# Patient Record
Sex: Male | Born: 1940 | Marital: Married | Smoking: Never smoker
Health system: Southern US, Community
[De-identification: ages and names within clinical notes are randomized; demographics above are authoritative.]

## PROBLEM LIST (undated history)

## (undated) DIAGNOSIS — I10 Essential (primary) hypertension: Secondary | ICD-10-CM

## (undated) DIAGNOSIS — I4891 Unspecified atrial fibrillation: Secondary | ICD-10-CM

## (undated) HISTORY — DX: Unspecified atrial fibrillation: I48.91

## (undated) HISTORY — PX: DENTAL SURGERY: SHX609

## (undated) HISTORY — DX: Essential (primary) hypertension: I10

---

## 2016-02-27 DIAGNOSIS — I340 Nonrheumatic mitral (valve) insufficiency: Secondary | ICD-10-CM | POA: Diagnosis not present

## 2016-03-03 DIAGNOSIS — I340 Nonrheumatic mitral (valve) insufficiency: Secondary | ICD-10-CM | POA: Diagnosis not present

## 2016-03-03 DIAGNOSIS — Z720 Tobacco use: Secondary | ICD-10-CM | POA: Diagnosis not present

## 2016-03-03 DIAGNOSIS — I482 Chronic atrial fibrillation: Secondary | ICD-10-CM | POA: Diagnosis not present

## 2016-03-26 DIAGNOSIS — Z23 Encounter for immunization: Secondary | ICD-10-CM | POA: Diagnosis not present

## 2016-08-08 DIAGNOSIS — K1329 Other disturbances of oral epithelium, including tongue: Secondary | ICD-10-CM | POA: Diagnosis not present

## 2016-08-15 DIAGNOSIS — K1379 Other lesions of oral mucosa: Secondary | ICD-10-CM | POA: Diagnosis not present

## 2016-08-15 DIAGNOSIS — K1329 Other disturbances of oral epithelium, including tongue: Secondary | ICD-10-CM | POA: Diagnosis not present

## 2016-09-02 DIAGNOSIS — D1801 Hemangioma of skin and subcutaneous tissue: Secondary | ICD-10-CM | POA: Diagnosis not present

## 2016-09-02 DIAGNOSIS — L814 Other melanin hyperpigmentation: Secondary | ICD-10-CM | POA: Diagnosis not present

## 2016-09-02 DIAGNOSIS — L739 Follicular disorder, unspecified: Secondary | ICD-10-CM | POA: Diagnosis not present

## 2016-09-02 DIAGNOSIS — L821 Other seborrheic keratosis: Secondary | ICD-10-CM | POA: Diagnosis not present

## 2016-09-29 DIAGNOSIS — E669 Obesity, unspecified: Secondary | ICD-10-CM | POA: Diagnosis not present

## 2016-09-29 DIAGNOSIS — I340 Nonrheumatic mitral (valve) insufficiency: Secondary | ICD-10-CM | POA: Diagnosis not present

## 2016-09-29 DIAGNOSIS — I482 Chronic atrial fibrillation: Secondary | ICD-10-CM | POA: Diagnosis not present

## 2016-10-27 DIAGNOSIS — I482 Chronic atrial fibrillation: Secondary | ICD-10-CM | POA: Diagnosis not present

## 2016-11-20 DIAGNOSIS — Z13220 Encounter for screening for lipoid disorders: Secondary | ICD-10-CM | POA: Diagnosis not present

## 2016-11-20 DIAGNOSIS — I482 Chronic atrial fibrillation: Secondary | ICD-10-CM | POA: Diagnosis not present

## 2016-11-20 DIAGNOSIS — Z125 Encounter for screening for malignant neoplasm of prostate: Secondary | ICD-10-CM | POA: Diagnosis not present

## 2016-11-20 DIAGNOSIS — Z0000 Encounter for general adult medical examination without abnormal findings: Secondary | ICD-10-CM | POA: Diagnosis not present

## 2016-12-04 DIAGNOSIS — Z23 Encounter for immunization: Secondary | ICD-10-CM | POA: Diagnosis not present

## 2017-02-10 DIAGNOSIS — Z23 Encounter for immunization: Secondary | ICD-10-CM | POA: Diagnosis not present

## 2017-04-20 DIAGNOSIS — E669 Obesity, unspecified: Secondary | ICD-10-CM | POA: Diagnosis not present

## 2017-04-20 DIAGNOSIS — I482 Chronic atrial fibrillation: Secondary | ICD-10-CM | POA: Diagnosis not present

## 2017-04-20 DIAGNOSIS — I340 Nonrheumatic mitral (valve) insufficiency: Secondary | ICD-10-CM | POA: Diagnosis not present

## 2017-04-27 DIAGNOSIS — I4891 Unspecified atrial fibrillation: Secondary | ICD-10-CM | POA: Diagnosis not present

## 2017-05-14 DIAGNOSIS — D3709 Neoplasm of uncertain behavior of other specified sites of the oral cavity: Secondary | ICD-10-CM | POA: Diagnosis not present

## 2017-05-20 DIAGNOSIS — K1329 Other disturbances of oral epithelium, including tongue: Secondary | ICD-10-CM | POA: Diagnosis not present

## 2017-10-14 DIAGNOSIS — I482 Chronic atrial fibrillation: Secondary | ICD-10-CM | POA: Diagnosis not present

## 2017-10-19 DIAGNOSIS — I340 Nonrheumatic mitral (valve) insufficiency: Secondary | ICD-10-CM | POA: Diagnosis not present

## 2017-10-19 DIAGNOSIS — E669 Obesity, unspecified: Secondary | ICD-10-CM | POA: Diagnosis not present

## 2017-10-19 DIAGNOSIS — I482 Chronic atrial fibrillation: Secondary | ICD-10-CM | POA: Diagnosis not present

## 2017-11-30 DIAGNOSIS — Z136 Encounter for screening for cardiovascular disorders: Secondary | ICD-10-CM | POA: Diagnosis not present

## 2017-11-30 DIAGNOSIS — Z0000 Encounter for general adult medical examination without abnormal findings: Secondary | ICD-10-CM | POA: Diagnosis not present

## 2017-11-30 DIAGNOSIS — I482 Chronic atrial fibrillation: Secondary | ICD-10-CM | POA: Diagnosis not present

## 2017-11-30 DIAGNOSIS — Z125 Encounter for screening for malignant neoplasm of prostate: Secondary | ICD-10-CM | POA: Diagnosis not present

## 2017-12-02 DIAGNOSIS — K044 Acute apical periodontitis of pulpal origin: Secondary | ICD-10-CM | POA: Diagnosis not present

## 2017-12-02 DIAGNOSIS — D3709 Neoplasm of uncertain behavior of other specified sites of the oral cavity: Secondary | ICD-10-CM | POA: Diagnosis not present

## 2017-12-09 DIAGNOSIS — F17290 Nicotine dependence, other tobacco product, uncomplicated: Secondary | ICD-10-CM | POA: Diagnosis not present

## 2017-12-09 DIAGNOSIS — K1379 Other lesions of oral mucosa: Secondary | ICD-10-CM | POA: Diagnosis not present

## 2017-12-09 DIAGNOSIS — M278 Other specified diseases of jaws: Secondary | ICD-10-CM | POA: Diagnosis not present

## 2017-12-09 DIAGNOSIS — Z85830 Personal history of malignant neoplasm of bone: Secondary | ICD-10-CM | POA: Diagnosis not present

## 2018-01-21 DIAGNOSIS — I340 Nonrheumatic mitral (valve) insufficiency: Secondary | ICD-10-CM | POA: Diagnosis not present

## 2018-01-21 DIAGNOSIS — F17220 Nicotine dependence, chewing tobacco, uncomplicated: Secondary | ICD-10-CM | POA: Diagnosis not present

## 2018-01-21 DIAGNOSIS — M279 Disease of jaws, unspecified: Secondary | ICD-10-CM | POA: Diagnosis not present

## 2018-01-21 DIAGNOSIS — I10 Essential (primary) hypertension: Secondary | ICD-10-CM | POA: Diagnosis not present

## 2018-01-21 DIAGNOSIS — C061 Malignant neoplasm of vestibule of mouth: Secondary | ICD-10-CM | POA: Diagnosis not present

## 2018-01-21 DIAGNOSIS — Z79899 Other long term (current) drug therapy: Secondary | ICD-10-CM | POA: Diagnosis not present

## 2018-01-21 DIAGNOSIS — C060 Malignant neoplasm of cheek mucosa: Secondary | ICD-10-CM | POA: Diagnosis not present

## 2018-01-21 DIAGNOSIS — I482 Chronic atrial fibrillation, unspecified: Secondary | ICD-10-CM | POA: Diagnosis not present

## 2018-01-21 DIAGNOSIS — D1039 Benign neoplasm of other parts of mouth: Secondary | ICD-10-CM | POA: Diagnosis not present

## 2018-01-21 DIAGNOSIS — Z7901 Long term (current) use of anticoagulants: Secondary | ICD-10-CM | POA: Diagnosis not present

## 2018-02-03 DIAGNOSIS — C760 Malignant neoplasm of head, face and neck: Secondary | ICD-10-CM | POA: Diagnosis not present

## 2018-02-03 DIAGNOSIS — Z483 Aftercare following surgery for neoplasm: Secondary | ICD-10-CM | POA: Diagnosis not present

## 2018-02-15 DIAGNOSIS — Z23 Encounter for immunization: Secondary | ICD-10-CM | POA: Diagnosis not present

## 2018-02-22 DIAGNOSIS — Z4889 Encounter for other specified surgical aftercare: Secondary | ICD-10-CM | POA: Diagnosis not present

## 2018-05-17 DIAGNOSIS — K1379 Other lesions of oral mucosa: Secondary | ICD-10-CM | POA: Diagnosis not present

## 2018-05-17 DIAGNOSIS — Z48817 Encounter for surgical aftercare following surgery on the skin and subcutaneous tissue: Secondary | ICD-10-CM | POA: Diagnosis not present

## 2018-05-17 DIAGNOSIS — Z9889 Other specified postprocedural states: Secondary | ICD-10-CM | POA: Diagnosis not present

## 2018-08-24 ENCOUNTER — Other Ambulatory Visit: Payer: Self-pay | Admitting: Cardiology

## 2018-08-24 DIAGNOSIS — I4821 Permanent atrial fibrillation: Secondary | ICD-10-CM

## 2018-08-24 NOTE — Telephone Encounter (Signed)
Please fill

## 2018-10-21 ENCOUNTER — Encounter: Payer: Self-pay | Admitting: Cardiology

## 2018-10-22 DIAGNOSIS — I340 Nonrheumatic mitral (valve) insufficiency: Secondary | ICD-10-CM | POA: Insufficient documentation

## 2018-10-22 DIAGNOSIS — I4821 Permanent atrial fibrillation: Secondary | ICD-10-CM | POA: Insufficient documentation

## 2018-10-22 DIAGNOSIS — E669 Obesity, unspecified: Secondary | ICD-10-CM | POA: Insufficient documentation

## 2018-10-22 NOTE — Progress Notes (Signed)
Subjective:  Primary Physician:  Farris Has, MD  Patient ID: Robert [name], male    DOB: 22-Jul-1940, 78 y.o.   MRN: [id number]  This visit type was conducted due to national recommendations for restrictions regarding the COVID-19 Pandemic (e.g. social distancing).  This format is felt to be most appropriate for this patient at this time.  All issues noted in this document were discussed and addressed.  No physical exam was performed (except for noted visual exam findings with Telehealth visits - very limited).  The patient has consented to conduct a Telehealth visit and understands insurance will be billed.   I connected with patient, on 10/25/18  by a video enabled telemedicine application and verified that I am speaking with the correct person using two identifiers.     I discussed the limitations of evaluation and management by telemedicine and the availability of in person appointments. The patient expressed understanding and agreed to proceed.   I have discussed with patient regarding the safety during COVID Pandemic and steps and precautions including social distancing with the patient.    Chief Complaint  Patient presents with   Atrial Fibrillation    6 months   HPI: Robert [name]  is a 78 y.o. male. Patient is a very pleasant white male. He has permanent atrial fibrillation. He was first diagnosed to have atrial fibrillation in 2009, when he had TIA (patient had sudden left hemianopia for one hour, no other neurological symptoms).Patient is essentially asymptomatic from cardiac point of view. He does not feel any palpitations or any spells of rapid heartbeat.No history of dizziness, near syncope or syncope. No chest pain, tightness or pressure. No history of shortness of breath, orthopnea or PND. No swelling on the legs. No history of leg claudication. No history of bleeding from any site and no history of excessive bruising. Patient is on Xarelto. He has been tolerating it  well.  No history of hypertension, diabetes or hypercholesterolemia. He does not smoke. He used to chew tobacco, quit in May 2018.  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 No history of thyroid problems.   Past Medical History:  Diagnosis Date   A-fib (HCC)    HTN (hypertension)     Past Surgical History:  Procedure Laterality Date   DENTAL SURGERY      Social History   Socioeconomic History   Marital status: Married    Spouse name: Not on file   Number of children: 2   Years of education: Not on file   Highest education level: Not on file  Occupational History   Not on file  Social Needs   Financial resource strain: Not on file   Food insecurity    Worry: Not on file    Inability: Not on file   Transportation needs    Medical: Not on file    Non-medical: Not on file  Tobacco Use   Smoking status: Never Smoker   Smokeless tobacco: Never Used  Substance and Sexual Activity   Alcohol use: Yes    Comment: occ   Drug use: Never   Sexual activity: Not on file  Lifestyle   Physical activity    Days per week: Not on file    Minutes per session: Not on file   Stress: Not on file  Relationships   Social connections    Talks on phone: Not on file    Gets  together: Not on file    Attends religious service: Not on file    Active member of club or organization: Not on file    Attends meetings of clubs or organizations: Not on file    Relationship status: Not on file   Intimate partner violence    Fear of current or ex partner: Not on file    Emotionally abused: Not on file    Physically abused: Not on file    Forced sexual activity: Not on file  Other Topics Concern   Not on file  Social History Narrative   Not on file    Current Outpatient Medications on File Prior to Visit  Medication Sig Dispense Refill   metoprolol tartrate (LOPRESSOR) 100 MG tablet Take 100 mg  by mouth 2 (two) times daily.     No current facility-administered medications on file prior to visit.     Review of Systems  Constitutional: Negative for fever.  HENT: Negative for nosebleeds.   Eyes: Negative for blurred vision.  Respiratory: Negative for cough and shortness of breath.   Cardiovascular: Negative for chest pain, palpitations and leg swelling.  Gastrointestinal: Negative for abdominal pain, nausea and vomiting.  Genitourinary: Negative for dysuria.  Musculoskeletal: Negative for myalgias.  Skin: Negative for itching and rash.  Neurological: Negative for dizziness, seizures and loss of consciousness.  Psychiatric/Behavioral: The patient is not nervous/anxious.       Objective:  Height 6\' 1"  (1.854 m), weight 239 lb (108.4 kg). Body mass index is 31.53 kg/m.  Physical Exam: Patient is alert and oriented. Well-built and nourished. Appeared very comfortable talking to me during the visit. No further detailed physical examination was possible as it was a telemedicine visit.  CARDIAC STUDIES:  Echocardiogram 10/14/2017: Left ventricle cavity is normal in size. Mild concentric hypertrophy of the left ventricle. Normal global wall motion. Unable to evaluate diastolic function due to A. Fibrillation. Calculated EF 56%. Left atrial cavity is moderately dilated. Moderate (Grade II) mitral regurgitation. Mild tricuspid regurgitation. Estimated pulmonary artery systolic pressure 27 mmHg. The aortic root is mildly dilated, measuring 3.9 cm in AP dimension. Compared to previous study on 02/26/2017, there is mild increase in aortic root dimension. Otherwise, no significant change.  Lexiscan myoview stress test 04/27/2017: 1. Pharmacologic stress testing was performed with intravenous administration of .4 mg of Lexiscan over a 10-15 seconds infusion. Stress EKG is non diagnostic for ischemia as it is a pharmacologic stress. 2. The overall quality of the study is good. Left  ventricular cavity is noted to be normal on the rest and stress studies. Gated SPECT images reveal normal myocardial thickening and wall motion. The left ventricular ejection fraction was calculated or visually estimated to be 68%. SPECT images demonstrate small perfusion abnormality of mild intensity in the mid inferior myocardial wall(s) on the stress images.  3. This is a low risk study.  EKG- 04/26/2018-  Atrial fibrillation with moderate ventricular response, low voltage in chest leads.  Assessment & Recommendations:   1. Permanent atrial fibrillation  2. Non-rheumatic mitral regurgitation  3. Obesity (BMI 30-39.9)  Laboratory Exam:  Lipid Panel   Recommendation:  Ventricular response is controlled with atrial fibrillation. I have advised him to continue present medications. Bleeding complications of Xarelto were again explained and patient, he was advised that, if there is any abnormal bleeding, like GI bleed, hematuria etc, he should stop the medicine and call us. He was also advised to avoid aspirin, ibuprofen or other NSAIDs.  Primary  prevention was again discussed with him. He was encouraged to continue calorie restriction to lose more weight. He was also advised to continue low-salt, low-cholesterol diet.  Return for cardiac follow-up after 6 months but call us earlier if there are any problems. Patient said he will have complete  blood tests at his PCP's office next month. He will have echocardiogram before the next office visit for follow-up of MR and aortic root size. Patient requested for the refill order for Xarelto and I have sent it to the pharmacy.  Earl Many, MD, [facility] 10/25/2018, 11:45 AM [facility] Pager: [phone] Office: [phone] If no answer Cell [phone]

## 2018-10-25 ENCOUNTER — Other Ambulatory Visit: Payer: Self-pay

## 2018-10-25 ENCOUNTER — Ambulatory Visit (INDEPENDENT_AMBULATORY_CARE_PROVIDER_SITE_OTHER): Payer: Medicare Other | Admitting: Cardiology

## 2018-10-25 ENCOUNTER — Encounter: Payer: Self-pay | Admitting: Cardiology

## 2018-10-25 VITALS — Ht 73.0 in | Wt 239.0 lb

## 2018-10-25 DIAGNOSIS — I4821 Permanent atrial fibrillation: Secondary | ICD-10-CM | POA: Diagnosis not present

## 2018-10-25 DIAGNOSIS — E669 Obesity, unspecified: Secondary | ICD-10-CM | POA: Diagnosis not present

## 2018-10-25 DIAGNOSIS — I340 Nonrheumatic mitral (valve) insufficiency: Secondary | ICD-10-CM

## 2018-10-25 MED ORDER — RIVAROXABAN 20 MG PO TABS: 20.0000 mg | ORAL_TABLET | Freq: Every day | ORAL | 2 refills | Status: DC

## 2018-12-08 DIAGNOSIS — Z0000 Encounter for general adult medical examination without abnormal findings: Secondary | ICD-10-CM | POA: Diagnosis not present

## 2018-12-08 DIAGNOSIS — Z125 Encounter for screening for malignant neoplasm of prostate: Secondary | ICD-10-CM | POA: Diagnosis not present

## 2018-12-08 DIAGNOSIS — R238 Other skin changes: Secondary | ICD-10-CM | POA: Diagnosis not present

## 2018-12-08 DIAGNOSIS — I482 Chronic atrial fibrillation, unspecified: Secondary | ICD-10-CM | POA: Diagnosis not present

## 2018-12-22 DIAGNOSIS — Z0000 Encounter for general adult medical examination without abnormal findings: Secondary | ICD-10-CM | POA: Diagnosis not present

## 2018-12-22 DIAGNOSIS — I482 Chronic atrial fibrillation, unspecified: Secondary | ICD-10-CM | POA: Diagnosis not present

## 2018-12-22 DIAGNOSIS — Z125 Encounter for screening for malignant neoplasm of prostate: Secondary | ICD-10-CM | POA: Diagnosis not present

## 2018-12-22 DIAGNOSIS — Z136 Encounter for screening for cardiovascular disorders: Secondary | ICD-10-CM | POA: Diagnosis not present

## 2019-01-22 DIAGNOSIS — Z23 Encounter for immunization: Secondary | ICD-10-CM | POA: Diagnosis not present

## 2019-04-26 ENCOUNTER — Other Ambulatory Visit: Payer: Self-pay

## 2019-04-26 ENCOUNTER — Ambulatory Visit (INDEPENDENT_AMBULATORY_CARE_PROVIDER_SITE_OTHER): Payer: Medicare Other

## 2019-04-26 DIAGNOSIS — I340 Nonrheumatic mitral (valve) insufficiency: Secondary | ICD-10-CM

## 2019-05-03 ENCOUNTER — Other Ambulatory Visit: Payer: Self-pay

## 2019-05-03 ENCOUNTER — Ambulatory Visit: Payer: Medicare Other | Admitting: Cardiology

## 2019-05-03 ENCOUNTER — Encounter: Payer: Self-pay | Admitting: Cardiology

## 2019-05-03 ENCOUNTER — Ambulatory Visit (INDEPENDENT_AMBULATORY_CARE_PROVIDER_SITE_OTHER): Payer: Medicare Other | Admitting: Cardiology

## 2019-05-03 VITALS — BP 110/67 | HR 87 | Temp 98.1°F | Resp 14 | Ht 73.0 in | Wt 242.8 lb

## 2019-05-03 DIAGNOSIS — I4821 Permanent atrial fibrillation: Secondary | ICD-10-CM

## 2019-05-03 DIAGNOSIS — I340 Nonrheumatic mitral (valve) insufficiency: Secondary | ICD-10-CM | POA: Diagnosis not present

## 2019-05-03 NOTE — Progress Notes (Signed)
Primary Physician:  Farris Has, MD   Patient ID: Robert [name], male    DOB: [birth year], 79 y.o.   MRN: [id number]  Subjective:    Chief Complaint  Patient presents with  . Follow-up    6 month  . Results    Echocardiogram    HPI: Robert [name]  is a 79 y.o. male  With chronic atrial fibrillation that was originally diagnosed in 2009 when he presented with TIA. He is asymptomatic from A fib. He now presents for 6 month follow up and to discuss recent echocardiogram.  Since last seen 6 months ago, patient is doing well. No complaints. Denies any chest pain, dyspnea, palpitations, leg edema, or symptoms of claudication or TIA.  No history of hypertension, diabetes or hypercholesterolemia. He does not smoke. He used to chew tobacco, quit in May 2018.  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 No history of thyroid problems. He retired a few years ago and reports losing 30 lbs after that, in which he has been able to maintain.  Past Medical History:  Diagnosis Date  . A-fib (HCC)   . HTN (hypertension)     Past Surgical History:  Procedure Laterality Date  . DENTAL SURGERY      Social History   Socioeconomic History  . Marital status: Married    Spouse name: Not on file  . Number of children: 2  . Years of education: Not on file  . Highest education level: Not on file  Occupational History  . Not on file  Tobacco Use  . Smoking status: Never Smoker  . Smokeless tobacco: Never Used  Substance and Sexual Activity  . Alcohol use: Yes    Comment: occ  . Drug use: Never  . Sexual activity: Not on file  Other Topics Concern  . Not on file  Social History Narrative  . Not on file   Social Determinants of Health   Financial Resource Strain:   . Difficulty of Paying Living Expenses: Not on file  Food Insecurity:   . Worried About Programme researcher, broadcasting/film/video in the Last Year: Not on file  . Ran  Out of Food in the Last Year: Not on file  Transportation Needs:   . Lack of Transportation (Medical): Not on file  . Lack of Transportation (Non-Medical): Not on file  Physical Activity:   . Days of Exercise per Week: Not on file  . Minutes of Exercise per Session: Not on file  Stress:   . Feeling of Stress : Not on file  Social Connections:   . Frequency of Communication with Friends and Family: Not on file  . Frequency of Social Gatherings with Friends and Family: Not on file  . Attends Religious Services: Not on file  . Active Member of Clubs or Organizations: Not on file  . Attends Banker Meetings: Not on file  . Marital Status: Not on file  Intimate Partner Violence:   . Fear of Current or Ex-Partner: Not on file  . Emotionally Abused: Not on file  . Physically Abused: Not on file  . Sexually Abused: Not on file    Review of Systems  Constitution: Negative for decreased appetite, malaise/fatigue, weight gain and weight loss.  Eyes: Negative for visual disturbance.  Cardiovascular: Negative for chest pain, claudication, dyspnea on exertion, leg swelling, orthopnea, palpitations and syncope.  Respiratory: Negative for hemoptysis and wheezing.  Endocrine: Negative for cold intolerance and heat intolerance.  Hematologic/Lymphatic: Does not bruise/bleed easily.  Skin: Negative for nail changes.  Musculoskeletal: Negative for muscle weakness and myalgias.  Gastrointestinal: Negative for abdominal pain, change in bowel habit, nausea and vomiting.  Neurological: Negative for difficulty with concentration, dizziness, focal weakness and headaches.  Psychiatric/Behavioral: Negative for altered mental status and suicidal ideas.  All other systems reviewed and are negative.     Objective:  Blood pressure 110/67, pulse 87, temperature 98.1 F (36.7 C), temperature source Temporal, resp. rate 14, height 6\' 1"  (1.854 m), weight 242 lb 12.8 oz (110.1 kg), SpO2 96 %. Body  mass index is 32.03 kg/m.    Physical Exam  Constitutional: He is oriented to person, place, and time. Vital signs are normal. He appears well-developed and well-nourished.  HENT:  Head: Normocephalic and atraumatic.  Cardiovascular: Normal rate, normal heart sounds and intact distal pulses. An irregularly irregular rhythm present.  Pulmonary/Chest: Effort normal and breath sounds normal. No accessory muscle usage. No respiratory distress.  Abdominal: Soft. Bowel sounds are normal.  Musculoskeletal:        General: Normal range of motion.     Cervical back: Normal range of motion.  Neurological: He is alert and oriented to person, place, and time.  Skin: Skin is warm and dry.  Vitals reviewed.  Radiology: No results found.  Laboratory examination:    No flowsheet data found. No flowsheet data found. Lipid Panel  No results found for: CHOL, TRIG, HDL, CHOLHDL, VLDL, LDLCALC, LDLDIRECT HEMOGLOBIN A1C No results found for: HGBA1C, MPG TSH No results for input(s): TSH in the last 8760 hours.  PRN Meds:. There are no discontinued medications. Current Meds  Medication Sig  . metoprolol tartrate (LOPRESSOR) 100 MG tablet Take 100 mg by mouth 2 (two) times daily.  . rivaroxaban (XARELTO) 20 MG TABS tablet Take 1 tablet (20 mg total) by mouth daily.    Cardiac Studies:   Echocardiogram 04/26/2019: Left ventricle cavity is normal in size. Moderate concentric hypertrophy of the left ventricle. Normal global wall motion. Normal LV systolic function with visual EF 50-55%. Unable to evaluate diastolic function due to atrial fibrillation.  Left atrial cavity is severely dilated. Trileaflet aortic valve.  Mild (Grade I) aortic regurgitation. Moderate (Grade III) mitral regurgitation. Mild tricuspid regurgitation.  No evidence of pulmonary hypertension. No significant change compared to previous study on 10/14/2017.   Lexiscan myoview stress test 04/27/2017: 1. Pharmacologic stress  testing was performed with intravenous administration of .4 mg of Lexiscan over a 10-15 seconds infusion. Stress EKG is non diagnostic for ischemia as it is a pharmacologic stress. 2. The overall quality of the study is good. Left ventricular cavity is noted to be normal on the rest and stress studies. Gated SPECT images reveal normal myocardial thickening and wall motion. The left ventricular ejection fraction was calculated or visually estimated to be 68%. SPECT images demonstrate small perfusion abnormality of mild intensity in the mid inferior myocardial wall(s) on the stress images.  3. This is a low risk study.  Assessment:   Permanent atrial fibrillation (HCC) - Plan: EKG 12-Lead  Moderate mitral regurgitation  EKG- 05/03/2019-  Atrial fibrillation with controlled ventricular response at 91 bpm, normal axis, IRBBB. Low voltage complexes.   Recommendations:   Patient is doing well without any complaints today.  States that he is feeling well.  Tolerating medications well.  I discussed recently obtained echocardiogram findings that shows stable moderate MR.  Normal LVEF.  Aortic root size is essentially normal at 3.9 cm, recommend repeat echocardiogram again in 2 years.  His echo has been stable compared to previous echo in 2019.  No bleeding diathesis reported on anticoagulation.  He follows with Dr. Kateri Plummer annually and states labs have been stable.  Congratulated him on maintaining 30 pound weight loss and encouraged him to continue with this.  I will see him back in 1 year or sooner if problems.  Toniann Fail, MSN, APRN, FNP-C [facility] Office: [phone] Fax: [phone]

## 2019-08-18 ENCOUNTER — Other Ambulatory Visit: Payer: Self-pay | Admitting: Cardiology

## 2019-08-18 DIAGNOSIS — I4821 Permanent atrial fibrillation: Secondary | ICD-10-CM

## 2019-12-20 DIAGNOSIS — Z136 Encounter for screening for cardiovascular disorders: Secondary | ICD-10-CM | POA: Diagnosis not present

## 2019-12-20 DIAGNOSIS — Z0000 Encounter for general adult medical examination without abnormal findings: Secondary | ICD-10-CM | POA: Diagnosis not present

## 2019-12-20 DIAGNOSIS — I4891 Unspecified atrial fibrillation: Secondary | ICD-10-CM | POA: Diagnosis not present

## 2019-12-21 DIAGNOSIS — I482 Chronic atrial fibrillation, unspecified: Secondary | ICD-10-CM | POA: Diagnosis not present

## 2019-12-21 DIAGNOSIS — R030 Elevated blood-pressure reading, without diagnosis of hypertension: Secondary | ICD-10-CM | POA: Diagnosis not present

## 2019-12-21 DIAGNOSIS — Z0000 Encounter for general adult medical examination without abnormal findings: Secondary | ICD-10-CM | POA: Diagnosis not present

## 2020-01-17 DIAGNOSIS — Z23 Encounter for immunization: Secondary | ICD-10-CM | POA: Diagnosis not present

## 2020-02-14 DIAGNOSIS — Z23 Encounter for immunization: Secondary | ICD-10-CM | POA: Diagnosis not present

## 2020-05-01 NOTE — Progress Notes (Signed)
Primary Physician:  Farris Has, MD   Patient ID: Robert [name], male    DOB: [birth year], 80 y.o.   MRN: [id number]  Subjective:    Chief Complaint  Patient presents with  . Atrial Fibrillation  . Moderate MR  . Follow-up    1 year    HPI: Robert [name]  is a 80 y.o. male  with permanent atrial fibrillation that was originally diagnosed in 2009 when he presented with TIA.  Past medical history significant for hypertension.  He used to chew tobacco, quit in May 2018.  He is asymptomatic from A fib. He now presents for 6 month follow up.   He has been active to COVID-19, has been using his RV to make small trips and has maintained social distancing and has also been vaccinated.  He also plays golf once a week.  Denies any chest pain, shortness of breath.    Past Medical History:  Diagnosis Date  . A-fib (HCC)   . HTN (hypertension)     Past Surgical History:  Procedure Laterality Date  . DENTAL SURGERY      Social History   Tobacco Use  . Smoking status: Never Smoker  . Smokeless tobacco: Never Used  Substance Use Topics  . Alcohol use: Yes    Comment: occ  Marital Sttus: Married    Review of Systems  Constitutional: Negative for decreased appetite, malaise/fatigue, weight gain and weight loss.  Eyes: Negative for visual disturbance.  Cardiovascular: Negative for chest pain, claudication, dyspnea on exertion, leg swelling, orthopnea, palpitations and syncope.  Respiratory: Negative for hemoptysis and wheezing.   Endocrine: Negative for cold intolerance and heat intolerance.  Hematologic/Lymphatic: Does not bruise/bleed easily.  Skin: Negative for nail changes.  Musculoskeletal: Negative for muscle weakness and myalgias.  Gastrointestinal: Negative for abdominal pain, change in bowel habit, nausea and vomiting.  Neurological: Negative for difficulty with concentration, dizziness, focal weakness and headaches.  Psychiatric/Behavioral: Negative for altered  mental status and suicidal ideas.  All other systems reviewed and are negative.   Objective:  Blood pressure 118/69, pulse 89, resp. rate 16, height 6\' 1"  (1.854 m), weight 240 lb 3.2 oz (109 kg), SpO2 97 %. Body mass index is 31.69 kg/m.  Vitals with BMI 05/02/2020 05/03/2019 10/25/2018  Height 6\' 1"  6\' 1"  6\' 1"   Weight 240 lbs 3 oz 242 lbs 13 oz 239 lbs  BMI 31.7 32.04 31.54  Systolic 118 110 -  Diastolic 69 67 -  Pulse 89 87 -       Physical Exam Vitals reviewed.  Constitutional:      Appearance: He is well-developed. He is obese.  HENT:     Head: Normocephalic and atraumatic.  Cardiovascular:     Rate and Rhythm: Normal rate. Rhythm irregular.     Pulses: Normal pulses and intact distal pulses.     Heart sounds: Normal heart sounds.  Pulmonary:     Effort: Pulmonary effort is normal. No accessory muscle usage or respiratory distress.     Breath sounds: Normal breath sounds.  Abdominal:     General: Bowel sounds are normal.     Palpations: Abdomen is soft.  Musculoskeletal:        General: Normal range of motion.     Cervical back: Normal range of motion.  Skin:    General: Skin is warm and dry.  Neurological:     Mental Status: He is alert and oriented to person, place, and time.    Radiology:  No results found.  Laboratory examination:   External labs:   Labs 12/20/2019:  Hb 13.9/HCT 40.8, platelets 195.  BUN 13, creatinine 0.99, EGFR >65 mL.  CMP otherwise normal.  Total cholesterol 137, triglycerides 79, HDL 45, LDL 77.  Non-HDL cholesterol 93.  No Known Allergies   Current Outpatient Medications on File Prior to Visit  Medication Sig Dispense Refill  . metoprolol tartrate (LOPRESSOR) 100 MG tablet Take 100 mg by mouth 2 (two) times daily.     No current facility-administered medications on file prior to visit.     Cardiac Studies:   Echocardiogram 04/26/2019: Left ventricle cavity is normal in size. Moderate concentric hypertrophy of the left  ventricle. Normal global wall motion. Normal LV systolic function with visual EF 50-55%. Unable to evaluate diastolic function due to atrial fibrillation.  Left atrial cavity is severely dilated. Trileaflet aortic valve.  Mild (Grade I) aortic regurgitation. Moderate (Grade III) mitral regurgitation. Mild tricuspid regurgitation.  No evidence of pulmonary hypertension. No significant change compared to previous study on 10/14/2017.  Lexiscan myoview stress test 04/27/2017: 1. Pharmacologic stress testing was performed with intravenous administration of .4 mg of Lexiscan over a 10-15 seconds infusion. Stress EKG is non diagnostic for ischemia as it is a pharmacologic stress. 2. The overall quality of the study is good. Left ventricular cavity is noted to be normal on the rest and stress studies. Gated SPECT images reveal normal myocardial thickening and wall motion. The left ventricular ejection fraction was calculated or visually estimated to be 68%. SPECT images demonstrate small perfusion abnormality of mild intensity in the mid inferior myocardial wall(s) on the stress images.  3. This is a low risk study.  EKG:   EKG 05/02/2020: Atrial fibrillation with controlled ventricular response at rate of 84 bpm, normal axis.  Incomplete right bundle branch block.  Poor R wave progression, probably normal variant.  Low voltage.  No evidence of ischemia, normal QT interval.  Pulmonary disease pattern. No significant change from  EKG- 05/03/2019.  Assessment:   Permanent atrial fibrillation (HCC) - Plan: EKG 12-Lead, rivaroxaban (XARELTO) 20 MG TABS tablet, PCV ECHOCARDIOGRAM COMPLETE  Moderate mitral regurgitation - Plan: PCV ECHOCARDIOGRAM COMPLETE  Primary hypertension  CHA2DS2-VASc Score is 5.  Yearly risk of stroke: 7.2% (A, HTN, Prior CVA).  Score of 1=0.6; 2=2.2; 3=3.2; 4=4.8; 5=7.2; 6=9.8; 7=>9.8) -(CHF; HTN; vasc disease DM,  Male = 1; Age <65 =0; 65-74 = 1,  >75 =2; stroke/embolism=  2).    Meds ordered this encounter  Medications  . rivaroxaban (XARELTO) 20 MG TABS tablet    Sig: Take 1 tablet (20 mg total) by mouth daily with supper.    Dispense:  90 tablet    Refill:  3   Medications Discontinued During This Encounter  Medication Reason  . XARELTO 20 MG TABS tablet Reorder      Recommendations:   Robert [name] is a 80 y.o. with permanent atrial fibrillation that was originally diagnosed in 2009 when he presented with TIA.  Past medical history significant for hypertension.  He used to chew tobacco, quit in May 2018.  He is asymptomatic from A fib. He now presents for 1 year follow-up.  I reviewed his external labs, renal function is normal, CBC has maintained normalcy as well and he has not had any bleeding diathesis on Xarelto.  His blood pressure is also well controlled and heart rate is well controlled.  No changes in the medications were done by me today, he  does have underlying moderate mitral regurgitation, will repeat echocardiogram in 1 year and see him back at that time.    Yates Decamp, MD, [facility] 05/02/2020, 9:38 AM Office: [phone] Pager: [phone]

## 2020-05-02 ENCOUNTER — Ambulatory Visit: Payer: Medicare Other | Admitting: Cardiology

## 2020-05-02 ENCOUNTER — Encounter: Payer: Self-pay | Admitting: Cardiology

## 2020-05-02 ENCOUNTER — Other Ambulatory Visit: Payer: Self-pay

## 2020-05-02 VITALS — BP 118/69 | HR 89 | Resp 16 | Ht 73.0 in | Wt 240.2 lb

## 2020-05-02 DIAGNOSIS — I10 Essential (primary) hypertension: Secondary | ICD-10-CM

## 2020-05-02 DIAGNOSIS — I340 Nonrheumatic mitral (valve) insufficiency: Secondary | ICD-10-CM

## 2020-05-02 DIAGNOSIS — I4821 Permanent atrial fibrillation: Secondary | ICD-10-CM | POA: Diagnosis not present

## 2020-05-02 MED ORDER — RIVAROXABAN 20 MG PO TABS: 20.0000 mg | ORAL_TABLET | Freq: Every day | ORAL | 3 refills | Status: DC

## 2020-08-08 DIAGNOSIS — F17200 Nicotine dependence, unspecified, uncomplicated: Secondary | ICD-10-CM | POA: Diagnosis not present

## 2020-08-08 DIAGNOSIS — M279 Disease of jaws, unspecified: Secondary | ICD-10-CM | POA: Diagnosis not present

## 2020-08-08 DIAGNOSIS — C048 Malignant neoplasm of overlapping sites of floor of mouth: Secondary | ICD-10-CM | POA: Diagnosis not present

## 2020-08-08 DIAGNOSIS — M278 Other specified diseases of jaws: Secondary | ICD-10-CM | POA: Diagnosis not present

## 2020-08-08 DIAGNOSIS — C148 Malignant neoplasm of overlapping sites of lip, oral cavity and pharynx: Secondary | ICD-10-CM | POA: Diagnosis not present

## 2020-08-15 DIAGNOSIS — C049 Malignant neoplasm of floor of mouth, unspecified: Secondary | ICD-10-CM | POA: Diagnosis not present

## 2020-08-15 DIAGNOSIS — R911 Solitary pulmonary nodule: Secondary | ICD-10-CM | POA: Diagnosis not present

## 2020-08-15 DIAGNOSIS — C048 Malignant neoplasm of overlapping sites of floor of mouth: Secondary | ICD-10-CM | POA: Diagnosis not present

## 2020-08-15 DIAGNOSIS — J64 Unspecified pneumoconiosis: Secondary | ICD-10-CM | POA: Diagnosis not present

## 2020-08-15 DIAGNOSIS — C069 Malignant neoplasm of mouth, unspecified: Secondary | ICD-10-CM | POA: Diagnosis not present

## 2020-08-15 DIAGNOSIS — C969 Malignant neoplasm of lymphoid, hematopoietic and related tissue, unspecified: Secondary | ICD-10-CM | POA: Diagnosis not present

## 2020-08-22 DIAGNOSIS — C770 Secondary and unspecified malignant neoplasm of lymph nodes of head, face and neck: Secondary | ICD-10-CM | POA: Diagnosis not present

## 2020-08-22 DIAGNOSIS — C048 Malignant neoplasm of overlapping sites of floor of mouth: Secondary | ICD-10-CM | POA: Diagnosis not present

## 2020-08-22 DIAGNOSIS — C779 Secondary and unspecified malignant neoplasm of lymph node, unspecified: Secondary | ICD-10-CM | POA: Diagnosis not present

## 2020-08-22 DIAGNOSIS — Z87891 Personal history of nicotine dependence: Secondary | ICD-10-CM | POA: Diagnosis not present

## 2020-08-28 DIAGNOSIS — I10 Essential (primary) hypertension: Secondary | ICD-10-CM | POA: Diagnosis not present

## 2020-08-28 DIAGNOSIS — Z23 Encounter for immunization: Secondary | ICD-10-CM | POA: Diagnosis not present

## 2020-08-28 DIAGNOSIS — Z87891 Personal history of nicotine dependence: Secondary | ICD-10-CM | POA: Diagnosis not present

## 2020-08-28 DIAGNOSIS — D696 Thrombocytopenia, unspecified: Secondary | ICD-10-CM | POA: Diagnosis not present

## 2020-08-28 DIAGNOSIS — I959 Hypotension, unspecified: Secondary | ICD-10-CM | POA: Diagnosis not present

## 2020-08-28 DIAGNOSIS — C001 Malignant neoplasm of external lower lip: Secondary | ICD-10-CM | POA: Diagnosis not present

## 2020-08-28 DIAGNOSIS — Z79899 Other long term (current) drug therapy: Secondary | ICD-10-CM | POA: Diagnosis not present

## 2020-08-28 DIAGNOSIS — Z8673 Personal history of transient ischemic attack (TIA), and cerebral infarction without residual deficits: Secondary | ICD-10-CM | POA: Diagnosis not present

## 2020-08-28 DIAGNOSIS — C770 Secondary and unspecified malignant neoplasm of lymph nodes of head, face and neck: Secondary | ICD-10-CM | POA: Diagnosis not present

## 2020-08-28 DIAGNOSIS — I340 Nonrheumatic mitral (valve) insufficiency: Secondary | ICD-10-CM | POA: Diagnosis not present

## 2020-08-28 DIAGNOSIS — I4821 Permanent atrial fibrillation: Secondary | ICD-10-CM | POA: Diagnosis not present

## 2020-08-28 DIAGNOSIS — D62 Acute posthemorrhagic anemia: Secondary | ICD-10-CM | POA: Diagnosis not present

## 2020-08-28 DIAGNOSIS — C048 Malignant neoplasm of overlapping sites of floor of mouth: Secondary | ICD-10-CM | POA: Diagnosis not present

## 2020-08-30 DIAGNOSIS — C009 Malignant neoplasm of lip, unspecified: Secondary | ICD-10-CM | POA: Diagnosis not present

## 2020-08-30 DIAGNOSIS — I4891 Unspecified atrial fibrillation: Secondary | ICD-10-CM | POA: Diagnosis not present

## 2020-08-30 DIAGNOSIS — Z9189 Other specified personal risk factors, not elsewhere classified: Secondary | ICD-10-CM | POA: Diagnosis not present

## 2020-08-30 DIAGNOSIS — Z01818 Encounter for other preprocedural examination: Secondary | ICD-10-CM | POA: Diagnosis not present

## 2020-08-30 DIAGNOSIS — C069 Malignant neoplasm of mouth, unspecified: Secondary | ICD-10-CM | POA: Diagnosis not present

## 2020-09-03 DIAGNOSIS — Z79899 Other long term (current) drug therapy: Secondary | ICD-10-CM | POA: Diagnosis not present

## 2020-09-03 DIAGNOSIS — D62 Acute posthemorrhagic anemia: Secondary | ICD-10-CM | POA: Diagnosis not present

## 2020-09-03 DIAGNOSIS — I10 Essential (primary) hypertension: Secondary | ICD-10-CM | POA: Diagnosis not present

## 2020-09-03 DIAGNOSIS — I959 Hypotension, unspecified: Secondary | ICD-10-CM | POA: Diagnosis not present

## 2020-09-03 DIAGNOSIS — C001 Malignant neoplasm of external lower lip: Secondary | ICD-10-CM | POA: Diagnosis present

## 2020-09-03 DIAGNOSIS — C770 Secondary and unspecified malignant neoplasm of lymph nodes of head, face and neck: Secondary | ICD-10-CM | POA: Diagnosis not present

## 2020-09-03 DIAGNOSIS — C069 Malignant neoplasm of mouth, unspecified: Secondary | ICD-10-CM | POA: Diagnosis not present

## 2020-09-03 DIAGNOSIS — I4891 Unspecified atrial fibrillation: Secondary | ICD-10-CM | POA: Diagnosis not present

## 2020-09-03 DIAGNOSIS — Z8673 Personal history of transient ischemic attack (TIA), and cerebral infarction without residual deficits: Secondary | ICD-10-CM | POA: Diagnosis not present

## 2020-09-03 DIAGNOSIS — Z87891 Personal history of nicotine dependence: Secondary | ICD-10-CM | POA: Diagnosis not present

## 2020-09-03 DIAGNOSIS — C792 Secondary malignant neoplasm of skin: Secondary | ICD-10-CM | POA: Diagnosis not present

## 2020-09-03 DIAGNOSIS — C080 Malignant neoplasm of submandibular gland: Secondary | ICD-10-CM | POA: Diagnosis not present

## 2020-09-03 DIAGNOSIS — C048 Malignant neoplasm of overlapping sites of floor of mouth: Secondary | ICD-10-CM | POA: Diagnosis present

## 2020-09-03 DIAGNOSIS — I340 Nonrheumatic mitral (valve) insufficiency: Secondary | ICD-10-CM | POA: Diagnosis present

## 2020-09-03 DIAGNOSIS — D696 Thrombocytopenia, unspecified: Secondary | ICD-10-CM | POA: Diagnosis not present

## 2020-09-03 DIAGNOSIS — C004 Malignant neoplasm of lower lip, inner aspect: Secondary | ICD-10-CM | POA: Diagnosis not present

## 2020-09-03 DIAGNOSIS — I4821 Permanent atrial fibrillation: Secondary | ICD-10-CM | POA: Diagnosis not present

## 2020-09-03 DIAGNOSIS — K029 Dental caries, unspecified: Secondary | ICD-10-CM | POA: Diagnosis not present

## 2020-09-03 DIAGNOSIS — C411 Malignant neoplasm of mandible: Secondary | ICD-10-CM | POA: Diagnosis not present

## 2020-09-03 DIAGNOSIS — Z4659 Encounter for fitting and adjustment of other gastrointestinal appliance and device: Secondary | ICD-10-CM | POA: Diagnosis not present

## 2020-09-03 DIAGNOSIS — C039 Malignant neoplasm of gum, unspecified: Secondary | ICD-10-CM | POA: Diagnosis not present

## 2020-09-18 DIAGNOSIS — R1312 Dysphagia, oropharyngeal phase: Secondary | ICD-10-CM | POA: Diagnosis not present

## 2020-09-18 DIAGNOSIS — Z87891 Personal history of nicotine dependence: Secondary | ICD-10-CM | POA: Diagnosis not present

## 2020-09-18 DIAGNOSIS — C048 Malignant neoplasm of overlapping sites of floor of mouth: Secondary | ICD-10-CM | POA: Diagnosis not present

## 2020-09-19 ENCOUNTER — Ambulatory Visit
Admission: RE | Admit: 2020-09-19 | Discharge: 2020-09-19 | Disposition: A | Discharge status: Home / Self Care | Payer: Self-pay | Source: Ambulatory Visit | Attending: Radiation Oncology | Admitting: Radiation Oncology

## 2020-09-19 ENCOUNTER — Other Ambulatory Visit: Payer: Self-pay

## 2020-09-19 ENCOUNTER — Other Ambulatory Visit: Payer: Self-pay | Admitting: Radiation Oncology

## 2020-09-19 ENCOUNTER — Encounter: Payer: Self-pay | Admitting: Cardiology

## 2020-09-19 ENCOUNTER — Ambulatory Visit: Payer: Medicare Other | Admitting: Cardiology

## 2020-09-19 VITALS — BP 101/53 | HR 74 | Temp 97.0°F | Resp 16 | Ht 73.0 in | Wt 232.4 lb

## 2020-09-19 DIAGNOSIS — I4821 Permanent atrial fibrillation: Secondary | ICD-10-CM

## 2020-09-19 DIAGNOSIS — C069 Malignant neoplasm of mouth, unspecified: Secondary | ICD-10-CM

## 2020-09-19 DIAGNOSIS — I340 Nonrheumatic mitral (valve) insufficiency: Secondary | ICD-10-CM | POA: Diagnosis not present

## 2020-09-19 DIAGNOSIS — I10 Essential (primary) hypertension: Secondary | ICD-10-CM

## 2020-09-19 MED ORDER — RIVAROXABAN 20 MG PO TABS: 20.0000 mg | ORAL_TABLET | Freq: Every day | ORAL | 3 refills | Status: DC

## 2020-09-19 NOTE — Progress Notes (Signed)
Primary Physician:  Farris Has, MD   Patient ID: Robert [name], male    DOB: 08-20-1940, 80 y.o.   MRN: [id number]  Subjective:    Chief Complaint  Patient presents with  . Atrial Fibrillation  . Hospitalization Follow-up    HPI: Robert [name]  is a 80 y.o. male  with permanent atrial fibrillation that was originally diagnosed in 2009 when he presented with TIA.  Past medical history significant for hypertension.  He used to chew tobacco, quit in May 2018.  Patient has had malignancy of the head and neck and underwent extensive surgery and resection of part of the mandible and also his left and had bone graft and skin graft performed 09/03/2020 at [facility]  He is recuperating well, presently not taking Xarelto and awaiting my office visit.  He is asymptomatic from A fib.  Metoprolol dose was reduced due to low blood pressure although patient has no dizziness or syncope.  Past Medical History:  Diagnosis Date  . A-fib (HCC)   . HTN (hypertension)     Past Surgical History:  Procedure Laterality Date  . DENTAL SURGERY      Social History   Tobacco Use  . Smoking status: Not on file  . Smokeless tobacco: Former Neurosurgeon    Types: Chew  Substance Use Topics  . Alcohol use: Yes    Comment: occ  Marital Sttus: Married    Review of Systems  HENT:       Extensive skin and bone graft to his chin and jaw and neck.  Cardiovascular: Negative for chest pain, dyspnea on exertion and leg swelling.  Gastrointestinal: Negative for melena.    Objective:  Blood pressure (!) 101/53, pulse 74, temperature (!) 97 F (36.1 C), temperature source Temporal, resp. rate 16, height 6\' 1"  (1.854 m), weight 232 lb 6.4 oz (105.4 kg), SpO2 97 %. Body mass index is 30.66 kg/m.  Vitals with BMI 09/19/2020 09/19/2020 05/02/2020  Height - 6\' 1"  6\' 1"   Weight - 232 lbs 6 oz 240 lbs 3 oz  BMI - 30.67 31.7  Systolic 101 71 118  Diastolic 53 43 69  Pulse 74 73 89       Physical  Exam Vitals reviewed.  Constitutional:      Appearance: He is well-developed. He is obese.  HENT:     Head:     Comments: Surgical reconstruction of his jaw and neck noted. Cardiovascular:     Rate and Rhythm: Tachycardia present. Rhythm irregular.     Pulses: Normal pulses and intact distal pulses.     Heart sounds: Normal heart sounds.  Pulmonary:     Effort: Pulmonary effort is normal. No accessory muscle usage or respiratory distress.     Breath sounds: Normal breath sounds.  Abdominal:     General: Bowel sounds are normal.     Palpations: Abdomen is soft.  Musculoskeletal:        General: Swelling (bilateral 1-2 + pitting edema) present. Normal range of motion.     Cervical back: Normal range of motion.  Skin:    General: Skin is warm and dry.  Neurological:     Mental Status: He is alert and oriented to person, place, and time.    Radiology: No results found.  Laboratory examination:   External labs:  Lab 12/20/2019:  Hb 13.9/HCT 40.8, platelets 195.  Serum glucose 96 mg, BUN 13, creatinine 0.99, EGFR 73/89 mL, sodium 142, potassium 4.2, CMP otherwise normal.  Total cholesterol 137, triglycerides 79, HDL 45, LDL 77.   No Known Allergies   Current Outpatient Medications on File Prior to Visit  Medication Sig Dispense Refill  . acetaminophen (TYLENOL) 500 MG tablet Take 1-2 tablets by mouth as needed for pain.    Marland Kitchen gabapentin (NEURONTIN) 100 MG capsule Take 100 mg by mouth 3 (three) times daily.    . Metoprolol Tartrate 75 MG TABS Take 75 mg by mouth 2 (two) times daily.     No current facility-administered medications on file prior to visit.     Cardiac Studies:   Echocardiogram 04/26/2019: Left ventricle cavity is normal in size. Moderate concentric hypertrophy of the left ventricle. Normal global wall motion. Normal LV systolic function with visual EF 50-55%. Unable to evaluate diastolic function due to atrial fibrillation.  Left atrial cavity is severely  dilated. Trileaflet aortic valve.  Mild (Grade I) aortic regurgitation. Moderate (Grade III) mitral regurgitation. Mild tricuspid regurgitation.  No evidence of pulmonary hypertension. No significant change compared to previous study on 10/14/2017.  Lexiscan myoview stress test 04/27/2017: 1. Pharmacologic stress testing was performed with intravenous administration of .4 mg of Lexiscan over a 10-15 seconds infusion. Stress EKG is non diagnostic for ischemia as it is a pharmacologic stress. 2. The overall quality of the study is good. Left ventricular cavity is noted to be normal on the rest and stress studies. Gated SPECT images reveal normal myocardial thickening and wall motion. The left ventricular ejection fraction was calculated or visually estimated to be 68%. SPECT images demonstrate small perfusion abnormality of mild intensity in the mid inferior myocardial wall(s) on the stress images.  3. This is a low risk study.  EKG:   EKG 09/19/2020: Atrial fibrillation with controlled ventricular response at the rate of 75-101 bpm, normal axis, incomplete right bundle branch block.  Poor R wave progression, probably normal variant.  Low-voltage complexes.  No significant change from 05/02/2020.  Assessment:     ICD-10-CM   1. Permanent atrial fibrillation (HCC)  I48.21 EKG 12-Lead    rivaroxaban (XARELTO) 20 MG TABS tablet  2. Moderate mitral regurgitation  I34.0   3. Primary hypertension  I10     CHA2DS2-VASc Score is 5.  Yearly risk of stroke: 7.2% (A, HTN, Prior CVA).  Score of 1=0.6; 2=2.2; 3=3.2; 4=4.8; 5=7.2; 6=9.8; 7=>9.8) -(CHF; HTN; vasc disease DM,  Male = 1; Age <65 =0; 65-74 = 1,  >75 =2; stroke/embolism= 2).    Meds ordered this encounter  Medications  . rivaroxaban (XARELTO) 20 MG TABS tablet    Sig: Take 1 tablet (20 mg total) by mouth daily with supper.    Dispense:  90 tablet    Refill:  3   Medications Discontinued During This Encounter  Medication Reason   . aspirin 325 MG tablet Discontinued by provider  . rivaroxaban (XARELTO) 20 MG TABS tablet Reorder     Current Outpatient Medications  Medication Instructions  . acetaminophen (TYLENOL) 500 MG tablet 1-2 tablets, Oral, As needed  . gabapentin (NEURONTIN) 100 mg, Oral, 3 times daily  . Metoprolol Tartrate 75 mg, Oral, 2 times daily  . rivaroxaban (XARELTO) 20 mg, Oral, Daily with supper    Recommendations:   Robert [name] is a 80 y.o. with permanent atrial fibrillation that was originally diagnosed in 2009 when he presented with TIA.  Past medical history significant for hypertension.  He used to chew tobacco, quit in May 2018.  Patient has had malignancy of the head and  neck and underwent extensive surgery and resection of part of the mandible and also his left and had bone graft and skin graft performed 09/03/2020 at [facility].  He is recuperating well, presently not taking Xarelto and awaiting my office visit.  With regard to anticoagulation, due to high cardioembolic risk, would recommend that he restart Xarelto.  There is no bleeding issues now and surgery has been stable 6 weeks ago.  With regard to A. fib with RVR, it is related to reducing the dose of metoprolol tartarate from 100 mg to 75 mg BID, as he has borderline low blood pressure, will continue the same for now.  He remains asymptomatic with atrial fibrillation.  I will repeat an echocardiogram to reevaluate his LV systolic function and also follow-up on the mitral regurgitation that was previously ordered.  I would like to see him back in 6 to 8 weeks for follow-up of his heart rate control and blood pressure.    Robert Decamp, MD, [facility] 09/19/2020, 11:07 AM Office: [phone] Pager: [phone]

## 2020-09-22 DIAGNOSIS — C048 Malignant neoplasm of overlapping sites of floor of mouth: Secondary | ICD-10-CM | POA: Diagnosis present

## 2020-09-22 DIAGNOSIS — I482 Chronic atrial fibrillation, unspecified: Secondary | ICD-10-CM | POA: Diagnosis not present

## 2020-09-22 DIAGNOSIS — I340 Nonrheumatic mitral (valve) insufficiency: Secondary | ICD-10-CM | POA: Diagnosis present

## 2020-09-22 DIAGNOSIS — Z79899 Other long term (current) drug therapy: Secondary | ICD-10-CM | POA: Diagnosis not present

## 2020-09-22 DIAGNOSIS — D649 Anemia, unspecified: Secondary | ICD-10-CM | POA: Diagnosis present

## 2020-09-22 DIAGNOSIS — Z87891 Personal history of nicotine dependence: Secondary | ICD-10-CM | POA: Diagnosis not present

## 2020-09-22 DIAGNOSIS — L988 Other specified disorders of the skin and subcutaneous tissue: Secondary | ICD-10-CM | POA: Diagnosis present

## 2020-09-22 DIAGNOSIS — Z7982 Long term (current) use of aspirin: Secondary | ICD-10-CM | POA: Diagnosis not present

## 2020-09-22 DIAGNOSIS — Z8673 Personal history of transient ischemic attack (TIA), and cerebral infarction without residual deficits: Secondary | ICD-10-CM | POA: Diagnosis not present

## 2020-09-22 DIAGNOSIS — Z4659 Encounter for fitting and adjustment of other gastrointestinal appliance and device: Secondary | ICD-10-CM | POA: Diagnosis not present

## 2020-09-22 DIAGNOSIS — I4891 Unspecified atrial fibrillation: Secondary | ICD-10-CM | POA: Diagnosis not present

## 2020-09-22 DIAGNOSIS — T8183XA Persistent postprocedural fistula, initial encounter: Secondary | ICD-10-CM | POA: Diagnosis present

## 2020-09-22 DIAGNOSIS — I10 Essential (primary) hypertension: Secondary | ICD-10-CM | POA: Diagnosis present

## 2020-09-22 DIAGNOSIS — C069 Malignant neoplasm of mouth, unspecified: Secondary | ICD-10-CM | POA: Diagnosis not present

## 2020-09-22 DIAGNOSIS — Z7901 Long term (current) use of anticoagulants: Secondary | ICD-10-CM | POA: Diagnosis not present

## 2020-09-22 DIAGNOSIS — I4819 Other persistent atrial fibrillation: Secondary | ICD-10-CM | POA: Diagnosis present

## 2020-09-22 DIAGNOSIS — D72829 Elevated white blood cell count, unspecified: Secondary | ICD-10-CM | POA: Diagnosis not present

## 2020-09-22 DIAGNOSIS — C760 Malignant neoplasm of head, face and neck: Secondary | ICD-10-CM | POA: Diagnosis not present

## 2020-09-25 ENCOUNTER — Telehealth: Payer: Self-pay | Admitting: Hematology and Oncology

## 2020-09-25 ENCOUNTER — Ambulatory Visit: Payer: Medicare Other | Admitting: Radiation Oncology

## 2020-09-25 ENCOUNTER — Ambulatory Visit: Payer: Medicare Other

## 2020-09-25 ENCOUNTER — Telehealth: Payer: Self-pay

## 2020-09-25 NOTE — Telephone Encounter (Signed)
Spoke with patient directly.  He has follow-up appointment with ENT surgeon on Thursday.  Advised patient to follow surgeon's recommendation regarding restarting Xarelto as it was held during recent admission but ENT due infection of surgical site.

## 2020-09-25 NOTE — Telephone Encounter (Signed)
Patient called to let you know he was in the hospital and was told next appointment is in 2 months. Patient was told in the hospital to Robert [name], but at discharge, he was told to start it again. He wants to know if needs to come to be seen earlier than than 2 months, being that he was told that he had an Afib episode. He is currently asymptomatic. Please advise.

## 2020-09-25 NOTE — Telephone Encounter (Signed)
Scheduled appt per 6/6 referral. Pt's wife is aware.

## 2020-09-27 DIAGNOSIS — M2518 Fistula, other specified site: Secondary | ICD-10-CM | POA: Diagnosis not present

## 2020-09-27 DIAGNOSIS — Z4889 Encounter for other specified surgical aftercare: Secondary | ICD-10-CM | POA: Diagnosis not present

## 2020-10-03 DIAGNOSIS — Z483 Aftercare following surgery for neoplasm: Secondary | ICD-10-CM | POA: Diagnosis not present

## 2020-10-03 DIAGNOSIS — S51801A Unspecified open wound of right forearm, initial encounter: Secondary | ICD-10-CM | POA: Diagnosis not present

## 2020-10-04 ENCOUNTER — Ambulatory Visit (INDEPENDENT_AMBULATORY_CARE_PROVIDER_SITE_OTHER): Payer: Medicare Other | Admitting: Dentistry

## 2020-10-04 ENCOUNTER — Other Ambulatory Visit: Payer: Self-pay

## 2020-10-04 ENCOUNTER — Encounter (HOSPITAL_COMMUNITY): Payer: Self-pay | Admitting: Dentistry

## 2020-10-04 DIAGNOSIS — K036 Deposits [accretions] on teeth: Secondary | ICD-10-CM

## 2020-10-04 DIAGNOSIS — C031 Malignant neoplasm of lower gum: Secondary | ICD-10-CM

## 2020-10-04 DIAGNOSIS — C801 Malignant (primary) neoplasm, unspecified: Secondary | ICD-10-CM

## 2020-10-04 DIAGNOSIS — Z7901 Long term (current) use of anticoagulants: Secondary | ICD-10-CM | POA: Diagnosis not present

## 2020-10-04 DIAGNOSIS — Z01818 Encounter for other preprocedural examination: Secondary | ICD-10-CM | POA: Diagnosis not present

## 2020-10-04 DIAGNOSIS — K06020 Generalized gingival recession, unspecified: Secondary | ICD-10-CM

## 2020-10-04 DIAGNOSIS — K030 Excessive attrition of teeth: Secondary | ICD-10-CM

## 2020-10-04 DIAGNOSIS — K08109 Complete loss of teeth, unspecified cause, unspecified class: Secondary | ICD-10-CM

## 2020-10-04 DIAGNOSIS — K0510 Chronic gingivitis, plaque induced: Secondary | ICD-10-CM

## 2020-10-04 NOTE — Progress Notes (Signed)
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 There is some pain with this esp when eating, some tingling of the lower lip but no frank numbness  Biopsies revealed:  09/03/2020 Final Pathologic Diagnosis   A. GINGIVA, LEFT CANINE, BIOPSY: --Reactive squamous mucosa with parakeratosis,  and hypergranulosis with underlying lichenoid infiltrate.              Negative for dysplasia and malignancy . B. GINGIVA, LEFT PREMOLAR, BIOPSY: --Superficially invasive squamous cell carcinoma.              See comment. C. LYMPH NODES, SUBMENTAL CONTENTS, DISSECTION: --Four  lymph nodes negative metastatic carcinoma (0/4).  D. LYMPH NODES, LEFT SUBMANDIBULAR CONTENTS, DISSECTION: --Four  lymph nodes negative metastatic carcinoma (0/4).   E. LYMPH NODES, LEFT NECK CONTENTS 2A, 3, DISSECTION: --Twelve lymph nodes negative for metastatic carcinoma (0/12). F. SUBMANDIBULAR GLAND, RIGHT, RESECTION: --Invasive,keratinizing, squamous cell carcinoma, well to moderately differentiated. --Carcinoma measures 3.2 cm in maximum gross dimension. --Tumor focally involves the inked resection margin.              See comment. G. LYMPH NODES, RIGHT NECK CONTENTS 2A, 3, DISSECTION: --Ten lymph nodes negative for metastatic carcinoma (0/10). H. LYMPH NODE, RIGHT NECK CONTENTS 2B, DISSECTION: --One lymph node negative for metastatic carcinoma (0/1). I. TEETH, LEFT PREMOLAR, EXTRACTION (GROSS EXAMINATION): --Tooth (gross only examination)  J. TEETH, RIGHT MANDIBULAR MOLAR, EXTRACTION (GROSS EXAMINATION): --Teeth (gross only examination).  K. ANTERIOR MANDIBLE AND LOWER LIP, MANDIBULECTOMY: --Invasive squamous cell carcinoma, well to moderately differentiated. --Carcinoma measures 4.1 cm in greatest gross dimension. --Carcinoma  histologically extends to at least a depth of 19 mm. --Margins uninvolved by dysplasia and invasive carcinoma. --Carcinoma invades into the dermis. --Perineural invasion identified; largest nerve diameter involved 0.1 mm. --Negative for lymphovascular invasion.                          See comment.  08/08/2020 --Final Diagnosis   A. MANDIBLE MASS, RIGHT, FINE NEEDLE ASPIRATION I (SMEARS, THINPREP, AND CELL BLOCK):              Highly atypical squamous cells, suspicious for squamous cell carcinoma. --Diagnosis Comment   Given the patient's history and the cytomorphology of the specimen these findings likely represent a recurrence.  01/21/2018 --FINAL PATHOLOGIC DIAGNOSIS  MICROSCOPIC EXAMINATION AND DIAGNOSIS  A.  LEFT BUCCAL LESION, EXCISION:       Superficially invasive squamous cell carcinoma, well to  moderately differentiated.       Tumor extends to the mucosal resection edge at 3 to 9  o'clock.       See comment.  B.  LEFT LINGUAL ASPECT OF THE LESION, BIOPSY:       Small focus of atypical squamous epithelium.       See comment.   Nutrition Status Yes No Comments  Weight changes? [x]  []  Reports he has lost ~18 lbs since his surgery in May of this year  Swallowing concerns? [x]  []  Unable to fully close his mouth, so unable to eat soft foods currently. Crushing pills and receiving all nutrition via NG tube. States if he has a straw he is able to drink thin liquids  PEG? []  [x]  Does have an NG tube   Referrals Yes No Comments  Social Work? [x]  []    Dentistry? [x]  []  10/04/2020 Dr. Sandi Mariscal  Swallowing therapy? [x]  []    Nutrition? [x]  []    Med/Onc? [x]  []     Safety Issues Yes No Comments  Prior radiation? []  [x]    Pacemaker/ICD? []  [x]    Possible current pregnancy? []  [x]  N/A  Is the patient on methotrexate? []  [x]     Tobacco/Marijuana/Snuff/ETOH use: Denies smoking any tobacco products. Had intermittent smokeless tobacco (chew) use; states he finally quit during  the pandemic. Reports very occasional alcohol consumption (usually just a beer 1-2x month)  Past/Anticipated interventions by otolaryngology, if any:  09/03/2020 Dr. Francina Ames --LIP CANCER RESECTION --[place] (Right Arm Lower) --SPLIT THICKNESS SKIN GRAFT ARM / WRIST --DENTAL EXTRACTIONS  --TRACHEOTOMY  01/21/2018 --Dr. Fenton Malling Wide excision of verrucous lesion with reconstruction using a posteriorly-based buccal mucosal flap  Past/Anticipated interventions by medical oncology, if any:  10/05/2020 Consult with Dr. Arletha Pili Iruku later this morning   Current Complaints / other details:  Patient has received the first [place] vaccines, as well as a Product/process development scientist

## 2020-10-04 NOTE — Progress Notes (Signed)
Department of Dental Medicine      OUTPATIENT CONSULT  Service Date:   10/04/2020  Patient Name:   Robert [name] Date of Birth:   Jul 18, 1940 Medical Record Number: [id number]  Referring Provider:               Lonie Peak, MD        TODAY'S VISIT:   Assessment:   There are no current signs of acute odontogenic infection including abscess, edema or erythema, or suspicious lesion requiring biopsy.    Recommendations:   No dental intervention indicated prior to starting radiation at this time.  Plan:   Discuss case with medical team and coordinate treatment as needed. Follow-up after completion of radiation therapy.  Discussed in detail all treatment options and recommendations with the patient and they are agreeable to the plan.    Thank you for consulting with Hospital Dent[facility] and for the opportunity to participate in this patient's treatment.  Should you have any questions or concerns, please contact the [facility] at [phone].        PROGRESS NOTE:   COVID-19 SCREENING:  The patient denies symptoms concerning for COVID-19 infection including fever, chills, cough, or newly developed shortness of breath.   HISTORY OF PRESENT ILLNESS: Robert [name] is a very pleasant 80 y.o. male with h/o atrial fibrillation, mitral regurgitation, long-term use of anticoagulation (on Xarelto) and HTN who was recently diagnosed with SCC of the oral cavity s/p lip resection with marginal mandibulectomy and bilateral neck dissection and is anticipating head and neck radiation.  The patient presents today for a medically necessary dental consultation as part of their pre- radiation therapy work-up.   DENTAL HISTORY: The patient reports that he does have a dentist he sees regularly every 6 months.  He recently underwent surgery at [facility] where he had several lower teeth extracted.  He is interested in having missing teeth replaced at some point in the  future.  He currently denies any dental/orofacial pain or sensitivity. Patient is able to manage oral secretions.  Patient denies dysphagia, odynophagia, dysphonia, SOB and neck pain.  Patient denies fever, rigors and malaise.   CHIEF COMPLAINT:  Here for a pre-head and neck radiation dental exam.   Patient Active Problem List   Diagnosis Date Noted  . Permanent atrial fibrillation (HCC) 10/22/2018  . Non-rheumatic mitral regurgitation 10/22/2018  . Obesity (BMI 30-39.9) 10/22/2018   Past Medical History:  Diagnosis Date  . A-fib (HCC)   . HTN (hypertension)    Past Surgical History:  Procedure Laterality Date  . DENTAL SURGERY     No Known Allergies Current Outpatient Medications  Medication Sig Dispense Refill  . gabapentin (NEURONTIN) 100 MG capsule Take 100 mg by mouth 3 (three) times daily.    . Metoprolol Tartrate 75 MG TABS Take 75 mg by mouth 2 (two) times daily.    . rivaroxaban (XARELTO) 20 MG TABS tablet Take 1 tablet (20 mg total) by mouth daily with supper. 90 tablet 3   No current facility-administered medications for this visit.    LABS: No results found for: WBC, HGB, HCT, MCV, PLT No results found for: NA, K, CL, CO2, GLUCOSE, BUN, CREATININE, CALCIUM, GFRNONAA, GFRAA No results found for: INR, PROTIME No results found for: PTT  Social History   Socioeconomic History  . Marital status: Married    Spouse name: Not on file  . Number of children: 2  . Years of education: Not on  file  . Highest education level: Not on file  Occupational History  . Not on file  Tobacco Use  . Smoking status: Not on file  . Smokeless tobacco: Former    Types: Engineer, drilling  . Vaping Use: Former  Substance and Sexual Activity  . Alcohol use: Yes    Comment: occ  . Drug use: Never  . Sexual activity: Not on file  Other Topics Concern  . Not on file  Social History Narrative  . Not on file   Social Determinants of Health   Financial Resource Strain: Not on  file  Food Insecurity: Not on file  Transportation Needs: Not on file  Physical Activity: Not on file  Stress: Not on file  Social Connections: Not on file  Intimate Partner Violence: Not on file   Family History  Problem Relation Age of Onset  . COPD Mother   . Hypertension Father     REVIEW OF SYSTEMS:  Reviewed with the patient as per HPI. Psych: Patient denies having dental phobia.   VITAL SIGNS: BP 109/68 (BP Location: Right Arm, Patient Position: Sitting, Cuff Size: Normal)   Pulse 62   Temp 98.3 F (36.8 C) (Oral)    PHYSICAL EXAM: General:  Well-developed, comfortable and in no apparent distress. Neurological:  Alert and oriented to person, place and  time. Extraoral:  Facial symmetry present without any edema or erythema. Recent surgery with healthy appearing skin around surgical site. Has feeding tube through nose. No swelling or lymphadenopathy.  TMJ asymptomatic without clicks or crepitations. (+) Trismus Intraoral:  Soft tissues appear well-perfused and mucous membranes moist.  FOM and vestibules soft and not raised. Oral cavity without mass or lesion. No signs of infection, parulis, sinus tract, edema or erythema evident upon exam.  (+) Recent surgery with intraoral flap; appears to be healing well with healthy tissue surrounding margins underneath the tongue.   DENTAL EXAM: Hard tissue exam completed and charted. Overall impression:  Good remaining dentition.  Missing teeth, caries, existing restorations, crowns. Oral hygiene:  Fair   Periodontal:  Pink, healthy gingival tissue with blunted papilla.  Localized calculus accumulation. #3, #15, #18 and #19 gingival recession. Caries:  No clinical caries evident. Removable/fixed prosthodontics:  #3 and #4 PFM crowns, #8 endosteal implant with definitive crown, #15 and #18 PFM crown, #19 gold onlay Occlusion:  Unable to assess molar occlusion.  Non-functional teeth numbers #3-#12. Other findings:  Attrition/wear:  #7I and #10I   RADIOGRAPHIC EXAM:  PAN and 3 periapical images exposed and interpreted.  Condyles seated bilaterally in fossas.  No evidence of abnormal pathology.  All visualized osseous structures appear WNL.  Radiopacities noted on the right side close to the angle of the mandible consistent with recent surgery.  Localized mild horizontal bone loss.  Missing tooth #8 which has been replaced with endosteal implant and crown.  Existing restorations, #9 full-coverage crown.   ASSESSMENT:  1.  SCC of the oral cavity 2.  Pre-head and neck radiation dental exam 3.  Missing teeth 4.  Accretions on teeth 5.  Gingival recession, generalized 6.  Chronic gingivitis 7.  Attrition/wear 8.  Postoperative bleeding risk 9. Long-term use of anticoagulation (on Xarelto)   PROCEDURES: The common and significant side effects of radiation therapy to the head and neck were explained and discussed with the patient.  The discussion included side effects of trismus (limited opening), dysgeusia (loss of taste), xerostomia (dry mouth), radiation caries and osteoradionecrosis of the jaw.  I also discussed the importance of maintaining optimal oral hygiene and oral health before, during and after radiation to decrease the risk of developing radiation cavities and the need for any surgery such as extractions after therapy.     PLAN AND RECOMMENDATIONS: I discussed the risks, benefits, and complications of various scenarios with the patient in relationship to their medical and dental conditions, which included systemic infection or other serious issues such as osteoradionecrosis that could potentially occur either before, during or after their anticipated radiation therapy if dental/oral concerns are not addressed.  I explained that if any chronic or acute dental/oral infection(s) are addressed and subsequently not maintained following medical optimization and recovery, their risk of the previously mentioned  complications are just as high and could potentially occur postoperatively.  I explained all significant findings of the dental consultation with the patient including calculus or tartar accumulation and some gingival recession/bone loss and the recommended care including the importance of following up with his regular dentist every 4-6 months after radiation in order to optimize them from a dental standpoint.  The patient verbalized understanding of all findings, discussion, and recommendations. We then discussed various treatment options to include no treatment, multiple extractions with alveoloplasty, pre-prosthetic surgery as indicated, periodontal therapy, dental restorations, root canal therapy, crown and bridge therapy, implant therapy, and replacement of missing teeth as indicated.  The patient verbalized understanding of all options, and currently wishes to proceed with continuing to see his regular dentist after radiation therapy. Plan to discuss all findings and recommendations with medical team and coordinate future care as needed.  The patient will need to return to his regular dentist for routine dental care including replacement of missing teeth as needed, cleanings and exams.  All questions and concerns were invited and addressed.  The patient tolerated today's visit well and departed in stable condition.   I spent in excess of 120 minutes during the conduct of this consultation and >50% of this time involved direct face-to-face encounter for counseling and/or coordination of the patient's care.  Nashley Cordoba B. Chales Salmon, D.M.D.

## 2020-10-04 NOTE — Progress Notes (Signed)
Radiation Oncology         [phone] ________________________________  Initial Outpatient Consultation  Name: Robert [name] MRN: [id number]  Date: 10/05/2020  DOB: 1941-01-24  CC:Farris Has, MD  Corey Skains, MD   REFERRING PHYSICIAN: Corey Skains, MD  DIAGNOSIS: C03.1    ICD-10-CM   1. 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 He received left buccal lesion biopsy and reconstruction in October of 2019 under Dr. Erroll Luna which revealed this diagnosis.   On 08/08/20, the patient followed up with Dr. Erroll Luna in which the patient mentioned that he noticed a mass to his right anterior mandible lip area in the beginning of January 2022. He noted that this mass has grown dramatically in recent times prior to this visit. Also noted by the patient was an associated mass to his right jaw line. The patient stated that both masses ache while he is eating, and he feels a tingling in his lower lip from time to time. During this visit, Dr. Erroll Luna obtained a sample via syringe of the right neck mass and submitted the sample for cytology. Cytology from the sample revealed highly atypical squamous cells, suspicious for squamous cell carcinoma.   Neck CT taken on 08/15/20 revealed a large mass arising from the buccal mucosal surface of the right lower lip, compatible with recurrent squamous  cell carcinoma. Chest CT taken on the same day showed no evidence of metastatic diease within the chest.  I have personally reviewed his images  Subsequently, the patient saw Dr. Hezzie Bump who referred the patient for a lip cancer resection with multiple biopsies.  Multiple biopsies of varying areas on 09/03/20 revealed:  -Gingiva, left premolar biopsy: superficially invasive squamous cell carcinoma  Gingiva, left canine biopsy: reactive squamous mucosa with parakeratosis and hypergranulosis with underlying lichenoid infiltrate. -Right submandibular gland resection: invasive and keratinizing squamous cell carcinoma noted to be well to moderately differentiated. Carcinoma measured 3.2 cm in the greatest extent. The tumor is noted to focally involve the resection margin -Mandibulectomy of anterior mandible and lower lip: invasive squamous cell carcinoma measuring 4.1 cm in the greatest dimension, and extending to a depth of at least 19 mm. The carcinoma was noted to invade into the dermis. Perineural invasion was identified, with the largest nerve diameter involved being 0.1 mm.  -All lymph node dissections from all areas were negative for metastatic carcinoma. 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 The patient was admitted with feeding tube placement and received wound treatment/packing. He was discharged home with tube feed and antibiotics.   Swallowing issues,   received recent placement of NG feeding tube on 09/22/20 and has trouble swallowing because  he cannot fully close his mouth  Weight Changes: Lost about 18 pounds since surgery  Pain status: none  Other symptoms: none  Tobacco history, if any: former user of smokeless tobacco; used  snuff and quit on 05/10/20. Also has a history of using chewing tobacco which he quit in May of 2018.  ETOH abuse, if any: Reports very occasional alcohol consumption ( a beer 1-2x month)     PREVIOUS RADIATION THERAPY: No  PAST MEDICAL HISTORY:  has a past medical history of A-fib (HCC) and HTN (hypertension).    PAST SURGICAL HISTORY: Past Surgical History:  Procedure Laterality Date   DENTAL SURGERY      FAMILY HISTORY: family history includes COPD in his mother; Hypertension in his father.  SOCIAL HISTORY:  reports that he does not have a smoking history on file. He has quit using smokeless tobacco.  His smokeless tobacco use included chew. He reports current alcohol use. He reports that he does not use drugs.  ALLERGIES: Patient has no known allergies.  MEDICATIONS:  Current Outpatient Medications  Medication Sig Dispense Refill   Amino Acids-Protein Hydrolys (PRO-STAT) LIQD Give 1 Package by tube in the morning and at bedtime.     amoxicillin-clavulanate (AUGMENTIN) 875-125 MG tablet Take 1 tablet by mouth 2 (two) times daily.     gabapentin (NEURONTIN) 100 MG capsule Take 100 mg by mouth 3 (three) times daily.     metoprolol tartrate (LOPRESSOR) 100 MG tablet Take 100 mg by mouth 2 (two) times daily.     rivaroxaban (XARELTO) 20 MG TABS tablet Take 1 tablet (20 mg total) by mouth daily with supper. 90 tablet 3   No current facility-administered medications for this encounter.    REVIEW OF SYSTEMS:  Notable for that above.   PHYSICAL EXAM:  height is 6' (1.829 m) and weight is 220 lb (99.8 kg). His temperature is 97.7 F (36.5 C). His blood pressure is 105/64 and his pulse is 86. His respiration is 20 and oxygen saturation is 100%.   General: Alert and oriented, in no acute distress HEENT: Oral cavity demonstrates satisfactory healing from recent surgery and reconstruction without any evidence of local recurrence.  Oropharynx is notable for no lesions.  No thrush.  He  drools that time with copious saliva.. Neck: Neck is notable for no palpable adenopathy.  There is a fistula less than 1 cm in dimension in the right lower neck with packing, draining light yellow discharge Heart: Regular in rate and rhythm with no murmurs, rubs, or gallops. Chest: Clear to auscultation bilaterally, with no rhonchi, wheezes, or rales. Abdomen: Soft, nontender, nondistended, with no rigidity or guarding. Extremities: Mild lower extremity edema. Lymphatics: see Neck Exam Neurologic:  No obvious focalities. Speech is fluent.   Psychiatric: Judgment and insight are intact. Affect is appropriate.   ECOG = 1  0 - Asymptomatic (Fully active, able to carry on all predisease activities without restriction)  1 - Symptomatic but completely ambulatory (Restricted in physically strenuous activity but ambulatory and able to carry out work of a light or sedentary nature. For example, light housework, office work)  2 - Symptomatic, <50% in bed during the day (Ambulatory and capable of all self care but unable to carry out any work activities. Up and about more than 50% of waking hours)  3 - Symptomatic, >50% in bed, but not bedbound (Capable of only limited self-care, confined to bed or chair 50% or more of waking hours)  4 - Bedbound (  Completely disabled. Cannot carry on any self-care. Totally confined to bed or chair)  5 - Death   Santiago Glad MM, Creech RH, Tormey DC, et al. [phone]). "Toxicity and response criteria of the [facility] Group". Am. Evlyn Clines. Oncol. 5 (6): 649-55   LABORATORY DATA:   Ref Range & Units 11 d ago Comments  Sodium 135 - 146 MMOL/L 139    Potassium 3.5 - 5.3 MMOL/L 4.0  NO VISIBLE HEMOLYSIS  Chloride 98 - 110 MMOL/L 106    CO2 21 - 31 MMOL/L 27    BUN 8 - 24 MG/DL 15    Glucose 70 - 99 MG/DL 536 High     Creatinine 0.70 - 1.30 MG/DL 6.44    Calcium 8.5 - 03.4 MG/DL 7.8 Low     Anion Gap 4 - 14 MMOL/L 6    Est. GFR >=60 ML/MIN/1.73 M*2   ML/MIN/1.73 M*2 >90       Ref Range & Units 11 d ago  WBC 4.4 - 11.0 x 10*3/uL 6.7   RBC 4.50 - 5.90 x 10*6/uL 3.23 Low    Hemoglobin 14.0 - 17.5 G/DL 9.4 Low    Hematocrit 74.2 - 50.4 % 28.1 Low    MCV 80.0 - 96.0 FL 87.0   MCH 27.5 - 33.2 PG 29.2   MCHC 33.0 - 37.0 G/DL 59.5   RDW % 63.8   Platelets 150 - 450 X 10*3/uL 281   MPV 6.8 - 10.2 FL 7.6   Resulting Agency  [facility]  Specimen Collected: 09/24/20 03:14 Last Resulted: 09/24/20 04:22  Received From: [facility]  Result Received: 09/26/20 07:54      Free T4 was 1.4 on June 4 at [facility].  Third generation TSH on that same date was 0.97  RADIOGRAPHY: As above    IMPRESSION/PLAN:  This is a delightful patient with head and neck cancer. I recommend 33 fractions of adjuvant radiotherapy for this patient.  I recommend treating the tumor bed and bilateral neck.  We will conduct CT simulation on June 20.  Anticipate starting treatment on June 27.   I sent a message to Dr. Hezzie Bump  make sure that he is satisfactorily healed to start treatment by then.  Also sent a message to Dr. Al Pimple who will be seeing him later today to discuss adjuvant chemotherapy. (After consultation she told me that patient will need to heal further at his skin graft harvest site over his forearm to be eligible for chemotherapy.  We will proceed with radiation as soon as reasonably possible, however)  We discussed the potential risks, benefits, and side effects of radiotherapy. We talked in detail about acute and late effects. We discussed that some of the most bothersome acute effects may be mucositis, dysgeusia, salivary changes, skin irritation, hair loss, dehydration, weight loss and fatigue. We talked about late effects which include but are not necessarily limited to dysphagia, hypothyroidism, nerve injury, mandibular injury, dental injury, vascular injury, spinal cord injury, xerostomia, trismus, neck  edema, and potential injury to any of the tissues in the head and neck region. No guarantees of treatment were given. A consent form was signed and placed in the patient's medical record. The patient is enthusiastic about proceeding with treatment. I look forward to participating in the patient's care.      We also discussed that the treatment of head and neck cancer is a multidisciplinary process to maximize treatment outcomes and quality  of life. For this reason the following referrals have been or will be made:   Medical oncology to discuss chemotherapy    Dentistry for dental evaluation, possible extractions in the radiation fields, and /or advice on reducing risk of cavities, osteoradionecrosis, or other oral issues.   Nutritionist for nutrition support during and after treatment.   Speech language pathology for swallowing and/or speech therapy.   Social work for social support.    Physical therapy due to risk of lymphedema in neck and deconditioning.   Baseline labs including TSH.  On date of service, in total, I spent 60 minutes on this encounter. Patient was seen in person.  __________________________________________   Lonie Peak, MD   This document serves as a record of services personally performed by Lonie Peak, MD. It was created on her behalf by Neena Rhymes, a trained medical scribe. The creation of this record is based on the scribe's personal observations and the provider's statements to them. This document has been checked and approved by the attending provider.

## 2020-10-04 NOTE — Patient Instructions (Signed)
[place], D.M.D. Phone: [phone] Fax: [phone]   It was a pleasure seeing you today!  Please refer to the information below regarding your dental visit with [place].  Call if you have any questions or concerns that come up after you leave.   Thank you for letting us provide care for you.  If there is anything we can do for you, please let us know.    RADIATION THERAPY AND INFORMATION REGARDING YOUR TEETH   XEROSTOMIA (DRY MOUTH):  Your salivary glands may be in the field of radiation.  Radiation may include all or only part of your salivary glands.  This will cause your saliva to dry up, and you will have a dry mouth.  The dry mouth will be for the rest of your life unless your radiation oncologist tells you otherwise.  Your saliva has many functions: It wets your tongue for speaking. It coats your teeth and the inside of your mouth for easier movement. It helps with chewing and swallowing food. It helps clean away harmful acid and toxic products made by the germs in your mouth, therefore it helps prevent cavities. It kills some germs in your mouth and helps to prevent gum disease. It helps to carry flavor to your taste buds.  Once you have lost your saliva, you will be at higher risk for tooth decay and gum disease.    What can be done to help improve your mouth when there's not enough saliva? Your dentist may give a recommendation for CLoSYS.  It will not bring back all of your saliva but may bring back some of it.  Also, your saliva may be thick and ropy or white and foamy.  It will not feel like it use to feel. You will need to swish with water every time your mouth feels dry.  YOU CANNOT suck on any cough drops, mints, lemon drops, candy, vitamin C or any other products.  You cannot use anything other than water to make your mouth feel less dry.  If you want to drink anything else, you have to drink it all at once and brush  afterwards.  Be sure to discuss the details of your diet habits with your dentist or hygienist.   RADIATION CARIES:  This is decay (cavities) that happens very quickly once your mouth is very dry due to radiation therapy.  Normally, cavities take six months to two years to become a problem.  When you have dry mouth, cavities may take as little as eight weeks to cause you a problem.    Dental check-ups every two months are necessary as long as you have a dry mouth. Radiation caries typically, but not always, start at your gum line where it is hard to see the cavity.  It is therefore also hard to fill these cavities adequately.  This high rate of cavities happens because your mouth no longer has saliva and therefore the acid made by the germs starts the decay process.  Whenever you eat anything the germs in your mouth change the food into acid.  The acid then burns a small hole in your tooth.  This small hole is the beginning of a cavity.  If this is not treated then it will grow bigger and become a cavity.  The way to avoid this hole getting bigger is to use fluoride every evening as prescribed by your dentist following your radiation. NOTE:  You have to make sure  that your teeth are very clean before you use the fluoride.  This fluoride in turn will strengthen your teeth and prepare them for another day of fighting acid. If you develop radiation caries many times, the damage is so large that you will have to have all your teeth removed.  This could be a big problem if some of these teeth are in the field of radiation.  Further details of why this could be a big problem will follow (see Osteoradionecrosis below).   DYSGEUSIA (LOSS OF TASTE):  This happens to varying degrees once you've had radiation therapy to your jaw region.  Many times taste is not completely lost, but becomes limited.  The loss of taste is mostly due to radiation affecting your taste buds.  However, if you have no saliva in your mouth  to carry the flavor to your taste buds, it would be difficult for your taste buds to taste anything.  That is why using water or a prescription for Salagen prior to meals and during meal times may help with some of the taste.  Keep in mind that taste generally returns very slowly over the course of several months or several years after radiation therapy.  Don't give up hope.   TRISMUS (LIMITED JAW OPENING):  According to your radiation oncologist, your TMJ or jaw joints are going to be partially or fully in the field of radiation.  This means that over time the muscles that help you open and close your mouth may get stiff.  This will potentially result in your not being able to open your mouth wide enough or as wide as you can open it now.    Let me give you an example of how slowly this happens and how unaware people are of it:   A gentlemen that had radiation therapy two years ago came back to me complaining that bananas are just too large for him to be able to fit them in between his teeth.  He was not able to open wide enough to bite into a banana.  This happens slowly and over a period of time.  What we do to try and prevent this:   Your dentist will probably give you a stack of sticks called a trismus exercise device.  This stack will help remind your muscles and your jaw joints to open up to the same distance every day.  Use these sticks every morning when you wake up, or according to the instructions given by your dentist.    You must use these sticks for at least one to two years after radiation therapy.  The reason for that is because it happens so slowly and keeps going on for about two years after radiation therapy.  Your hospital dentist will help you monitor your mouth opening and make sure that it's not getting smaller after radiation.  TRISMUS EXERCISES: Using the stack of sticks given to you by your dentist, place the stack in your mouth and hold onto the other end for support. Leave  the sticks in your mouth while holding the other end.  Allow 30 seconds for muscle stretching. Rest for a few seconds. Repeat 3-5 times. This exercise is recommended in the mornings and evenings unless otherwise instructed. The exercise should be done for a period of 2 YEARS after the end of radiation. Your maximum jaw opening should be checked regularly at recall dental visits by your general dentist. You should report any changes, soreness, or difficulties encountered  when doing the exercises to your dentist.   OSTEORADIONECROSIS (ORN):  This is a condition where your jaw bone after radiation therapy becomes very dry.  It has very little blood supply to keep it alive.  If you develop a cavity that turns into an abscess or an infection, then the jaw bone does not have enough blood supply to help fight the infection.  At this point it is very likely that the infection could cause the death of your jaw bone.  When you have dead bone it has to be removed.  Therefore, you might end up having to have surgery to remove part of your jaw bone, the part of the jaw bone that has been affected.     Healing is also a problem if you are to have surgery (like a tooth extraction) in the areas where the bone has had radiation therapy.  If you have surgery, you need more blood supply to heal which is not available.  When blood supply and oxygen are not available, there is a chance for the bone to die. Occasionally, ORN happens on its own with no obvious reason, but this is quite rare.  We believe that patients who continue to smoke and/or drink alcohol have a higher chance of having this problem. Once your jaw bone has had radiation therapy, if there are any remaining teeth in that area, it is not recommended to have them pulled unless your dentist or oral surgeon is aware of your history of radiation and believes it is safe.  The risks for ORN either from infection or spontaneously occurring (with no reason) are life  long.   QUESTIONS? Call our office during office hours at [phone].

## 2020-10-05 ENCOUNTER — Ambulatory Visit
Admission: RE | Admit: 2020-10-05 | Discharge: 2020-10-05 | Disposition: A | Discharge status: Home / Self Care | Payer: Medicare Other | Source: Ambulatory Visit | Attending: Radiation Oncology | Admitting: Radiation Oncology

## 2020-10-05 ENCOUNTER — Other Ambulatory Visit: Payer: Self-pay

## 2020-10-05 ENCOUNTER — Inpatient Hospital Stay (HOSPITAL_BASED_OUTPATIENT_CLINIC_OR_DEPARTMENT_OTHER): Payer: Medicare Other | Admitting: Hematology and Oncology

## 2020-10-05 ENCOUNTER — Inpatient Hospital Stay: Payer: Medicare Other

## 2020-10-05 ENCOUNTER — Encounter: Payer: Self-pay | Admitting: Radiation Oncology

## 2020-10-05 ENCOUNTER — Encounter: Payer: Self-pay | Admitting: Hematology and Oncology

## 2020-10-05 VITALS — BP 105/64 | HR 86 | Temp 97.7°F | Resp 20 | Ht 72.0 in | Wt 220.0 lb

## 2020-10-05 DIAGNOSIS — S51801A Unspecified open wound of right forearm, initial encounter: Secondary | ICD-10-CM

## 2020-10-05 DIAGNOSIS — I4891 Unspecified atrial fibrillation: Secondary | ICD-10-CM | POA: Insufficient documentation

## 2020-10-05 DIAGNOSIS — Z7901 Long term (current) use of anticoagulants: Secondary | ICD-10-CM | POA: Insufficient documentation

## 2020-10-05 DIAGNOSIS — I10 Essential (primary) hypertension: Secondary | ICD-10-CM | POA: Insufficient documentation

## 2020-10-05 DIAGNOSIS — C031 Malignant neoplasm of lower gum: Secondary | ICD-10-CM | POA: Insufficient documentation

## 2020-10-05 DIAGNOSIS — C411 Malignant neoplasm of mandible: Secondary | ICD-10-CM

## 2020-10-05 DIAGNOSIS — Z87891 Personal history of nicotine dependence: Secondary | ICD-10-CM | POA: Diagnosis not present

## 2020-10-05 DIAGNOSIS — Z510 Encounter for antineoplastic radiation therapy: Secondary | ICD-10-CM | POA: Diagnosis not present

## 2020-10-05 DIAGNOSIS — Z85818 Personal history of malignant neoplasm of other sites of lip, oral cavity, and pharynx: Secondary | ICD-10-CM | POA: Insufficient documentation

## 2020-10-05 DIAGNOSIS — Z79899 Other long term (current) drug therapy: Secondary | ICD-10-CM | POA: Diagnosis not present

## 2020-10-05 NOTE — Progress Notes (Signed)
[place] NOTE  Patient Care Team: London Pepper, MD as PCP - General (Family Medicine)  CHIEF COMPLAINTS/PURPOSE OF CONSULTATION:  SCC gingivobuccal sulcus.  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  He is status post surgery by Dr.Walton on 09/03/2020 and there is concern that there is a submandibular mass which may represent tumor replacing entire lymph node with extranodal extension or tumor deposit. Given this information, we discussed about considering adjuvant chemoradiation. He appears healthy for his age and would be a good candidate for additional chemotherapy along with radiation however there is a large open wound on his right forearm with exposed tendons and healthy pink granulation tissue (please refer to the picture in the media).  The neck wound appears to be healing well, this is not much of a concern.  Given the lack of skin barrier and very large open surface, this will be an increased risk for infection while he is on chemotherapy which in this 80 yr old can lead to sepsis and fatal infections.  If he does get a skin graft and appears to be healing well, we can certainly consider chemotherapy.  I also discussed about cisplatin weekly concurrent with radiation, adverse effects of cisplatin including but not limited to fatigue, nausea, vomiting, diarrhea, increased risk of infections, ototoxicity and nephrotoxicity.  Peripheral neuropathy is generally rare when cisplatin is given at doses less than 200 mg per metered square.  He expressed understanding of all recommendations, will return to clinic in about 2 weeks for follow-up.  In the meantime if radiation is to be started, I would recommend going ahead with the  plan for radiation and will add chemotherapy if feasible.  Discussed this with Dr. Isidore Moos and Dr. Nicolette Bang. Thank you for consulting [place] the care of this patient.  Please not hesitate to contact us with any additional questions or concerns.  HISTORY OF PRESENTING ILLNESS:   Robert [name] 80 y.o. male is here because of recurrent SCC of the gingivobuccal sulcus.  Chronology  He had surgery by Dr Vicie Mutters in Oct 2019 for a left buccal SCCa, reconstructed with local mucosal flaps. He was lost to f/u.   In early 2022 he developed a new squamous cell carcinoma in the right gingivobuccal sulcus and a submandibular neck mass.  He presented to Dr. Silvio Clayman and underwent composite resection with bilateral neck dissection on 09/03/2020.  Pathology from this mass showed superficially invasive squamous cell carcinoma at the left premolar gingiva.  There is an invasive keratinizing squamous cell carcinoma well to moderately differentiated measuring 3.2 cm in maximum gross dimension, tumor focally involves the inked resection margin.  There is a comment that the tumor may actually represent an entirely replaced lymph node with extranodal extension or tumor deposit.  No lymphoid parenchyma was identified in the section.  The tumor extends to focally involve the inked resection margin, does not invade the edges and submandibular gland.  Final pathological classification was considered SBP T4a, PN 0 versus PN2a if submandibular gland is considered a lymph node.  There is also perineural invasion in the primary gingivobuccal sulcus area.  All specimen margins are negative for invasive tumor 31 lymph nodes negative for tumor.  He was discussed in our ENT tumor board for consideration of concurrent chemotherapy  radiation after surgery given the        concern of the submandibular area was indeed a lymph node with extracapsular extension. He is here for an initial visit by himself.  He denies any complains except for the  NG tube which has been annoying, the fact that he cant close his mouth and drools saliva all the time. No other concerns today, he says neck fistula is better.  Graft site on forearm according to him has an open wound and he is dressing every day.  Rest of the pertinent 10 point ROS reviewed and neg.  MEDICAL HISTORY:  Past Medical History:  Diagnosis Date   A-fib ([place])    HTN (hypertension)     SURGICAL HISTORY: Past Surgical History:  Procedure Laterality Date   DENTAL SURGERY      SOCIAL HISTORY: Social History   Socioeconomic History   Marital status: Married    Spouse name: Not on file   Number of children: 2   Years of education: Not on file   Highest education level: Not on file  Occupational History   Not on file  Tobacco Use   Smoking status: Not on file   Smokeless tobacco: Former    Types: Nurse, children's Use: Former  Substance and Sexual Activity   Alcohol use: Yes    Comment: occ   Drug use: Never   Sexual activity: Not on file  Other Topics Concern   Not on file  Social History Narrative   Not on file   Social Determinants of Health   Financial Resource Strain: Not on file  Food Insecurity: Not on file  Transportation Needs: Not on file  Physical Activity: Not on file  Stress: Not on file  Social Connections: Not on file  Intimate Partner Violence: Not on file    FAMILY HISTORY: Family History  Problem Relation Age of Onset   COPD Mother    Hypertension Father     ALLERGIES:  has No Known Allergies.  MEDICATIONS:  Current Outpatient Medications  Medication Sig Dispense Refill   Amino Acids-Protein Hydrolys (PRO-STAT) LIQD Give 1 Package by tube in the morning and at bedtime.     amoxicillin-clavulanate (AUGMENTIN) 875-125 MG tablet Take 1 tablet by mouth 2 (two) times daily.     gabapentin (NEURONTIN) 100 MG capsule Take 100 mg by mouth 3 (three) times daily.     metoprolol tartrate (LOPRESSOR) 100 MG tablet Take 100 mg  by mouth 2 (two) times daily.     rivaroxaban (XARELTO) 20 MG TABS tablet Take 1 tablet (20 mg total) by mouth daily with supper. 90 tablet 3   No current facility-administered medications for this visit.     PHYSICAL EXAMINATION: ECOG PERFORMANCE STATUS: 1 - Symptomatic but completely ambulatory  Vitals:   10/05/20 1004  BP: 105/64  Pulse: 86  Resp: 20  Temp: 97.7 F (36.5 C)  SpO2: 100%   Filed Weights   10/05/20 1004  Weight: 220 lb (99.8 kg)    GENERAL:alert, no distress and comfortable SKIN: Large area of open wound with pink granulation tissue and tendon noted on right fore arm. Thigh site looks well. Fistula in neck appears to be healing well. EYES: normal, conjunctiva are pink and non-injected, sclera clear Graft site on the face and oral cavity looks well. No concerns. Neck: Skin tightening from surgery. No concerns. LUNGS: clear to auscultation and percussion with normal breathing effort Heart: irregularly irregular HR. ABDOMEN:  abdomen soft, non-tender and normal bowel sounds Musculoskeletal:no cyanosis of digits and no clubbing  PSYCH: alert & oriented x 3 with fluent speech NEURO: no focal motor/sensory deficits  LABORATORY DATA:  I have reviewed the data as listed No results found for: WBC, HGB, HCT, MCV, PLT   Chemistry   No results found for: NA, K, CL, CO2, BUN, CREATININE, GLU No results found for: CALCIUM, ALKPHOS, AST, ALT, BILITOT    RADIOGRAPHIC STUDIES: I have personally reviewed the radiological images as listed and agreed with the findings in the report. No results found.  All questions were answered. The patient knows to call the clinic with any problems, questions or concerns. I spent over 60 minutes in the care of this patient including H and P, review of records, counseling and coordination of care. outside records, pathology were reviewed, discussed indications for chemo after surgery, discussed adverse effects of chemo and  recommendations.    Benay Pike, MD 10/05/2020 10:17 AM

## 2020-10-05 NOTE — Progress Notes (Signed)
Oncology Nurse Navigator Documentation   Met with patient during initial consult with Dr. Isidore Moos and Dr. Chryl Heck.   Further introduced myself as his Navigator, explained my role as a member of the Care Team. Provided New Patient Information packet: Contact information for physician, this navigator, other members of the Care Team Advance Directive information ([place] blue pamphlet with LCSW insert); provided [facility] AD booklet at his request,  Fall Prevention Patient [place] Information sheet Symptom Management Clinic information [facility] campus map with highlight of [place] SLP Information sheet Provided and discussed educational handouts for PEG and PAC. Assisted with post-consult appt scheduling. He verbalized understanding of information provided. I encouraged them to call with questions/concerns moving forward. He is scheduled for CT simulation on 10/08/20.  Dr. Chryl Heck will see Mr. Frieden again on 7/5 for follow up to discuss possible chemotherapy.   Harlow Asa, RN, BSN, OCN Head & Neck Oncology Nurse [place] at [place] [phone]

## 2020-10-08 ENCOUNTER — Telehealth: Payer: Self-pay | Admitting: Nutrition

## 2020-10-08 ENCOUNTER — Encounter: Payer: Self-pay | Admitting: *Deleted

## 2020-10-08 ENCOUNTER — Other Ambulatory Visit: Payer: Self-pay

## 2020-10-08 ENCOUNTER — Ambulatory Visit: Payer: Medicare Other | Admitting: Radiation Oncology

## 2020-10-08 DIAGNOSIS — Z510 Encounter for antineoplastic radiation therapy: Secondary | ICD-10-CM | POA: Diagnosis not present

## 2020-10-08 DIAGNOSIS — C031 Malignant neoplasm of lower gum: Secondary | ICD-10-CM | POA: Diagnosis not present

## 2020-10-08 NOTE — Telephone Encounter (Signed)
Scheduled appt per 6/17 sch msg. Pt aware.

## 2020-10-08 NOTE — Progress Notes (Signed)
[place] Work  Initial Assessment   Robert [name] is a 80 y.o. year old male contacted by phone. Clinical Social Work was referred by radiation oncology for assessment of psychosocial needs.   SDOH (Social Determinants of Health) assessments performed: Yes   Distress Screen completed: Yes ONCBCN DISTRESS SCREENING 10/05/2020  Screening Type Initial Screening  Distress experienced in past week (1-10) 0  Emotional problem type Adjusting to illness  Information Concerns Type Lack of info about treatment  Physician notified of physical symptoms -  Referral to clinical psychology -  Referral to clinical social work -  Referral to dietition -  Referral to financial advocate -  Referral to support programs -  Referral to palliative care -      Family/Social Information:  Housing Arrangement: patient lives with wife Patient stated he moved from [place] about 10 years ago to be closer tohis adult children.  His daughter and granddaughter live in the [place] area and his son lives [place].    Family members/support persons in your life? Family Transportation concerns: no  Employment: Retired. Income source: Paediatric nurse concerns: No Type of concern: None Food access concerns: no Religious or spiritual practice: no Medication Concerns: no  Coping/ Adjustment to diagnosis: Patient understands treatment plan and what happens next? yes Concerns about diagnosis and/or treatment: I'm not especially worried about anything Patient reported stressors: Adjusting to my illness Hopes and priorities: start and complete treatment  Patient enjoys time with family/ friends Current coping skills/ strengths: Communication skills and Supportive family/friends    SUMMARY: Current SDOH Barriers:  None identified at this time.  Clinical Social Work Clinical Goal(s):  Patient will contact CSW with questions or concerns.  Interventions: Discussed common  feeling and emotions when being diagnosed with cancer, and the importance of support during treatment Informed patient of the support team roles and support services at [facility] Provided CSW contact information and encouraged patient to call with any questions or concerns     Follow Up Plan: Patient will contact CSW with any support or resource needs Patient verbalizes understanding of plan: Yes    [place] , LCSW   Johnnye Lana, MSW, LCSW, OSW-C Clinical Social Worker [place] [phone]

## 2020-10-09 DIAGNOSIS — R1312 Dysphagia, oropharyngeal phase: Secondary | ICD-10-CM | POA: Diagnosis not present

## 2020-10-09 DIAGNOSIS — C969 Malignant neoplasm of lymphoid, hematopoietic and related tissue, unspecified: Secondary | ICD-10-CM | POA: Diagnosis not present

## 2020-10-09 DIAGNOSIS — Z483 Aftercare following surgery for neoplasm: Secondary | ICD-10-CM | POA: Diagnosis not present

## 2020-10-09 DIAGNOSIS — Z9889 Other specified postprocedural states: Secondary | ICD-10-CM | POA: Diagnosis not present

## 2020-10-12 DIAGNOSIS — C031 Malignant neoplasm of lower gum: Secondary | ICD-10-CM | POA: Diagnosis not present

## 2020-10-12 DIAGNOSIS — Z510 Encounter for antineoplastic radiation therapy: Secondary | ICD-10-CM | POA: Diagnosis not present

## 2020-10-15 ENCOUNTER — Inpatient Hospital Stay: Payer: Medicare Other | Admitting: Nutrition

## 2020-10-15 ENCOUNTER — Ambulatory Visit
Admission: RE | Admit: 2020-10-15 | Discharge: 2020-10-15 | Disposition: A | Discharge status: Home / Self Care | Payer: Medicare Other | Source: Ambulatory Visit | Attending: Radiation Oncology | Admitting: Radiation Oncology

## 2020-10-15 ENCOUNTER — Other Ambulatory Visit: Payer: Self-pay

## 2020-10-15 DIAGNOSIS — C031 Malignant neoplasm of lower gum: Secondary | ICD-10-CM | POA: Diagnosis not present

## 2020-10-15 DIAGNOSIS — Z510 Encounter for antineoplastic radiation therapy: Secondary | ICD-10-CM | POA: Diagnosis not present

## 2020-10-15 NOTE — Progress Notes (Signed)
80 year old male diagnosed with cancer of the lower gum.  He is receiving 33 fractions of radiation therapy and followed by Dr. Isidore Moos.  Chemotherapy currently on hold at this time secondary to an open wound on his forearm.  Past medical history includes A. fib and hypertension.  Medications were reviewed.  Labs were reviewed.  Height: 6 feet 0 inches. Weight: 223 pounds on June 27. Usual body weight: 232 pounds September 19, 2020. BMI: 30.24.  Patient had an NG tube for nutrition support secondary to neck fistula.  He has an open wound on his forearm.  His MD recently removed his feeding tube and reports good healing on his forearm.  He admits to weight loss since surgery. Currently his appetite is good and he is able to tolerate most soft foods including salmon, sloppy Joe's, lo mein, eggs, cottage cheese, applesauce, macaroni and cheese and mashed potatoes among other things.  He has been drinking Ensure and has muscle milk that he would like to try.  He denies other nutrition impact symptoms.  Nutrition diagnosis: Unintended weight loss related to cancer of the lower gum and associated treatments as evidenced by 4% weight loss from usual body weight.  Intervention: Educated on strategies for consuming high-calorie, high-protein meals plus snacks.  Reviewed high-protein foods. Encouraged weight maintenance. Provided nutrition facts sheets. Recommended patient choose Ensure complete twice daily to provide additional nutrients for healing.  Provided samples and coupons. Contact information provided.  Monitoring, evaluation, goals: Patient will tolerate adequate calories and protein to minimize weight loss.  Next visit: To be scheduled weekly with treatment.  **Disclaimer: This note was dictated with voice recognition software. Similar sounding words can inadvertently be transcribed and this note may contain transcription errors which may not have been corrected upon publication of  note.**

## 2020-10-15 NOTE — Progress Notes (Signed)
Oncology Nurse Navigator Documentation   To provide support, encouragement and care continuity, met with Mr. Si for his initial RT.   I reviewed the 2-step treatment process, answered questions.  Mr. Yawn completed treatment without difficulty, denied questions/concerns. I reviewed the registration/arrival procedure for subsequent treatments. I encouraged them to call me with questions/concerns as tmts proceed.   Harlow Asa RN, BSN, OCN Head & Neck Oncology Nurse [place] at [facility] Phone # [phone]  Fax # [phone]

## 2020-10-16 ENCOUNTER — Other Ambulatory Visit: Payer: Self-pay

## 2020-10-16 ENCOUNTER — Ambulatory Visit
Admission: RE | Admit: 2020-10-16 | Discharge: 2020-10-16 | Disposition: A | Discharge status: Home / Self Care | Payer: Medicare Other | Source: Ambulatory Visit | Attending: Radiation Oncology | Admitting: Radiation Oncology

## 2020-10-16 DIAGNOSIS — C031 Malignant neoplasm of lower gum: Secondary | ICD-10-CM | POA: Diagnosis not present

## 2020-10-16 DIAGNOSIS — Z510 Encounter for antineoplastic radiation therapy: Secondary | ICD-10-CM | POA: Diagnosis not present

## 2020-10-16 MED ORDER — SONAFINE EX EMUL
1.0000 | Freq: Two times a day (BID) | CUTANEOUS | Status: DC
Start: 2020-10-16 — End: 2020-10-17
  Administered 2020-10-16: 1 via TOPICAL

## 2020-10-16 NOTE — Progress Notes (Signed)
Pt here for patient teaching.    Pt given Radiation and You booklet, Managing Acute Radiation Side Effects for Head and Neck Cancer handout, skin care instructions, and Sonafine.    Reviewed areas of pertinence such as fatigue, hair loss, mouth changes, skin changes, throat changes, earaches, and taste changes .   Pt able to give teach back of to pat skin, use unscented/gentle soap, and drink plenty of water,apply Sonafine bid, avoid applying anything to skin within 4 hours of treatment, and to use an electric razor if they must shave.   Pt demonstrated understanding and verbalizes understanding of information given and will contact nursing with any questions or concerns.    Http://rtanswers.org/treatmentinformation/whattoexpect/index         

## 2020-10-17 ENCOUNTER — Ambulatory Visit
Admission: RE | Admit: 2020-10-17 | Discharge: 2020-10-17 | Disposition: A | Discharge status: Home / Self Care | Payer: Medicare Other | Source: Ambulatory Visit | Attending: Radiation Oncology | Admitting: Radiation Oncology

## 2020-10-17 DIAGNOSIS — C031 Malignant neoplasm of lower gum: Secondary | ICD-10-CM | POA: Diagnosis not present

## 2020-10-17 DIAGNOSIS — Z510 Encounter for antineoplastic radiation therapy: Secondary | ICD-10-CM | POA: Diagnosis not present

## 2020-10-18 ENCOUNTER — Other Ambulatory Visit: Payer: Self-pay

## 2020-10-18 ENCOUNTER — Ambulatory Visit
Admission: RE | Admit: 2020-10-18 | Discharge: 2020-10-18 | Disposition: A | Discharge status: Home / Self Care | Payer: Medicare Other | Source: Ambulatory Visit | Attending: Radiation Oncology | Admitting: Radiation Oncology

## 2020-10-18 DIAGNOSIS — Z510 Encounter for antineoplastic radiation therapy: Secondary | ICD-10-CM | POA: Diagnosis not present

## 2020-10-18 DIAGNOSIS — C031 Malignant neoplasm of lower gum: Secondary | ICD-10-CM | POA: Diagnosis not present

## 2020-10-19 ENCOUNTER — Ambulatory Visit
Admission: RE | Admit: 2020-10-19 | Discharge: 2020-10-19 | Disposition: A | Discharge status: Home / Self Care | Payer: Medicare Other | Source: Ambulatory Visit | Attending: Radiation Oncology | Admitting: Radiation Oncology

## 2020-10-19 DIAGNOSIS — C031 Malignant neoplasm of lower gum: Secondary | ICD-10-CM | POA: Insufficient documentation

## 2020-10-19 DIAGNOSIS — Z483 Aftercare following surgery for neoplasm: Secondary | ICD-10-CM | POA: Insufficient documentation

## 2020-10-19 DIAGNOSIS — Z510 Encounter for antineoplastic radiation therapy: Secondary | ICD-10-CM | POA: Diagnosis not present

## 2020-10-19 DIAGNOSIS — R293 Abnormal posture: Secondary | ICD-10-CM | POA: Insufficient documentation

## 2020-10-19 DIAGNOSIS — I890 Lymphedema, not elsewhere classified: Secondary | ICD-10-CM | POA: Insufficient documentation

## 2020-10-23 ENCOUNTER — Inpatient Hospital Stay (HOSPITAL_BASED_OUTPATIENT_CLINIC_OR_DEPARTMENT_OTHER): Payer: Medicare Other | Admitting: Hematology and Oncology

## 2020-10-23 ENCOUNTER — Encounter: Payer: Self-pay | Admitting: Hematology and Oncology

## 2020-10-23 ENCOUNTER — Other Ambulatory Visit: Payer: Self-pay

## 2020-10-23 ENCOUNTER — Other Ambulatory Visit: Payer: Self-pay | Admitting: Radiation Oncology

## 2020-10-23 ENCOUNTER — Ambulatory Visit
Admission: RE | Admit: 2020-10-23 | Discharge: 2020-10-23 | Disposition: A | Discharge status: Home / Self Care | Payer: Medicare Other | Source: Ambulatory Visit | Attending: Radiation Oncology | Admitting: Radiation Oncology

## 2020-10-23 VITALS — BP 114/66 | HR 82 | Temp 97.9°F | Resp 18 | Ht 72.0 in | Wt 225.2 lb

## 2020-10-23 DIAGNOSIS — Z483 Aftercare following surgery for neoplasm: Secondary | ICD-10-CM | POA: Diagnosis not present

## 2020-10-23 DIAGNOSIS — C031 Malignant neoplasm of lower gum: Secondary | ICD-10-CM | POA: Insufficient documentation

## 2020-10-23 DIAGNOSIS — Z510 Encounter for antineoplastic radiation therapy: Secondary | ICD-10-CM | POA: Diagnosis not present

## 2020-10-23 DIAGNOSIS — R293 Abnormal posture: Secondary | ICD-10-CM | POA: Diagnosis not present

## 2020-10-23 DIAGNOSIS — I890 Lymphedema, not elsewhere classified: Secondary | ICD-10-CM | POA: Diagnosis not present

## 2020-10-23 MED ORDER — LIDOCAINE VISCOUS HCL 2 % MT SOLN: OROMUCOSAL | 3 refills | Status: DC

## 2020-10-23 NOTE — Progress Notes (Signed)
[name]  Patient Care Team: Robert Pepper, MD as PCP - General (Family Medicine) [place], Robert Police, RN as Oncology Nurse Navigator Robert Gibson, MD as Consulting Physician (Radiation Oncology) Robert Ames, MD as Referring Physician (Otolaryngology)  CHIEF COMPLAINTS/PURPOSE OF CONSULTATION:  SCC gingivobuccal sulcus.  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  He is status post surgery by Robert [name] on 09/03/2020 and there is concern that there is a submandibular mass which may represent tumor replacing entire lymph node with extranodal extension or tumor deposit. Given this information, we discussed about considering adjuvant chemoradiation. However he has large open wound on his right forearm which was not infected however may pose a hindrance to chemotherapy. He is here for FU, right forearm site healing well, plenty of red granulation tissue, tendon partly covered. No evidence of infection. I have explained to the patient, we may be safely able to proceed with weekly cisplatin, but he says he doesn't want to take any chance that the skin wont heal and he has to start over again. Also he was told that radiation is the most important modality of treatment anyway in the adjuvant setting, He would like to wait another week or 10 days and see how the healing is going on before he can make a decision. He understands if chemo is done, its best to do along with radiation I will see him back in a week.  HISTORY OF PRESENTING ILLNESS:   Robert [name] 80 y.o. male is here because of recurrent SCC of the gingivobuccal sulcus.  Chronology  He had surgery by Robert [name] in Oct 2019 for a left buccal SCCa,  reconstructed with local mucosal flaps. He was lost to f/u.   In early 2022 he developed a new squamous cell carcinoma in the right gingivobuccal sulcus and a submandibular neck mass.  He presented to Robert [name] and underwent composite resection with bilateral neck dissection on 09/03/2020.  Pathology from this mass showed superficially invasive squamous cell carcinoma at the left premolar gingiva.  There is an invasive keratinizing squamous cell carcinoma well to moderately differentiated measuring 3.2 cm in maximum gross dimension, tumor focally involves the inked resection margin.  There is a comment that the tumor may actually represent an entirely replaced lymph node with extranodal extension or tumor deposit.  No lymphoid parenchyma was identified in the section.  The tumor extends to focally involve the inked resection margin, does not invade the edges and submandibular gland.  Final pathological classification was considered SBP T4a, PN 0 versus PN2a if submandibular gland is considered a lymph node.  There is also perineural invasion in the primary gingivobuccal sulcus area.  All specimen margins are negative for invasive tumor 31 lymph nodes negative for tumor.  He was discussed in our ENT tumor board for consideration of concurrent chemotherapy radiation after surgery given the        concern of the submandibular area was indeed a lymph node with extracapsular extension.  Interval History  Patient is here for FU to discuss role of chemo. Wound over the right forearm healing well. More red granulation tissue, no pus.  He is using saline for dressing. Neck fistula healed well. Right thigh graft site healing well.  Rest of the pertinent 10  point ROS reviewed and neg.  MEDICAL HISTORY:  Past Medical History:  Diagnosis Date   A-fib ([place])    HTN (hypertension)     SURGICAL HISTORY: Past Surgical History:  Procedure Laterality Date   DENTAL SURGERY      SOCIAL HISTORY: Social  History   Socioeconomic History   Marital status: Married    Spouse name: Not on file   Number of children: 2   Years of education: Not on file   Highest education level: Not on file  Occupational History   Not on file  Tobacco Use   Smoking status: Not on file   Smokeless tobacco: Former    Types: Nurse, children's Use: Former  Substance and Sexual Activity   Alcohol use: Yes    Comment: occ   Drug use: Never   Sexual activity: Not on file  Other Topics Concern   Not on file  Social History Narrative   Not on file   Social Determinants of Health   Financial Resource Strain: Low Risk    Difficulty of Paying Living Expenses: Not hard at all  Food Insecurity: No Food Insecurity   Worried About Charity fundraiser in the Last Year: Never true   [place] in the Last Year: Never true  Transportation Needs: No Transportation Needs   Lack of Transportation (Medical): No   Lack of Transportation (Non-Medical): No  Physical Activity: Not on file  Stress: Not on file  Social Connections: Moderately Integrated   Frequency of Communication with Friends and Family: Twice a week   Frequency of Social Gatherings with Friends and Family: Once a week   Attends Religious Services: Never   Marine scientist or Organizations: No   Attends Music therapist: 1 to 4 times per year   Marital Status: Married  Human resources officer Violence: Not At Risk   Fear of Current or Ex-Partner: No   Emotionally Abused: No   Physically Abused: No   Sexually Abused: No    FAMILY HISTORY: Family History  Problem Relation Age of Onset   COPD Mother    Hypertension Father     ALLERGIES:  has No Known Allergies.  MEDICATIONS:  Current Outpatient Medications  Medication Sig Dispense Refill   metoprolol tartrate (LOPRESSOR) 100 MG tablet Take 100 mg by mouth 2 (two) times daily.     rivaroxaban (XARELTO) 20 MG TABS tablet Take 1 tablet (20 mg total) by mouth daily  with supper. 90 tablet 3   Amino Acids-Protein Hydrolys (PRO-STAT) LIQD Give 1 Package by tube in the morning and at bedtime.     amoxicillin-clavulanate (AUGMENTIN) 875-125 MG tablet Take 1 tablet by mouth 2 (two) times daily.     gabapentin (NEURONTIN) 100 MG capsule Take 100 mg by mouth 3 (three) times daily. (Patient not taking: Reported on 10/23/2020)     No current facility-administered medications for this visit.     PHYSICAL EXAMINATION: ECOG PERFORMANCE STATUS: 1 - Symptomatic but completely ambulatory  Vitals:   10/23/20 1030  BP: 114/66  Pulse: 82  Resp: 18  Temp: 97.9 F (36.6 C)  SpO2: 98%    Filed Weights   10/23/20 1030  Weight: 225 lb 3.2 oz (102.2 kg)   GENERAL:alert, no distress and comfortable SKIN: right forearm site looks better with more red granulation tissue, tendon partly covered by granulation tissue. Neck site with no more drainage, right thigh site appears well. PSYCH:  alert & oriented x 3 with fluent speech NEURO: no focal motor/sensory deficits  LABORATORY DATA:  I have reviewed the data as listed No results found for: WBC, HGB, HCT, MCV, PLT   Chemistry   No results found for: NA, K, CL, CO2, BUN, CREATININE, GLU No results found for: CALCIUM, ALKPHOS, AST, ALT, BILITOT    RADIOGRAPHIC STUDIES: I have personally reviewed the radiological images as listed and agreed with the findings in the report. No results found.  All questions were answered. The patient knows to call the clinic with any problems, questions or concerns.     Benay Pike, MD 10/23/2020 11:46 AM

## 2020-10-24 ENCOUNTER — Ambulatory Visit
Admission: RE | Admit: 2020-10-24 | Discharge: 2020-10-24 | Disposition: A | Discharge status: Home / Self Care | Payer: Medicare Other | Source: Ambulatory Visit | Attending: Radiation Oncology | Admitting: Radiation Oncology

## 2020-10-24 DIAGNOSIS — I890 Lymphedema, not elsewhere classified: Secondary | ICD-10-CM | POA: Diagnosis not present

## 2020-10-24 DIAGNOSIS — Z483 Aftercare following surgery for neoplasm: Secondary | ICD-10-CM | POA: Diagnosis not present

## 2020-10-24 DIAGNOSIS — Z510 Encounter for antineoplastic radiation therapy: Secondary | ICD-10-CM | POA: Diagnosis not present

## 2020-10-24 DIAGNOSIS — R293 Abnormal posture: Secondary | ICD-10-CM | POA: Diagnosis not present

## 2020-10-24 DIAGNOSIS — C031 Malignant neoplasm of lower gum: Secondary | ICD-10-CM | POA: Diagnosis not present

## 2020-10-25 ENCOUNTER — Ambulatory Visit: Payer: Medicare Other | Admitting: Physical Therapy

## 2020-10-25 ENCOUNTER — Other Ambulatory Visit: Payer: Self-pay

## 2020-10-25 ENCOUNTER — Ambulatory Visit: Payer: Medicare Other

## 2020-10-25 ENCOUNTER — Ambulatory Visit
Admission: RE | Admit: 2020-10-25 | Discharge: 2020-10-25 | Disposition: A | Discharge status: Home / Self Care | Payer: Medicare Other | Source: Ambulatory Visit | Attending: Radiation Oncology | Admitting: Radiation Oncology

## 2020-10-25 ENCOUNTER — Encounter: Payer: Self-pay | Admitting: Physical Therapy

## 2020-10-25 DIAGNOSIS — I890 Lymphedema, not elsewhere classified: Secondary | ICD-10-CM | POA: Insufficient documentation

## 2020-10-25 DIAGNOSIS — C031 Malignant neoplasm of lower gum: Secondary | ICD-10-CM

## 2020-10-25 DIAGNOSIS — R1312 Dysphagia, oropharyngeal phase: Secondary | ICD-10-CM | POA: Insufficient documentation

## 2020-10-25 DIAGNOSIS — R471 Dysarthria and anarthria: Secondary | ICD-10-CM | POA: Insufficient documentation

## 2020-10-25 DIAGNOSIS — R293 Abnormal posture: Secondary | ICD-10-CM

## 2020-10-25 DIAGNOSIS — L599 Disorder of the skin and subcutaneous tissue related to radiation, unspecified: Secondary | ICD-10-CM

## 2020-10-25 DIAGNOSIS — Z483 Aftercare following surgery for neoplasm: Secondary | ICD-10-CM

## 2020-10-25 DIAGNOSIS — Z510 Encounter for antineoplastic radiation therapy: Secondary | ICD-10-CM | POA: Diagnosis not present

## 2020-10-25 NOTE — Therapy (Signed)
[facility] [address] [place], [place], [address] Phone: [phone]   Fax:  [phone]  Physical Therapy Evaluation  Patient Details  Name: Robert [name] MRN: [id number] Date of Birth: [birth year] Referring Provider (PT): Vertis Kelch Date: 10/25/2020   PT End of Session - 10/25/20 1050     Visit Number 1    Number of Visits 7    Date for PT Re-Evaluation 12/06/20    PT Start Time 1017    PT Stop Time 1051    PT Time Calculation (min) 34 min    Activity Tolerance Patient tolerated treatment well    Behavior During Therapy [facility] for tasks assessed/performed             Past Medical History:  Diagnosis Date   A-fib (HCC)    HTN (hypertension)     Past Surgical History:  Procedure Laterality Date   DENTAL SURGERY      There were no vitals filed for this visit.    Subjective Assessment - 10/25/20 1018     Subjective I think I am doing ok. I am still very swollen. I can not even close my mouth.    Pertinent History Cancer of lower gum, stage IVA (T4a, N2a, M0), presented with left buccal squamous cell carcinoma in 2019. A biopsy and reconstruction were completed in October 2019, 08/08/20 [name] followed up with Dr. Erroll Luna and mentioned a mass that he had noticed to his right anterior mandible in Jan 2022. Dr. Erroll Luna obtained a sample for cytology which revealed highly atypical squamous cells suspicious for [facility], 08/15/20 CT neck revealed revealed a large mass arising from the buccal mucosal surface of the right lower lip, compatible with recurrent squamous cell carcinoma. Chest CT taken on the same day showed no evidence of metastatic disease within the chest, 09/03/20 Dr. Hezzie Bump completed a through and through lip resection with marginal mandibulectomy, bilateral neck dissection and flap reconstruction, will receive 33 fractions of radiation to his right lower gum area and a lower dose to his bilateral neck.  Chemotherapy may be started at a later date. He started on 10/15/20 and will complete 11/29/20.    Patient Stated Goals to gain info from providers    Currently in Pain? No/denies    Pain Score 0-No pain                [facility] PT Assessment - 10/25/20 0001       Assessment   Medical Diagnosis cancer of lower gum    Referring Provider (PT) Squire    Onset Date/Surgical Date 09/03/20    Hand Dominance Left    Prior Therapy none      Precautions   Precautions Other (comment)    Precaution Comments active cancer      Restrictions   Weight Bearing Restrictions No      Balance Screen   Has the patient fallen in the past 6 months No    Has the patient had a decrease in activity level because of a fear of falling?  No    Is the patient reluctant to leave their home because of a fear of falling?  No      Home Nurse, mental health Private residence    Living Arrangements Spouse/significant other    Available Help at Discharge Family    Type of Home House      Prior Function   Level of Independence Independent  Vocation Retired    Leisure plays golf at least once a week, pt does a Statistician   Overall Cognitive Status Within Functional Limits for tasks assessed      Observation/Other Assessments   Observations swelling of chin and bilateral neck swelling with R worse than left      Functional Tests   Functional tests Sit to Stand      Sit to Stand   Comments 30 sec sit to stand: 9 reps which is below average for his age      Posture/Postural Control   Posture/Postural Control Postural limitations    Postural Limitations Rounded Shoulders;Forward head      ROM / Strength   AROM / PROM / Strength AROM      AROM   Overall AROM Comments shoulder ROM WFL    Cervical Flexion WFL    Cervical Extension 50% limited    Cervical - Right Side Bend 50% limited    Cervical - Left Side Bend WFL    Cervical - Right Rotation 25% limited     Cervical - Left Rotation 25% limited      Ambulation/Gait   Ambulation/Gait Yes    Ambulation/Gait Assistance 7: Independent    Ambulation Distance (Feet) 8 Feet    Gait Pattern Within Functional Limits   kyphotic posture              LYMPHEDEMA/ONCOLOGY QUESTIONNAIRE - 10/25/20 0001       Type   Cancer Type lower gum cancer      Lymphedema Assessments   Lymphedema Assessments Head and Neck      Head and Neck   4 cm superior to sternal notch around neck 40.2 cm    6 cm superior to sternal notch around neck 40.1 cm    8 cm superior to sternal notch around neck 43.8 cm                     Objective measurements completed on examination: See above findings.               PT Education - 10/25/20 1051     Education Details Neck ROM, importance of posture when sitting, standing and lying down, deep breathing, walking program and importance of staying active throughout treatment, CURE article on staying active, "Why exercise?" flyer, lymphedema and PT info    Person(s) Educated Patient    Methods Explanation;Handout    Comprehension Verbalized understanding                 PT Long Term Goals - 10/25/20 1138       PT LONG TERM GOAL #1   Title Pt will report a 50% reduction in swelling of chin and anterior neck to decrease risk of infeciton.    Time 6    Period Weeks    Status New    Target Date 12/06/20      PT LONG TERM GOAL #2   Title Pt will be independent in self MLD for long term management of edema.    Time 6    Period Weeks    Status New    Target Date 12/06/20      PT LONG TERM GOAL #3   Title Pt will obtain appropriate compression garments for long term management of edema.    Time 6    Period Weeks    Status New    Target Date  12/06/20      PT LONG TERM GOAL #4   Title Pt will demonstrate improve cervical extension that is only 25% limited to allow improved motion.    Baseline 50% limited    Time 6    Period Weeks     Status New    Target Date 12/06/20                Head and Neck Clinic Goals - 10/25/20 1138       Patient will be able to verbalize understanding of a home exercise program for cervical range of motion, posture, and walking.          Time 1    Period Days    Status Achieved      Patient will be able to verbalize understanding of proper sitting and standing posture.          Time 1    Period Days    Status Achieved      Patient will be able to verbalize understanding of lymphedema risk and availability of treatment for this condition.          Time 1    Period Days    Status Achieved                Plan - 10/25/20 1053     Clinical Impression Statement Pt reports to PT after undergoing a a lip resection with marginal mandibulectomy and bilateral neck dissection with flap reconstruction for treatment of lower gum cancer on 09/03/20. Pt has considerable swelling since his surgery. He reports the internal part is decreaseing but the external part seems to be remaining the same. He has limitations in regards to cervical ROM due to tightness in his neck following surgery. 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 Educated pt about signs and symptoms of lymphedema as well as anatomy and physiology of lymphatic system. Educated pt in importance of staying as active as possible throughout treatment to decrease fatigue as well as head and neck ROM exercises to decrease loss of ROM. Will see pt 1x/wk to address his lymphedema and increase his visits to 2x/wk once he completes radiation. Pt felt twice a week was too much for his schedule at this time since he is receiving radiation daily.    Stability/Clinical Decision Making Stable/Uncomplicated    Clinical Decision Making Low    Rehab Potential Good    PT Frequency 1x / week    PT Duration 6 weeks    PT Treatment/Interventions ADLs/Self Care Home Management;Therapeutic exercise;Patient/family education;Manual  techniques;Manual lymph drainage;Compression bandaging;Scar mobilization;Passive range of motion;Taping;Vasopneumatic Device    PT Next Visit Plan begin MLD to neck and instruct pt, give chip pack, do cervical PROM, scar mobilization pending how skin is doing from radiation    PT Home Exercise Plan head and neck ROM exercises    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 classified  Disorder of the skin and subcutaneous tissue related to radiation, unspecified  Aftercare following surgery for neoplasm  Abnormal posture  Malignant neoplasm of lower gum (HCC)     Problem List Patient Active Problem List   Diagnosis Date Noted   Cancer of  lower gum (HCC) 10/05/2020   Permanent atrial fibrillation (HCC) 10/22/2018   Non-rheumatic mitral regurgitation 10/22/2018   Obesity (BMI 30-39.9) 10/22/2018    Milagros Loll [facility] 10/25/2020, 11:41 AM  [facility] [address] [place], [place], [address] Phone: [phone]   Fax:  [phone]  Name: Bladen Bevilacqua MRN: [id number] Date of Birth: 1940/12/25  Leonette Most, PT 10/25/20 11:41 AM

## 2020-10-26 ENCOUNTER — Ambulatory Visit
Admission: RE | Admit: 2020-10-26 | Discharge: 2020-10-26 | Disposition: A | Discharge status: Home / Self Care | Payer: Medicare Other | Source: Ambulatory Visit | Attending: Radiation Oncology | Admitting: Radiation Oncology

## 2020-10-26 DIAGNOSIS — Z510 Encounter for antineoplastic radiation therapy: Secondary | ICD-10-CM | POA: Diagnosis not present

## 2020-10-26 DIAGNOSIS — R293 Abnormal posture: Secondary | ICD-10-CM | POA: Diagnosis not present

## 2020-10-26 DIAGNOSIS — C031 Malignant neoplasm of lower gum: Secondary | ICD-10-CM | POA: Diagnosis not present

## 2020-10-26 DIAGNOSIS — Z483 Aftercare following surgery for neoplasm: Secondary | ICD-10-CM | POA: Diagnosis not present

## 2020-10-26 DIAGNOSIS — I890 Lymphedema, not elsewhere classified: Secondary | ICD-10-CM | POA: Diagnosis not present

## 2020-10-26 NOTE — Patient Instructions (Signed)
SWALLOWING EXERCISES Do these until 6 months after your last day of radiation, then 2-3 times per week afterwards  Effortful Swallows - Press your tongue against the roof of your mouth for 3 seconds, then squeeze the muscles in your neck while you swallow your saliva or a sip of water - Repeat 10-15 times, 2-3 times a day, and use whenever you eat or drink  Pitch Raise - Repeat "he", once per second in as high of a pitch as you can - Repeat 20 times, 2-3 times a day  Tongue Stretch/Teeth Clean   (to add possibly first therapy session depending on rt lip swelling) - Move your tongue around the pocket between your gums and teeth, clockwise and then counter-clockwise - Repeat on the back side, clockwise and then counter-clockwise - Repeat 15-20 times, 2-3 times a day  "Super Swallow"  - Take a breath and hold it  - Bear down (like pushing your bowels)  - Swallow then IMMEDIATELY cough  - Repeat 10 times, 2-3 times a day

## 2020-10-26 NOTE — Therapy (Signed)
independence over 3 visits    Time 4 visits    Status New           SLP LONG TERM GOAL #2    Title Pt will tell SLP 3 overt s/s aspiration PNA with modified independence    Time 3 visits    Status New           SLP LONG TERM GOAL #3    Title Pt will tell SLP when to decr frequency of HEP    Time 6 visits    Status New               SLP Education - 10/25/20 2315       Education Details HEP procedure, late effects head/neck radiation on swallowing,     Person(s) Educated Patient;Caregiver(s)    Methods Explanation;Demonstration;Verbal cues;Handout    Comprehension Verbalized understanding;Returned demonstration;Verbal cues required;Need further instruction                             Plan - 10/25/20 2317       Clinical Impression Statement At this time pt swallowing is deemed WNL/WFL with soft solids as described in his FEES report from June 2022. See that report for details. SLP designed an individualized HEP for dysphagia and pt completed each exercise on their own with initial consistent min-mod cues faded to modifed independence. There are no overt s/s aspiration reported by pt at this time. Data indicate that pt's swallow ability will likely decrease over the course of radiation therapy and could very well decline over time following conclusion of their radiation therapy due to muscle disuse atrophy and/or muscle fibrosis. Pt will cont to need to be seen by SLP in order to assess safety of PO intake, assess the need for recommending any objective swallow assessment, and ensuring pt correctly  completes the individualized HEP.    Speech Therapy Frequency --   once approx every 4 weeks    Duration 12 weeks    Treatment/Interventions Aspiration precaution training;Pharyngeal strengthening exercises;Diet toleration management by SLP;Trials of upgraded texture/liquids;Patient/family education;Compensatory strategies;SLP instruction and feedback;Cueing hierarchy    Potential to Achieve Goals Good    SLP Home Exercise Plan provided today    Consulted and Agree with Plan of Care Patient                            Patient will benefit from skilled therapeutic intervention in order to improve the following deficits and impairments:   Dysphagia, oropharyngeal phase  Dysarthria and anarthria    Problem List Patient Active Problem List   Diagnosis Date Noted   Cancer of lower gum ([place]) 10/05/2020   Permanent atrial fibrillation ([place]) 10/22/2018   Non-rheumatic mitral regurgitation 10/22/2018   Obesity (BMI 30-39.9) 10/22/2018    [place] ,[place], [place]  10/26/2020, 12:06 AM  [facility] [address] [place], [place], [address] Phone: [phone]   Fax:  [phone]  Name: Robert [name] MRN: [id number] Date of Birth: 1940/05/28  [facility] [address] [place], [place], [address] Phone: [phone]   Fax:  [phone]  Speech Language Pathology Evaluation  Patient Details  Name: Robert [name] MRN: [id number] Date of Birth: Mar 29, 1941 Referring Provider (SLP): Eppie Gibson   Encounter Date: 10/25/2020   End of Session - 10/26/20 0005     Visit Number 1    Number of Visits 4    Date for SLP Re-Evaluation 01/24/21    SLP Start Time 1107    SLP Stop Time  1540    SLP Time Calculation (min) 35 min    Activity Tolerance Patient tolerated treatment well             Past Medical History:  Diagnosis Date   A-fib ([place])    HTN (hypertension)     Past Surgical History:  Procedure Laterality Date   DENTAL SURGERY      There were no vitals filed for this visit.       SLP Evaluation [facility] - 10/25/20 2356       SLP Visit Information   SLP Received On 10/25/20    Referring Provider (SLP) Eppie Gibson    Medical Diagnosis rt lower gum cancer      Subjective   Patient/Family Stated Goal pt would like to have as normal swallow as possible after tx ends      Pain Assessment   Pain Score 0-No pain      General Information   HPI Cancer of lower gum, stage IVA (T4a, N2a, M0), presented with left buccal squamous cell carcinoma in 2019. A biopsy and reconstruction were completed in October 2019, 08/08/20 Mr. [name] followed up with Dr. Vicie Mutters and mentioned a mass that he had noticed to his right anterior mandible in Jan 2022. Dr. Vicie Mutters obtained a sample for cytology which revealed highly atypical squamous cells suspicious for [facility], 08/15/20 CT neck revealed revealed a large mass arising from the buccal mucosal surface of the right lower lip, compatible with recurrent squamous cell carcinoma. Chest CT taken on the same [name] showed no evidence of metastatic disease within the chest, 09/03/20 Dr. Nicolette Bang completed a through and through lip  resection with marginal mandibulectomy, bilateral neck dissection and flap reconstruction, will receive 33 fractions of radiation to his right lower gum area and a lower dose to his bilateral neck. Chemotherapy may be started at a later date. He started on 10/15/20 and will complete 11/29/20.      Prior Functional Status   Cognitive/Linguistic Baseline Within functional limits      Cognition   Overall Cognitive Status Within Functional Limits for tasks assessed      Auditory Comprehension   Overall Auditory Comprehension Appears within functional limits for tasks assessed      Verbal Expression   Overall Verbal Expression Appears within functional limits for tasks assessed      Oral Motor/Sensory Function   Overall Oral Motor/Sensory Function Impaired at baseline    Labial ROM Within Functional Limits    Labial Symmetry Abnormal symmetry right;Abnormal symmetry left    Labial Strength --   DNT due to swelling   Labial Coordination Reduced    Lingual Symmetry Within Functional Limits    Lingual Strength Within Functional Limits    Lingual Coordination WFL    Velum Within Functional Limits    Overall Oral Motor/Sensory Function decr'd labial seal due to labial flap reconstruction      Motor Speech   Overall Motor Speech  [facility] [address] [place], [place], [address] Phone: [phone]   Fax:  [phone]  Speech Language Pathology Evaluation  Patient Details  Name: Robert [name] MRN: [id number] Date of Birth: Mar 29, 1941 Referring Provider (SLP): Eppie Gibson   Encounter Date: 10/25/2020   End of Session - 10/26/20 0005     Visit Number 1    Number of Visits 4    Date for SLP Re-Evaluation 01/24/21    SLP Start Time 1107    SLP Stop Time  1540    SLP Time Calculation (min) 35 min    Activity Tolerance Patient tolerated treatment well             Past Medical History:  Diagnosis Date   A-fib ([place])    HTN (hypertension)     Past Surgical History:  Procedure Laterality Date   DENTAL SURGERY      There were no vitals filed for this visit.       SLP Evaluation [facility] - 10/25/20 2356       SLP Visit Information   SLP Received On 10/25/20    Referring Provider (SLP) Eppie Gibson    Medical Diagnosis rt lower gum cancer      Subjective   Patient/Family Stated Goal pt would like to have as normal swallow as possible after tx ends      Pain Assessment   Pain Score 0-No pain      General Information   HPI Cancer of lower gum, stage IVA (T4a, N2a, M0), presented with left buccal squamous cell carcinoma in 2019. A biopsy and reconstruction were completed in October 2019, 08/08/20 Mr. [name] followed up with Dr. Vicie Mutters and mentioned a mass that he had noticed to his right anterior mandible in Jan 2022. Dr. Vicie Mutters obtained a sample for cytology which revealed highly atypical squamous cells suspicious for [facility], 08/15/20 CT neck revealed revealed a large mass arising from the buccal mucosal surface of the right lower lip, compatible with recurrent squamous cell carcinoma. Chest CT taken on the same [name] showed no evidence of metastatic disease within the chest, 09/03/20 Dr. Nicolette Bang completed a through and through lip  resection with marginal mandibulectomy, bilateral neck dissection and flap reconstruction, will receive 33 fractions of radiation to his right lower gum area and a lower dose to his bilateral neck. Chemotherapy may be started at a later date. He started on 10/15/20 and will complete 11/29/20.      Prior Functional Status   Cognitive/Linguistic Baseline Within functional limits      Cognition   Overall Cognitive Status Within Functional Limits for tasks assessed      Auditory Comprehension   Overall Auditory Comprehension Appears within functional limits for tasks assessed      Verbal Expression   Overall Verbal Expression Appears within functional limits for tasks assessed      Oral Motor/Sensory Function   Overall Oral Motor/Sensory Function Impaired at baseline    Labial ROM Within Functional Limits    Labial Symmetry Abnormal symmetry right;Abnormal symmetry left    Labial Strength --   DNT due to swelling   Labial Coordination Reduced    Lingual Symmetry Within Functional Limits    Lingual Strength Within Functional Limits    Lingual Coordination WFL    Velum Within Functional Limits    Overall Oral Motor/Sensory Function decr'd labial seal due to labial flap reconstruction      Motor Speech   Overall Motor Speech

## 2020-10-29 ENCOUNTER — Ambulatory Visit
Admission: RE | Admit: 2020-10-29 | Discharge: 2020-10-29 | Disposition: A | Discharge status: Home / Self Care | Payer: Medicare Other | Source: Ambulatory Visit | Attending: Radiation Oncology | Admitting: Radiation Oncology

## 2020-10-29 ENCOUNTER — Ambulatory Visit: Payer: Medicare Other

## 2020-10-29 ENCOUNTER — Other Ambulatory Visit: Payer: Self-pay

## 2020-10-29 DIAGNOSIS — R293 Abnormal posture: Secondary | ICD-10-CM

## 2020-10-29 DIAGNOSIS — C031 Malignant neoplasm of lower gum: Secondary | ICD-10-CM | POA: Diagnosis not present

## 2020-10-29 DIAGNOSIS — Z483 Aftercare following surgery for neoplasm: Secondary | ICD-10-CM | POA: Diagnosis not present

## 2020-10-29 DIAGNOSIS — L599 Disorder of the skin and subcutaneous tissue related to radiation, unspecified: Secondary | ICD-10-CM

## 2020-10-29 DIAGNOSIS — Z510 Encounter for antineoplastic radiation therapy: Secondary | ICD-10-CM | POA: Diagnosis not present

## 2020-10-29 DIAGNOSIS — I890 Lymphedema, not elsewhere classified: Secondary | ICD-10-CM | POA: Diagnosis not present

## 2020-10-29 NOTE — Therapy (Signed)
[facility] [facility] [address] [place], [place], [address] Phone: [phone]   Fax:  [phone]  Physical Therapy Treatment  Patient Details  Name: Robert [name] MRN: [id number] Date of Birth: 1940-08-15 Referring Provider (PT): Vertis Kelch Date: 10/29/2020   PT End of Session - 10/29/20 1407     Visit Number 2    Number of Visits 7    Date for PT Re-Evaluation 12/06/20    PT Start Time 1302    PT Stop Time 1406    PT Time Calculation (min) 64 min    Activity Tolerance Patient tolerated treatment well    Behavior During Therapy [facility] for tasks assessed/performed             Past Medical History:  Diagnosis Date   A-fib (HCC)    HTN (hypertension)     Past Surgical History:  Procedure Laterality Date   DENTAL SURGERY      There were no vitals filed for this visit.   Subjective Assessment - 10/29/20 1307     Subjective The most frustrating thing is not being able to close my mouth.    Pertinent History Cancer of lower gum, stage IVA (T4a, N2a, M0), presented with left buccal squamous cell carcinoma in 2019. A biopsy and reconstruction were completed in October 2019, 08/08/20 [name] followed up with Dr. Erroll Luna and mentioned a mass that he had noticed to his right anterior mandible in Jan 2022. Dr. Erroll Luna obtained a sample for cytology which revealed highly atypical squamous cells suspicious for [facility], 08/15/20 CT neck revealed revealed a large mass arising from the buccal mucosal surface of the right lower lip, compatible with recurrent squamous cell carcinoma. Chest CT taken on the same day showed no evidence of metastatic disease within the chest, 09/03/20 Dr. Hezzie Bump completed a through and through lip resection with marginal mandibulectomy, bilateral neck dissection and flap reconstruction, will receive 33 fractions of radiation to his right lower gum area and a lower dose to his bilateral neck. Chemotherapy may  be started at a later date. He started on 10/15/20 and will complete 11/29/20.    Patient Stated Goals to gain info from providers    Currently in Pain? No/denies                               [facility] Adult PT Treatment/Exercise - 10/29/20 0001       Manual Therapy   Manual Therapy Manual Lymphatic Drainage (MLD);Passive ROM    Manual Lymphatic Drainage (MLD) Short and long neck, SCF, bil shouder collectors, superficial and deep abdominals, bil axillary nodes, anterior throat , submandibular area, chin especially over area of most prominent Rt sided swelling, bil masseters, pre-and retroauricular nodes, suboccitpital nodes and then retraced all steps beginning to instruct pt while performing    Passive ROM In Supine: Into bil cervical side bending and rotation                         PT Long Term Goals - 10/25/20 1138       PT LONG TERM GOAL #1   Title Pt will report a 50% reduction in swelling of chin and anterior neck to decrease risk of infeciton.    Time 6    Period Weeks    Status New    Target Date 12/06/20      PT LONG TERM  GOAL #2   Title Pt will be independent in self MLD for long term management of edema.    Time 6    Period Weeks    Status New    Target Date 12/06/20      PT LONG TERM GOAL #3   Title Pt will obtain appropriate compression garments for long term management of edema.    Time 6    Period Weeks    Status New    Target Date 12/06/20      PT LONG TERM GOAL #4   Title Pt will demonstrate improve cervical extension that is only 25% limited to allow improved motion.    Baseline 50% limited    Time 6    Period Weeks    Status New    Target Date 12/06/20                   Plan - 10/29/20 1407     Clinical Impression Statement Today was pts first treatment since evaluation. Began manual lymph drainage of anterior throat and submandibular area along with bil cervical P/ROM with brief STM to posterior neck  towards end of session. Educated pt on basics of anatomy of lymphatic system and principles of MLD while performing this today. Pt reports neck feeling looser by end of session today.    Stability/Clinical Decision Making Stable/Uncomplicated    Rehab Potential Good    PT Frequency 1x / week    PT Duration 6 weeks    PT Treatment/Interventions ADLs/Self Care Home Management;Therapeutic exercise;Patient/family education;Manual techniques;Manual lymph drainage;Compression bandaging;Scar mobilization;Passive range of motion;Taping;Vasopneumatic Device    PT Next Visit Plan Cont and instruct pt in MLD to neck, give chip pack, do cervical PROM, scar mobilization pending how skin is doing from radiation    PT Home Exercise Plan head and neck ROM exercises    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 classified  Disorder of the skin and subcutaneous tissue related to radiation, unspecified  Aftercare following surgery for neoplasm  Abnormal posture  Malignant neoplasm of lower gum (HCC)     Problem List Patient Active Problem List   Diagnosis Date Noted   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/2020   Permanent atrial fibrillation (HCC) 10/22/2018   Non-rheumatic mitral regurgitation 10/22/2018   Obesity (BMI 30-39.9) 10/22/2018    Hermenia Bers, PTA 10/29/2020, 3:46 PM  [facility] [facility] [address] [place], [place], [address] Phone: [phone]   Fax:  [phone]  Name: Robert [name] MRN: [id number] Date of Birth: [birth year]

## 2020-10-30 ENCOUNTER — Ambulatory Visit: Payer: Medicare Other

## 2020-10-30 ENCOUNTER — Ambulatory Visit
Admission: RE | Admit: 2020-10-30 | Discharge: 2020-10-30 | Disposition: A | Discharge status: Home / Self Care | Payer: Medicare Other | Source: Ambulatory Visit | Attending: Radiation Oncology | Admitting: Radiation Oncology

## 2020-10-30 ENCOUNTER — Other Ambulatory Visit: Payer: Self-pay

## 2020-10-30 ENCOUNTER — Inpatient Hospital Stay: Payer: Medicare Other | Admitting: Hematology and Oncology

## 2020-10-30 DIAGNOSIS — Z483 Aftercare following surgery for neoplasm: Secondary | ICD-10-CM

## 2020-10-30 DIAGNOSIS — C031 Malignant neoplasm of lower gum: Secondary | ICD-10-CM

## 2020-10-30 DIAGNOSIS — Z510 Encounter for antineoplastic radiation therapy: Secondary | ICD-10-CM | POA: Diagnosis not present

## 2020-10-30 DIAGNOSIS — L599 Disorder of the skin and subcutaneous tissue related to radiation, unspecified: Secondary | ICD-10-CM

## 2020-10-30 DIAGNOSIS — I890 Lymphedema, not elsewhere classified: Secondary | ICD-10-CM

## 2020-10-30 DIAGNOSIS — R293 Abnormal posture: Secondary | ICD-10-CM

## 2020-10-30 NOTE — Patient Instructions (Addendum)
Manual Lymph Drainage for face/neck  Place hands on either side of neck and do 15-20 stationary circles  Place hands on areas just above collar bones and do 15-20 stationary circles Do stationary circles on both shoulders (about 10 times) Do stationary circles at both armpit areas (about 10 times) Take 5 deep breaths Standing at the head of the bed, do stationary circles on each side of the neck just below the jaw line (15-20) Standing at the head of the bed, do downward circles just underneath the chin (15-20) Standing at the head of the bed, do stationary circles on each side of the face on the cheeks just above the jaw line (15-20) Standing at the head of the bed, do stationary downward circles on each side of the face between the eyes and ears (15-20) Repeat step 1, 2, 3, and 4  Cancer Rehab [phone]    Do not slide on the skin Only give enough pressure to stretch the skin Make sure to always wash your hands prior to massage

## 2020-10-30 NOTE — Addendum Note (Signed)
Addended by: Wynelle Beckmann, Hilaria Ota L on: 10/30/2020 01:02 PM   Modules accepted: Orders

## 2020-10-30 NOTE — Therapy (Signed)
[place], [place], [address] Phone: [phone]   Fax:  [phone]  Physical Therapy Treatment  Patient Details  Name: Robert [name] MRN: [id number] Date of Birth: [birth year] Referring Provider (PT): Reita May Date: 10/30/2020   PT End of Session - 10/30/20 1108     Visit Number 3    Number of Visits 14    Date for PT Re-Evaluation 12/06/20    PT Start Time 1008    PT Stop Time 1107    PT Time Calculation (min) 59 min    Activity Tolerance Patient tolerated treatment well    Behavior During Therapy [facility] for tasks assessed/performed             Past Medical History:  Diagnosis Date   A-fib ([place])    HTN (hypertension)     Past Surgical History:  Procedure Laterality Date   DENTAL SURGERY      There were no vitals filed for this visit.   Subjective Assessment - 10/30/20 1010     Subjective I think the radiation is starting to catch up to me. I was shaving my cheeks this morning and it was burning. But my cheeks drive me crazy when the hair starts growing back in. My neck felt looser after yesterday.    Pertinent History Cancer of lower gum, stage IVA (T4a, N2a, M0), presented with left buccal squamous cell carcinoma in 2019. A biopsy and reconstruction were completed in October 2019, 08/08/20 Mr. [name] followed up with Dr. Vicie Mutters and mentioned a mass that he had noticed to his right anterior mandible in Jan 2022. Dr. Vicie Mutters obtained a sample for cytology which revealed highly atypical squamous cells suspicious for [facility], 08/15/20 CT neck revealed revealed a large mass arising from the buccal mucosal surface of the right lower lip, compatible with recurrent squamous cell carcinoma. Chest CT taken on the same day showed no evidence of metastatic disease within the chest, 09/03/20 Dr. Nicolette Bang completed a through and through lip resection with marginal mandibulectomy, bilateral neck  dissection and flap reconstruction, will receive 33 fractions of radiation to his right lower gum area and a lower dose to his bilateral neck. Chemotherapy may be started at a later date. He started on 10/15/20 and will complete 11/29/20.    Patient Stated Goals to gain info from providers    Currently in Pain? No/denies   just always feels tight at anterior throat                              [facility] Adult PT Treatment/Exercise - 10/30/20 0001       Manual Therapy   Manual Therapy Manual Lymphatic Drainage (MLD);Passive ROM;Edema management    Edema Management Made pt a chip pack and instructed him to try wearing this a few hours a day as able.    Manual Lymphatic Drainage (MLD) Short and long neck, SCF, bil shouder collectors, superficial and deep abdominals, bil axillary nodes, anterior throat , submandibular area, chin especially over area of most prominent Rt sided swelling (included scar massage here as well during), bil masseters, pre-and retroauricular nodes, suboccitpital nodes and then retraced all steps. Had pt return demonstration of each step in sequence. He required hand over hand technique to instruct in correct direction of stretch and pressure. He did well with not sliding on skin.    Passive ROM In Supine: Into bil cervical side  [place], [place], [address] Phone: [phone]   Fax:  [phone]  Physical Therapy Treatment  Patient Details  Name: Robert [name] MRN: [id number] Date of Birth: [birth year] Referring Provider (PT): Reita May Date: 10/30/2020   PT End of Session - 10/30/20 1108     Visit Number 3    Number of Visits 14    Date for PT Re-Evaluation 12/06/20    PT Start Time 1008    PT Stop Time 1107    PT Time Calculation (min) 59 min    Activity Tolerance Patient tolerated treatment well    Behavior During Therapy [facility] for tasks assessed/performed             Past Medical History:  Diagnosis Date   A-fib ([place])    HTN (hypertension)     Past Surgical History:  Procedure Laterality Date   DENTAL SURGERY      There were no vitals filed for this visit.   Subjective Assessment - 10/30/20 1010     Subjective I think the radiation is starting to catch up to me. I was shaving my cheeks this morning and it was burning. But my cheeks drive me crazy when the hair starts growing back in. My neck felt looser after yesterday.    Pertinent History Cancer of lower gum, stage IVA (T4a, N2a, M0), presented with left buccal squamous cell carcinoma in 2019. A biopsy and reconstruction were completed in October 2019, 08/08/20 Mr. [name] followed up with Dr. Vicie Mutters and mentioned a mass that he had noticed to his right anterior mandible in Jan 2022. Dr. Vicie Mutters obtained a sample for cytology which revealed highly atypical squamous cells suspicious for [facility], 08/15/20 CT neck revealed revealed a large mass arising from the buccal mucosal surface of the right lower lip, compatible with recurrent squamous cell carcinoma. Chest CT taken on the same day showed no evidence of metastatic disease within the chest, 09/03/20 Dr. Nicolette Bang completed a through and through lip resection with marginal mandibulectomy, bilateral neck  dissection and flap reconstruction, will receive 33 fractions of radiation to his right lower gum area and a lower dose to his bilateral neck. Chemotherapy may be started at a later date. He started on 10/15/20 and will complete 11/29/20.    Patient Stated Goals to gain info from providers    Currently in Pain? No/denies   just always feels tight at anterior throat                              [facility] Adult PT Treatment/Exercise - 10/30/20 0001       Manual Therapy   Manual Therapy Manual Lymphatic Drainage (MLD);Passive ROM;Edema management    Edema Management Made pt a chip pack and instructed him to try wearing this a few hours a day as able.    Manual Lymphatic Drainage (MLD) Short and long neck, SCF, bil shouder collectors, superficial and deep abdominals, bil axillary nodes, anterior throat , submandibular area, chin especially over area of most prominent Rt sided swelling (included scar massage here as well during), bil masseters, pre-and retroauricular nodes, suboccitpital nodes and then retraced all steps. Had pt return demonstration of each step in sequence. He required hand over hand technique to instruct in correct direction of stretch and pressure. He did well with not sliding on skin.    Passive ROM In Supine: Into bil cervical side  Aftercare following surgery for neoplasm  Abnormal posture  Malignant neoplasm of lower gum [facility])     Problem List Patient Active Problem List   Diagnosis Date Noted   Cancer of lower gum ([place]) 10/05/2020   Permanent atrial fibrillation ([place]) 10/22/2018   Non-rheumatic mitral regurgitation 10/22/2018   Obesity (BMI 30-39.9) 10/22/2018    Otelia Limes, PTA 10/30/2020, 12:39 PM  [place], [place], [address] Phone: [phone]   Fax:  [phone]  Name: Robert [name] MRN: [id number] Date of Birth: [birth year]

## 2020-10-31 ENCOUNTER — Ambulatory Visit
Admission: RE | Admit: 2020-10-31 | Discharge: 2020-10-31 | Disposition: A | Discharge status: Home / Self Care | Payer: Medicare Other | Source: Ambulatory Visit | Attending: Radiation Oncology | Admitting: Radiation Oncology

## 2020-10-31 DIAGNOSIS — Z483 Aftercare following surgery for neoplasm: Secondary | ICD-10-CM | POA: Diagnosis not present

## 2020-10-31 DIAGNOSIS — I890 Lymphedema, not elsewhere classified: Secondary | ICD-10-CM | POA: Diagnosis not present

## 2020-10-31 DIAGNOSIS — C031 Malignant neoplasm of lower gum: Secondary | ICD-10-CM | POA: Diagnosis not present

## 2020-10-31 DIAGNOSIS — Z510 Encounter for antineoplastic radiation therapy: Secondary | ICD-10-CM | POA: Diagnosis not present

## 2020-10-31 DIAGNOSIS — R293 Abnormal posture: Secondary | ICD-10-CM | POA: Diagnosis not present

## 2020-11-01 ENCOUNTER — Ambulatory Visit
Admission: RE | Admit: 2020-11-01 | Discharge: 2020-11-01 | Disposition: A | Discharge status: Home / Self Care | Payer: Medicare Other | Source: Ambulatory Visit | Attending: Radiation Oncology | Admitting: Radiation Oncology

## 2020-11-01 ENCOUNTER — Inpatient Hospital Stay: Payer: Medicare Other | Admitting: Nutrition

## 2020-11-01 ENCOUNTER — Other Ambulatory Visit: Payer: Self-pay

## 2020-11-01 DIAGNOSIS — R293 Abnormal posture: Secondary | ICD-10-CM | POA: Diagnosis not present

## 2020-11-01 DIAGNOSIS — C031 Malignant neoplasm of lower gum: Secondary | ICD-10-CM | POA: Diagnosis not present

## 2020-11-01 DIAGNOSIS — Z510 Encounter for antineoplastic radiation therapy: Secondary | ICD-10-CM | POA: Diagnosis not present

## 2020-11-01 DIAGNOSIS — Z483 Aftercare following surgery for neoplasm: Secondary | ICD-10-CM | POA: Diagnosis not present

## 2020-11-01 DIAGNOSIS — I890 Lymphedema, not elsewhere classified: Secondary | ICD-10-CM | POA: Diagnosis not present

## 2020-11-01 NOTE — Progress Notes (Signed)
Nutrition follow-up completed with patient after radiation therapy for cancer of the lower gum.   Today he completes 75 out of 33 radiation treatments.    Weight was documented as 224.2 pounds on July 12. Weight is stable from 223 pounds June 27.    Patient reports he tried Ensure complete and it is too thick but he is able to tolerate and drink original Ensure.  States he drinks 2-3 cartons of Ensure daily. He is beginning to experience taste alterations. Denies swallowing his difficult but he cannot eat or drink large amounts at a time.  Liquids will run out of his mouth.  Nutrition diagnosis: Unintended weight loss stabilized.  Intervention: Continue strategies for small, frequent meals and snacks with high-calorie, high-protein foods for weight maintenance. Educated on strategies to improve taste alterations. Continue original Ensure or thin Ensure complete to consistency patient tolerates.  Monitoring, evaluation, goals: Patient will tolerate adequate calories and protein to promote weight maintenance and healing.  Next visit: Thursday, July 21 after radiation treatment.  **Disclaimer: This note was dictated with voice recognition software. Similar sounding words can inadvertently be transcribed and this note may contain transcription errors which may not have been corrected upon publication of note.**

## 2020-11-02 ENCOUNTER — Ambulatory Visit
Admission: RE | Admit: 2020-11-02 | Discharge: 2020-11-02 | Disposition: A | Discharge status: Home / Self Care | Payer: Medicare Other | Source: Ambulatory Visit | Attending: Radiation Oncology | Admitting: Radiation Oncology

## 2020-11-02 DIAGNOSIS — Z510 Encounter for antineoplastic radiation therapy: Secondary | ICD-10-CM | POA: Diagnosis not present

## 2020-11-02 DIAGNOSIS — R293 Abnormal posture: Secondary | ICD-10-CM | POA: Diagnosis not present

## 2020-11-02 DIAGNOSIS — I890 Lymphedema, not elsewhere classified: Secondary | ICD-10-CM | POA: Diagnosis not present

## 2020-11-02 DIAGNOSIS — C031 Malignant neoplasm of lower gum: Secondary | ICD-10-CM | POA: Diagnosis not present

## 2020-11-02 DIAGNOSIS — Z483 Aftercare following surgery for neoplasm: Secondary | ICD-10-CM | POA: Diagnosis not present

## 2020-11-05 ENCOUNTER — Encounter: Payer: Self-pay | Admitting: Hematology and Oncology

## 2020-11-05 ENCOUNTER — Other Ambulatory Visit: Payer: Self-pay

## 2020-11-05 ENCOUNTER — Ambulatory Visit
Admission: RE | Admit: 2020-11-05 | Discharge: 2020-11-05 | Disposition: A | Discharge status: Home / Self Care | Payer: Medicare Other | Source: Ambulatory Visit | Attending: Radiation Oncology | Admitting: Radiation Oncology

## 2020-11-05 ENCOUNTER — Inpatient Hospital Stay (HOSPITAL_BASED_OUTPATIENT_CLINIC_OR_DEPARTMENT_OTHER): Payer: Medicare Other | Admitting: Hematology and Oncology

## 2020-11-05 VITALS — BP 107/59 | HR 73 | Temp 98.6°F | Resp 18 | Ht 72.0 in | Wt 223.6 lb

## 2020-11-05 DIAGNOSIS — C031 Malignant neoplasm of lower gum: Secondary | ICD-10-CM | POA: Diagnosis not present

## 2020-11-05 DIAGNOSIS — Z510 Encounter for antineoplastic radiation therapy: Secondary | ICD-10-CM | POA: Diagnosis not present

## 2020-11-05 DIAGNOSIS — R293 Abnormal posture: Secondary | ICD-10-CM | POA: Diagnosis not present

## 2020-11-05 DIAGNOSIS — Z483 Aftercare following surgery for neoplasm: Secondary | ICD-10-CM | POA: Diagnosis not present

## 2020-11-05 DIAGNOSIS — I890 Lymphedema, not elsewhere classified: Secondary | ICD-10-CM | POA: Diagnosis not present

## 2020-11-05 NOTE — Progress Notes (Signed)
[place] NOTE  Patient Care Team: London Pepper, MD as PCP - General (Family Medicine) Malmfelt, Stephani Police, RN as Oncology Nurse Navigator Eppie Gibson, MD as Consulting Physician (Radiation Oncology) Francina Ames, MD as Referring Physician (Otolaryngology)  CHIEF COMPLAINTS/PURPOSE OF CONSULTATION:  SCC gingivobuccal sulcus.  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  He is status post surgery by Dr.Waltonen on 09/03/2020 and there is concern that there is a submandibular mass which may represent tumor replacing entire lymph node with extranodal extension or tumor deposit. Given this information, we discussed about considering adjuvant chemoradiation. Initially we were worried about right fore arm skin graft site not healing well, so we started with radiation first with plans to add chemo later, but patient is also apprehensive about the skin site not healing well and wanted to wait on further chemo. Also he mentioned that since adjuvant radiation is the most important part of adjuvant treatment, he would want to definitely proceed with radiation alone. He has almost finished 3 weeks of radiation, so even if he were to agree to chemo now, he may get 2/3 weekly treatments. I discussed with him that we dont know how much additional benefit he might get with addition of chemo at this time,  During our last visit, I encouraged him to consider chemo but he wanted to wait Since there is questionable benefit, he would like to proceed with rad alone, and follow up with [place] as needed.  HISTORY OF PRESENTING ILLNESS:   Robert [name] 80 y.o. male is here because of recurrent SCC of the gingivobuccal  sulcus.  Chronology  He had surgery by Dr Vicie Mutters in Oct 2019 for a left buccal SCCa, reconstructed with local mucosal flaps. He was lost to f/u.   In early 2022 he developed a new squamous cell carcinoma in the right gingivobuccal sulcus and a submandibular neck mass.  He presented to Dr. Silvio Clayman and underwent composite resection with bilateral neck dissection on 09/03/2020.  Pathology from this mass showed superficially invasive squamous cell carcinoma at the left premolar gingiva.  There is an invasive keratinizing squamous cell carcinoma well to moderately differentiated measuring 3.2 cm in maximum gross dimension, tumor focally involves the inked resection margin.  There is a comment that the tumor may actually represent an entirely replaced lymph node with extranodal extension or tumor deposit.  No lymphoid parenchyma was identified in the section.  The tumor extends to focally involve the inked resection margin, does not invade the edges and submandibular gland.  Final pathological classification was considered SBP T4a, PN 0 versus PN2a if submandibular gland is considered a lymph node.  There is also perineural invasion in the primary gingivobuccal sulcus area.  All specimen margins are negative for invasive tumor 31 lymph nodes negative for tumor.  He was discussed in our ENT tumor board for consideration of concurrent chemotherapy radiation after surgery given the        concern of the submandibular area was indeed a lymph node with extracapsular extension.  Interval History He is here for FU He is done with 3 weeks of radiation, tolerating it well except for some skin changes He is able to eat Right forearm site with nice and pink granulation tissue, slowly healing. No other concerns  for infection.  Rest of the pertinent 10 point ROS reviewed and neg.  MEDICAL HISTORY:  Past Medical History:  Diagnosis Date   A-fib ([place])    HTN (hypertension)     SURGICAL HISTORY: Past Surgical  History:  Procedure Laterality Date   DENTAL SURGERY      SOCIAL HISTORY: Social History   Socioeconomic History   Marital status: Married    Spouse name: Not on file   Number of children: 2   Years of education: Not on file   Highest education level: Not on file  Occupational History   Not on file  Tobacco Use   Smoking status: Never   Smokeless tobacco: Former    Types: Nurse, children's Use: Former  Substance and Sexual Activity   Alcohol use: Yes    Comment: occ   Drug use: Never   Sexual activity: Not on file  Other Topics Concern   Not on file  Social History Narrative   Not on file   Social Determinants of Health   Financial Resource Strain: Low Risk    Difficulty of Paying Living Expenses: Not hard at all  Food Insecurity: No Food Insecurity   Worried About Charity fundraiser in the Last Year: Never true   Fairplay in the Last Year: Never true  Transportation Needs: No Transportation Needs   Lack of Transportation (Medical): No   Lack of Transportation (Non-Medical): No  Physical Activity: Not on file  Stress: Not on file  Social Connections: Moderately Integrated   Frequency of Communication with Friends and Family: Twice a week   Frequency of Social Gatherings with Friends and Family: Once a week   Attends Religious Services: Never   Marine scientist or Organizations: No   Attends Music therapist: 1 to 4 times per year   Marital Status: Married  Human resources officer Violence: Not At Risk   Fear of Current or Ex-Partner: No   Emotionally Abused: No   Physically Abused: No   Sexually Abused: No    FAMILY HISTORY: Family History  Problem Relation Age of Onset   COPD Mother    Hypertension Father     ALLERGIES:  has No Known Allergies.  MEDICATIONS:  Current Outpatient Medications  Medication Sig Dispense Refill   amoxicillin-clavulanate (AUGMENTIN) 875-125 MG tablet Take 1 tablet by mouth 2 (two) times daily.      gabapentin (NEURONTIN) 100 MG capsule Take 100 mg by mouth 3 (three) times daily. (Patient not taking: Reported on 10/23/2020)     lidocaine (XYLOCAINE) 2 % solution Patient: Mix 1part 2% viscous lidocaine, 1part H20. Swish & swallow 79mL of diluted mixture, 59min before meals and at bedtime, up to QID 200 mL 3   metoprolol tartrate (LOPRESSOR) 100 MG tablet Take 100 mg by mouth 2 (two) times daily.     rivaroxaban (XARELTO) 20 MG TABS tablet Take 1 tablet (20 mg total) by mouth daily with supper. 90 tablet 3   No current facility-administered medications for this visit.     PHYSICAL EXAMINATION: ECOG PERFORMANCE STATUS: 1 - Symptomatic but completely ambulatory  Vitals:   11/05/20 0832  BP: (!) 107/59  Pulse: 73  Resp: 18  Temp: 98.6 F (37 C)  SpO2: 99%    Filed Weights   11/05/20 0832  Weight: 223 lb 9.6 oz (101.4 kg)   GENERAL:alert, no distress and comfortable SKIN: right forearm site looks smaller but with  still significant gaping, nice healthy pink granulation tissue, tendon appears to be mostly covered Neck site and right thigh site are healed well.  LABORATORY DATA:  I have reviewed the data as listed No results found for: WBC, HGB, HCT, MCV, PLT   Chemistry   No results found for: NA, K, CL, CO2, BUN, CREATININE, GLU No results found for: CALCIUM, ALKPHOS, AST, ALT, BILITOT    RADIOGRAPHIC STUDIES: I have personally reviewed the radiological images as listed and agreed with the findings in the report. No results found.  All questions were answered. The patient knows to call the clinic with any problems, questions or concerns. I spent 20 minutes in the care of the patient including H and P, review of records, discussion about role of chemo at this point and surveillance recommendations.    Benay Pike, MD 11/05/2020 8:35 AM

## 2020-11-06 ENCOUNTER — Ambulatory Visit: Payer: Medicare Other | Admitting: Physical Therapy

## 2020-11-06 ENCOUNTER — Ambulatory Visit
Admission: RE | Admit: 2020-11-06 | Discharge: 2020-11-06 | Disposition: A | Discharge status: Home / Self Care | Payer: Medicare Other | Source: Ambulatory Visit | Attending: Radiation Oncology | Admitting: Radiation Oncology

## 2020-11-06 DIAGNOSIS — Z483 Aftercare following surgery for neoplasm: Secondary | ICD-10-CM | POA: Diagnosis not present

## 2020-11-06 DIAGNOSIS — I890 Lymphedema, not elsewhere classified: Secondary | ICD-10-CM | POA: Diagnosis not present

## 2020-11-06 DIAGNOSIS — C031 Malignant neoplasm of lower gum: Secondary | ICD-10-CM | POA: Diagnosis not present

## 2020-11-06 DIAGNOSIS — R293 Abnormal posture: Secondary | ICD-10-CM

## 2020-11-06 DIAGNOSIS — Z510 Encounter for antineoplastic radiation therapy: Secondary | ICD-10-CM | POA: Diagnosis not present

## 2020-11-06 DIAGNOSIS — L599 Disorder of the skin and subcutaneous tissue related to radiation, unspecified: Secondary | ICD-10-CM

## 2020-11-06 NOTE — Therapy (Signed)
[facility] [address] [place], [place], [address] Phone: [phone]   Fax:  [phone]  Physical Therapy Treatment  Patient Details  Name: Robert [name] MRN: [id number] Date of Birth: [birth year] Referring Provider (PT): Vertis Kelch Date: 11/06/2020   PT End of Session - 11/06/20 1646     Visit Number 4    Number of Visits 14    Date for PT Re-Evaluation 12/06/20    PT Start Time 1510    PT Stop Time 1605    PT Time Calculation (min) 55 min    Activity Tolerance Patient tolerated treatment well    Behavior During Therapy [facility] for tasks assessed/performed             Past Medical History:  Diagnosis Date   A-fib (HCC)    HTN (hypertension)     Past Surgical History:  Procedure Laterality Date   DENTAL SURGERY      There were no vitals filed for this visit.   Subjective Assessment - 11/06/20 1513     Subjective Pt will complete radiation on 11/29/2020.  He says that he feels looser after receiving the MLD treatment    Pertinent History Cancer of lower gum, stage IVA (T4a, N2a, M0), presented with left buccal squamous cell carcinoma in 2019. A biopsy and reconstruction were completed in October 2019, 08/08/20 Mr. Robert [name] followed up with Dr. Erroll Luna and mentioned a mass that he had noticed to his right anterior mandible in Jan 2022. Dr. Erroll Luna obtained a sample for cytology which revealed highly atypical squamous cells suspicious for [facility], 08/15/20 CT neck revealed revealed a large mass arising from the buccal mucosal surface of the right lower lip, compatible with recurrent squamous cell carcinoma. Chest CT taken on the same day showed no evidence of metastatic disease within the chest, 09/03/20 Dr. Hezzie Bump completed a through and through lip resection with marginal mandibulectomy, bilateral neck dissection and flap reconstruction, will receive 33 fractions of radiation to his right lower gum area and a lower  dose to his bilateral neck. Chemotherapy may be started at a later date. He started on 10/15/20 and will complete 11/29/20.    Currently in Pain? No/denies                               [facility] Adult PT Treatment/Exercise - 11/06/20 0001       Manual Therapy   Manual Therapy Manual Lymphatic Drainage (MLD);Edema management    Manual therapy comments encouraged neck ROM and mouth opening/closing/ chin retraction    Edema Management made a piece of small dotted foam on built up white foam for pt to wear at tip of chin reinforced contstant skin checks on radiated skin    Manual Lymphatic Drainage (MLD) Short and long neck, SCF, bil shouder collectors, superficial and deep abdominals, bil axillary nodes, anterior throat , submandibular area, chin especially over area of most prominent Rt sided swelling (included scar massage here as well during), bil masseters, pre-and retroauricular nodes, suboccitpital nodes and then retraced all steps                         PT Long Term Goals - 10/25/20 1138       PT LONG TERM GOAL #1   Title Pt will report a 50% reduction in swelling of chin and anterior neck to decrease risk of infeciton.  Time 6    Period Weeks    Status New    Target Date 12/06/20      PT LONG TERM GOAL #2   Title Pt will be independent in self MLD for long term management of edema.    Time 6    Period Weeks    Status New    Target Date 12/06/20      PT LONG TERM GOAL #3   Title Pt will obtain appropriate compression garments for long term management of edema.    Time 6    Period Weeks    Status New    Target Date 12/06/20      PT LONG TERM GOAL #4   Title Pt will demonstrate improve cervical extension that is only 25% limited to allow improved motion.    Baseline 50% limited    Time 6    Period Weeks    Status New    Target Date 12/06/20                   Plan - 11/06/20 1646     Clinical Impression Statement Pt is  devloping some redness of skin from radiation. Reinforced that he should not do any skin stretch on radiated skin as he is at risk for skin damage to this area. He says he has the strap from the radiation mask over the tip of his chin so focues on this area for MLD and use of compression chip pack.    Stability/Clinical Decision Making Stable/Uncomplicated    Rehab Potential Good    PT Frequency 2x / week    PT Duration 6 weeks    PT Treatment/Interventions ADLs/Self Care Home Management;Therapeutic exercise;Patient/family education;Manual techniques;Manual lymph drainage;Compression bandaging;Scar mobilization;Passive range of motion;Taping;Vasopneumatic Device    PT Next Visit Plan Cont and review MLD to neck, assess chip pack, cont cervical PROM, scar mobilization pending how skin is doing from radiation; add cervical stabs to HEP?             Patient will benefit from skilled therapeutic intervention in order to improve the following deficits and impairments:  Postural dysfunction, Increased fascial restricitons, Decreased range of motion, Decreased scar mobility, Increased edema  Visit Diagnosis: Lymphedema, not elsewhere classified  Disorder of the skin and subcutaneous tissue related to radiation, unspecified  Aftercare following surgery for neoplasm  Abnormal posture     Problem List Patient Active Problem List   Diagnosis Date Noted   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/2020   Permanent atrial fibrillation (HCC) 10/22/2018   Non-rheumatic mitral regurgitation 10/22/2018   Obesity (BMI 30-39.9) 10/22/2018   Bayard Hugger. Manson Passey PT  Donnetta Hail 11/06/2020, 4:50 PM  [facility] [address] [place], [place], [address] Phone: [phone]   Fax:  [phone]  Name: Robert [name] MRN: [id number] Date of Birth: 07-23-1940

## 2020-11-07 ENCOUNTER — Other Ambulatory Visit: Payer: Self-pay

## 2020-11-07 ENCOUNTER — Ambulatory Visit
Admission: RE | Admit: 2020-11-07 | Discharge: 2020-11-07 | Disposition: A | Discharge status: Home / Self Care | Payer: Medicare Other | Source: Ambulatory Visit | Attending: Radiation Oncology | Admitting: Radiation Oncology

## 2020-11-07 DIAGNOSIS — C031 Malignant neoplasm of lower gum: Secondary | ICD-10-CM | POA: Diagnosis not present

## 2020-11-07 DIAGNOSIS — I890 Lymphedema, not elsewhere classified: Secondary | ICD-10-CM | POA: Diagnosis not present

## 2020-11-07 DIAGNOSIS — Z510 Encounter for antineoplastic radiation therapy: Secondary | ICD-10-CM | POA: Diagnosis not present

## 2020-11-07 DIAGNOSIS — R293 Abnormal posture: Secondary | ICD-10-CM | POA: Diagnosis not present

## 2020-11-07 DIAGNOSIS — Z483 Aftercare following surgery for neoplasm: Secondary | ICD-10-CM | POA: Diagnosis not present

## 2020-11-08 ENCOUNTER — Ambulatory Visit
Admission: RE | Admit: 2020-11-08 | Discharge: 2020-11-08 | Disposition: A | Discharge status: Home / Self Care | Payer: Medicare Other | Source: Ambulatory Visit | Attending: Radiation Oncology | Admitting: Radiation Oncology

## 2020-11-08 ENCOUNTER — Inpatient Hospital Stay: Payer: Medicare Other | Admitting: Nutrition

## 2020-11-08 DIAGNOSIS — Z510 Encounter for antineoplastic radiation therapy: Secondary | ICD-10-CM | POA: Diagnosis not present

## 2020-11-08 DIAGNOSIS — R293 Abnormal posture: Secondary | ICD-10-CM | POA: Diagnosis not present

## 2020-11-08 DIAGNOSIS — I890 Lymphedema, not elsewhere classified: Secondary | ICD-10-CM | POA: Diagnosis not present

## 2020-11-08 DIAGNOSIS — Z483 Aftercare following surgery for neoplasm: Secondary | ICD-10-CM | POA: Diagnosis not present

## 2020-11-08 DIAGNOSIS — C031 Malignant neoplasm of lower gum: Secondary | ICD-10-CM | POA: Diagnosis not present

## 2020-11-08 NOTE — Progress Notes (Signed)
Nutrition follow-up completed with patient after radiation therapy for cancer of the lower gum.  His last radiation therapy appointment is scheduled for August 11.  Weight stable at 223.6 pounds July 18. Patient denies nausea, vomiting, constipation, and diarrhea. Reports taste alterations continue to worsen.  Describes orange juice and cherry pie with ice cream tasting different than before. Patient denies mouth sores. He is drinking 2 Ensure daily and reports they are too thick but he has not tried to thin them down with milk yet. Noted patient has decided not to have chemotherapy.  His forearm is healing and his tendon is mostly covered at this time.  Nutrition diagnosis: Unintended weight loss remains stable.  Intervention: Continue strategies for improving taste alterations.  Recommended continuing baking soda and salt water rinses. Recommended pineapple juice/papaya nectar for thickened saliva. Encouraged increased water intake. Continue Ensure Plus at least twice daily and increase to 3 times daily if oral intake decreases this week.  Try thinning Ensure Plus with milk.  Monitoring, evaluation, goals: Patient will tolerate adequate calories and protein to minimize weight loss.  Next visit: Thursday, July 28 after radiation therapy.  **Disclaimer: This note was dictated with voice recognition software. Similar sounding words can inadvertently be transcribed and this note may contain transcription errors which may not have been corrected upon publication of note.**

## 2020-11-09 ENCOUNTER — Ambulatory Visit: Payer: Medicare Other | Admitting: Physical Therapy

## 2020-11-09 ENCOUNTER — Ambulatory Visit
Admission: RE | Admit: 2020-11-09 | Discharge: 2020-11-09 | Disposition: A | Discharge status: Home / Self Care | Payer: Medicare Other | Source: Ambulatory Visit | Attending: Radiation Oncology | Admitting: Radiation Oncology

## 2020-11-09 ENCOUNTER — Other Ambulatory Visit: Payer: Self-pay

## 2020-11-09 DIAGNOSIS — R293 Abnormal posture: Secondary | ICD-10-CM

## 2020-11-09 DIAGNOSIS — C031 Malignant neoplasm of lower gum: Secondary | ICD-10-CM | POA: Diagnosis not present

## 2020-11-09 DIAGNOSIS — I890 Lymphedema, not elsewhere classified: Secondary | ICD-10-CM | POA: Diagnosis not present

## 2020-11-09 DIAGNOSIS — Z483 Aftercare following surgery for neoplasm: Secondary | ICD-10-CM | POA: Diagnosis not present

## 2020-11-09 DIAGNOSIS — L599 Disorder of the skin and subcutaneous tissue related to radiation, unspecified: Secondary | ICD-10-CM

## 2020-11-09 DIAGNOSIS — Z510 Encounter for antineoplastic radiation therapy: Secondary | ICD-10-CM | POA: Diagnosis not present

## 2020-11-09 NOTE — Therapy (Signed)
[place], [place], [address] Phone: [phone]   Fax:  [phone]  Physical Therapy Treatment  Patient Details  Name: Robert [name] MRN: [id number] Date of Birth: 01-09-1941 Referring Provider (PT): Reita May Date: 11/09/2020   PT End of Session - 11/09/20 1148     Visit Number 5    Number of Visits 14    Date for PT Re-Evaluation 12/06/20    PT Start Time 1100    PT Stop Time 1143    PT Time Calculation (min) 43 min    Activity Tolerance Patient tolerated treatment well    Behavior During Therapy [facility] for tasks assessed/performed             Past Medical History:  Diagnosis Date   A-fib ([place])    HTN (hypertension)     Past Surgical History:  Procedure Laterality Date   DENTAL SURGERY      There were no vitals filed for this visit.   Subjective Assessment - 11/09/20 1146     Subjective Pt has been trying to use the chip pack on his chin, he is not doing so well with the other chip pack    Pertinent History Cancer of lower gum, stage IVA (T4a, N2a, M0), presented with left buccal squamous cell carcinoma in 2019. A biopsy and reconstruction were completed in October 2019, 08/08/20 Mr. [name] followed up with Dr. Vicie Mutters and mentioned a mass that he had noticed to his right anterior mandible in Jan 2022. Dr. Vicie Mutters obtained a sample for cytology which revealed highly atypical squamous cells suspicious for [facility], 08/15/20 CT neck revealed revealed a large mass arising from the buccal mucosal surface of the right lower lip, compatible with recurrent squamous cell carcinoma. Chest CT taken on the same day showed no evidence of metastatic disease within the chest, 09/03/20 Dr. Nicolette Bang completed a through and through lip resection with marginal mandibulectomy, bilateral neck dissection and flap reconstruction, will receive 33 fractions of radiation to his right lower gum area and a lower dose  to his bilateral neck. Chemotherapy may be started at a later date. He started on 10/15/20 and will complete 11/29/20.    Currently in Pain? No/denies                               [facility] Adult PT Treatment/Exercise - 11/09/20 0001       Exercises   Exercises Other Exercises    Other Exercises  deep breathing , neck, upper thoracic and shoulder ROM for warm up      Manual Therapy   Manual Therapy Manual Lymphatic Drainage (MLD);Edema management    Edema Management gave pt piece of stockinette to put chip pack in to tie around head for compression at chin and cheek    Manual Lymphatic Drainage (MLD) Short and long neck, SCF, bil shouder collectors, superficial and deep abdominals, bil axillary nodes, anterior throat , submandibular area, chin especially over area of most prominent Rt sided swelling (included scar massage here as well during), bil masseters, pre-and retroauricular nodes, suboccitpital nodes and then retraced all steps avoided radiated skin. Extra time spent on right cheek and chin                         PT Long Term Goals - 10/25/20 1138       PT LONG TERM  GOAL #1   Title Pt will report a 50% reduction in swelling of chin and anterior neck to decrease risk of infeciton.    Time 6    Period Weeks    Status New    Target Date 12/06/20      PT LONG TERM GOAL #2   Title Pt will be independent in self MLD for long term management of edema.    Time 6    Period Weeks    Status New    Target Date 12/06/20      PT LONG TERM GOAL #3   Title Pt will obtain appropriate compression garments for long term management of edema.    Time 6    Period Weeks    Status New    Target Date 12/06/20      PT LONG TERM GOAL #4   Title Pt will demonstrate improve cervical extension that is only 25% limited to allow improved motion.    Baseline 50% limited    Time 6    Period Weeks    Status New    Target Date 12/06/20                    Plan - 11/09/20 1148     Clinical Impression Statement Palpable decongestion perceived at right cheek after MLD today.  Also encouraged pt to continue his neck stretching at home    Stability/Clinical Decision Making Stable/Uncomplicated    PT Treatment/Interventions ADLs/Self Care Home Management;Therapeutic exercise;Patient/family education;Manual techniques;Manual lymph drainage;Compression bandaging;Scar mobilization;Passive range of motion;Taping;Vasopneumatic Device    PT Next Visit Plan Cont and review MLD to neck, assess chip pack, cont cervical PROM, scar mobilization pending how skin is doing from radiation; add cervical stabs to HEP?    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 classified  Disorder of the skin and subcutaneous tissue related to radiation, unspecified  Aftercare following surgery for neoplasm  Abnormal posture     Problem List Patient Active Problem List   Diagnosis Date Noted   Cancer of lower gum ([place]) 10/05/2020   Permanent atrial fibrillation ([place]) 10/22/2018   Non-rheumatic mitral regurgitation 10/22/2018   Obesity (BMI 30-39.9) 10/22/2018   Donato Heinz. Owens Shark PT  Norwood Levo 11/09/2020, 11:50 AM  [place], [place], [address] Phone: [phone]   Fax:  [phone]  Name: Trice Aspinall MRN: [id number] Date of Birth: 1940/08/20

## 2020-11-12 ENCOUNTER — Ambulatory Visit
Admission: RE | Admit: 2020-11-12 | Discharge: 2020-11-12 | Disposition: A | Discharge status: Home / Self Care | Payer: Medicare Other | Source: Ambulatory Visit | Attending: Radiation Oncology | Admitting: Radiation Oncology

## 2020-11-12 ENCOUNTER — Other Ambulatory Visit: Payer: Self-pay

## 2020-11-12 DIAGNOSIS — R293 Abnormal posture: Secondary | ICD-10-CM | POA: Diagnosis not present

## 2020-11-12 DIAGNOSIS — Z510 Encounter for antineoplastic radiation therapy: Secondary | ICD-10-CM | POA: Diagnosis not present

## 2020-11-12 DIAGNOSIS — Z483 Aftercare following surgery for neoplasm: Secondary | ICD-10-CM | POA: Diagnosis not present

## 2020-11-12 DIAGNOSIS — C031 Malignant neoplasm of lower gum: Secondary | ICD-10-CM | POA: Diagnosis not present

## 2020-11-12 DIAGNOSIS — I890 Lymphedema, not elsewhere classified: Secondary | ICD-10-CM | POA: Diagnosis not present

## 2020-11-13 ENCOUNTER — Ambulatory Visit
Admission: RE | Admit: 2020-11-13 | Discharge: 2020-11-13 | Disposition: A | Discharge status: Home / Self Care | Payer: Medicare Other | Source: Ambulatory Visit | Attending: Radiation Oncology | Admitting: Radiation Oncology

## 2020-11-13 ENCOUNTER — Ambulatory Visit: Payer: Medicare Other

## 2020-11-13 DIAGNOSIS — Z483 Aftercare following surgery for neoplasm: Secondary | ICD-10-CM

## 2020-11-13 DIAGNOSIS — I890 Lymphedema, not elsewhere classified: Secondary | ICD-10-CM

## 2020-11-13 DIAGNOSIS — L599 Disorder of the skin and subcutaneous tissue related to radiation, unspecified: Secondary | ICD-10-CM

## 2020-11-13 DIAGNOSIS — C031 Malignant neoplasm of lower gum: Secondary | ICD-10-CM

## 2020-11-13 DIAGNOSIS — Z510 Encounter for antineoplastic radiation therapy: Secondary | ICD-10-CM | POA: Diagnosis not present

## 2020-11-13 DIAGNOSIS — R293 Abnormal posture: Secondary | ICD-10-CM

## 2020-11-13 NOTE — Therapy (Signed)
reduction in swelling of chin and anterior neck to decrease risk of infeciton.    Time 6    Period Weeks    Status New    Target Date 12/06/20      PT LONG TERM GOAL #2   Title Pt will be independent in self MLD for long term management of edema.    Time 6    Period Weeks    Status New    Target Date 12/06/20      PT LONG TERM GOAL #3   Title Pt will obtain appropriate compression garments for long term management of edema.    Time 6    Period Weeks    Status New    Target Date 12/06/20      PT LONG TERM GOAL #4   Title Pt will demonstrate improve cervical extension that is only 25% limited to allow improved motion.    Baseline 50% limited    Time 6    Period Weeks    Status New    Target Date 12/06/20                    Plan - 11/13/20 0955     Clinical Impression Statement Reviewed self MLD to head and neck using pt instructions and having pt perform each.  He required Visual, verbal and occasional tactile cues but improved with practice. Therapist then performed MLD on pt being careful with irradiated skin.  Performed PROM to cervical region after MLD.  Pt continues with significant swelling greatest at right chin and neck.    Stability/Clinical Decision Making Stable/Uncomplicated    Rehab Potential Good    PT Frequency 2x / week    PT Duration 6 weeks    PT Treatment/Interventions ADLs/Self Care Home Management;Therapeutic exercise;Patient/family education;Manual techniques;Manual lymph drainage;Compression bandaging;Scar mobilization;Passive range of motion;Taping;Vasopneumatic Device    PT Next Visit Plan Cont and review MLD to neck, assess chip pack, cont cervical PROM, scar mobilization pending how skin is doing from radiation; add cervical stabs to HEP?    PT Home Exercise Plan head and neck ROM exercises; wear chip pack and perform self MLD both daily    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 classified  Disorder of the skin and subcutaneous tissue related to radiation, unspecified  Aftercare following surgery for neoplasm  Abnormal posture  Malignant neoplasm of lower gum ([place])     Problem List Patient Active Problem List   Diagnosis Date Noted   Cancer of lower gum ([place]) 10/05/2020   Permanent atrial fibrillation ([place]) 10/22/2018   Non-rheumatic mitral regurgitation 10/22/2018   Obesity (BMI 30-39.9) 10/22/2018    Claris Pong 11/13/2020, 9:58 AM  [place] [employer] [place], [place], [address] Phone: [phone]   Fax:  [phone]  Name: Robert [name] MRN: [id number] Date of Birth: [birth year] Cheral Almas, PT 11/13/20 10:00 AM  [place], [place], [address] Phone: [phone]   Fax:  [phone]  Physical Therapy Treatment  Patient Details  Name: Robert [name] MRN: [id number] Date of Birth: [birth year] Referring Provider (PT): Reita May Date: 11/13/2020   PT End of Session - 11/13/20 0954     Visit Number 6    Number of Visits 14    Date for PT Re-Evaluation 12/06/20    PT Start Time 0856    PT Stop Time 0949    PT Time Calculation (min) 53 min    Activity Tolerance Patient tolerated treatment well    Behavior During Therapy [facility] for tasks assessed/performed             Past Medical History:  Diagnosis Date   A-fib ([place])    HTN (hypertension)     Past Surgical History:  Procedure Laterality Date   DENTAL SURGERY      There were no vitals filed for this visit.   Subjective Assessment - 11/13/20 0856     Subjective Still doing radiation treatments.  Skin is alittle sensitive around where the surgery was. Trying to do the MLD but not sure how I am doing. Still doing ROM exercises at home.    Pertinent History Cancer of lower gum, stage IVA (T4a, N2a, M0), presented with left buccal squamous cell carcinoma in 2019. A biopsy and reconstruction were completed in October 2019, 08/08/20 Mr. [name] followed up with Dr. Vicie Mutters and mentioned a mass that he had noticed to his right anterior mandible in Jan 2022. Dr. Vicie Mutters obtained a sample for cytology which revealed highly atypical squamous cells suspicious for [facility], 08/15/20 CT neck revealed revealed a large mass arising from the buccal mucosal surface of the right lower lip, compatible with recurrent squamous cell carcinoma. Chest CT taken on the same day showed no evidence of metastatic disease within the chest, 09/03/20 Dr. Nicolette Bang completed a through and through lip resection with marginal mandibulectomy, bilateral neck dissection and flap reconstruction, will  receive 33 fractions of radiation to his right lower gum area and a lower dose to his bilateral neck. Chemotherapy may be started at a later date. He started on 10/15/20 and will complete 11/29/20.    Currently in Pain? No/denies    Pain Score 0-No pain                               [facility] Adult PT Treatment/Exercise - 11/13/20 0001       Manual Therapy   Manual Therapy Manual Lymphatic Drainage (MLD)    Edema Management Reviewed self MLD with pt using instructions previously given to pt.  required VC's and TCs for proper stretch and technique    Manual Lymphatic Drainage (MLD) Short and long neck, SCF, bil shouder collectors, 5 diaphragmatic breaths, bil axillary nodes, anterior throat , submandibular area, chin especially over area of most prominent Rt sided swelling (included scar massage here as well during), bil masseters, pre-and retroauricular nodes, suboccitpital nodes and then retraced all steps.    Passive ROM manual traaction, subocipital release, PROM bilateral cervical rotation and SB                         PT Long Term Goals - 10/25/20 1138       PT LONG TERM GOAL #1   Title Pt will report a 50%

## 2020-11-14 ENCOUNTER — Other Ambulatory Visit: Payer: Self-pay

## 2020-11-14 ENCOUNTER — Ambulatory Visit
Admission: RE | Admit: 2020-11-14 | Discharge: 2020-11-14 | Disposition: A | Discharge status: Home / Self Care | Payer: Medicare Other | Source: Ambulatory Visit | Attending: Radiation Oncology | Admitting: Radiation Oncology

## 2020-11-14 DIAGNOSIS — C031 Malignant neoplasm of lower gum: Secondary | ICD-10-CM | POA: Diagnosis not present

## 2020-11-14 DIAGNOSIS — Z483 Aftercare following surgery for neoplasm: Secondary | ICD-10-CM | POA: Diagnosis not present

## 2020-11-14 DIAGNOSIS — I890 Lymphedema, not elsewhere classified: Secondary | ICD-10-CM | POA: Diagnosis not present

## 2020-11-14 DIAGNOSIS — Z510 Encounter for antineoplastic radiation therapy: Secondary | ICD-10-CM | POA: Diagnosis not present

## 2020-11-14 DIAGNOSIS — R293 Abnormal posture: Secondary | ICD-10-CM | POA: Diagnosis not present

## 2020-11-15 ENCOUNTER — Inpatient Hospital Stay: Payer: Medicare Other | Admitting: Nutrition

## 2020-11-15 ENCOUNTER — Ambulatory Visit
Admission: RE | Admit: 2020-11-15 | Discharge: 2020-11-15 | Disposition: A | Discharge status: Home / Self Care | Payer: Medicare Other | Source: Ambulatory Visit | Attending: Radiation Oncology | Admitting: Radiation Oncology

## 2020-11-15 DIAGNOSIS — C031 Malignant neoplasm of lower gum: Secondary | ICD-10-CM | POA: Diagnosis not present

## 2020-11-15 DIAGNOSIS — I890 Lymphedema, not elsewhere classified: Secondary | ICD-10-CM | POA: Diagnosis not present

## 2020-11-15 DIAGNOSIS — R293 Abnormal posture: Secondary | ICD-10-CM | POA: Diagnosis not present

## 2020-11-15 DIAGNOSIS — Z510 Encounter for antineoplastic radiation therapy: Secondary | ICD-10-CM | POA: Diagnosis not present

## 2020-11-15 DIAGNOSIS — Z483 Aftercare following surgery for neoplasm: Secondary | ICD-10-CM | POA: Diagnosis not present

## 2020-11-15 NOTE — Progress Notes (Signed)
Nutrition follow-up completed with patient after radiation therapy for cancer of the lower gum.  Patient's final radiation therapy appointment is scheduled for Thursday, August 11.  He is anxiously waiting completion.  Weight stable at 223.6 pounds July 25.  No change from last week. Patient continues to work to consume adequate calories and protein.  He is able to tolerate most textures but states his mouth is becoming more sore.  He is drinking Ensure daily, sometimes 2 cartons a day. Reports forearm continues to heal.  Nutrition diagnosis: Unintended weight loss is stable.  Intervention: Educated patient to consume Ensure Plus 2-3 cartons daily.  Continue oral intake as tolerated. Continue baking soda and salt water rinses. Provided 1 complementary case of Ensure Plus.  Monitoring, evaluation, goals: Patient will tolerate adequate calories and protein for continued weight maintenance.  Next visit: Tuesday, August 2 after radiation therapy.  **Disclaimer: This note was dictated with voice recognition software. Similar sounding words can inadvertently be transcribed and this note may contain transcription errors which may not have been corrected upon publication of note.**

## 2020-11-16 ENCOUNTER — Other Ambulatory Visit: Payer: Self-pay

## 2020-11-16 ENCOUNTER — Ambulatory Visit
Admission: RE | Admit: 2020-11-16 | Discharge: 2020-11-16 | Disposition: A | Discharge status: Home / Self Care | Payer: Medicare Other | Source: Ambulatory Visit | Attending: Radiation Oncology | Admitting: Radiation Oncology

## 2020-11-16 DIAGNOSIS — C031 Malignant neoplasm of lower gum: Secondary | ICD-10-CM | POA: Diagnosis not present

## 2020-11-16 DIAGNOSIS — I890 Lymphedema, not elsewhere classified: Secondary | ICD-10-CM | POA: Diagnosis not present

## 2020-11-16 DIAGNOSIS — Z510 Encounter for antineoplastic radiation therapy: Secondary | ICD-10-CM | POA: Diagnosis not present

## 2020-11-16 DIAGNOSIS — R293 Abnormal posture: Secondary | ICD-10-CM | POA: Diagnosis not present

## 2020-11-16 DIAGNOSIS — Z483 Aftercare following surgery for neoplasm: Secondary | ICD-10-CM | POA: Diagnosis not present

## 2020-11-16 MED ORDER — SONAFINE EX EMUL
1.0000 | Freq: Once | CUTANEOUS | Status: AC
Start: 2020-11-16 — End: 2020-11-16
  Administered 2020-11-16: 1 via TOPICAL

## 2020-11-19 ENCOUNTER — Ambulatory Visit
Admission: RE | Admit: 2020-11-19 | Discharge: 2020-11-19 | Disposition: A | Discharge status: Home / Self Care | Payer: Medicare Other | Source: Ambulatory Visit | Attending: Radiation Oncology | Admitting: Radiation Oncology

## 2020-11-19 DIAGNOSIS — Z483 Aftercare following surgery for neoplasm: Secondary | ICD-10-CM | POA: Diagnosis not present

## 2020-11-19 DIAGNOSIS — Z510 Encounter for antineoplastic radiation therapy: Secondary | ICD-10-CM | POA: Insufficient documentation

## 2020-11-19 DIAGNOSIS — I890 Lymphedema, not elsewhere classified: Secondary | ICD-10-CM | POA: Insufficient documentation

## 2020-11-19 DIAGNOSIS — R293 Abnormal posture: Secondary | ICD-10-CM | POA: Diagnosis not present

## 2020-11-19 DIAGNOSIS — C031 Malignant neoplasm of lower gum: Secondary | ICD-10-CM | POA: Insufficient documentation

## 2020-11-20 ENCOUNTER — Ambulatory Visit: Payer: Medicare Other

## 2020-11-20 ENCOUNTER — Inpatient Hospital Stay: Payer: Medicare Other | Attending: Radiation Oncology | Admitting: Nutrition

## 2020-11-20 ENCOUNTER — Ambulatory Visit
Admission: RE | Admit: 2020-11-20 | Discharge: 2020-11-20 | Disposition: A | Discharge status: Home / Self Care | Payer: Medicare Other | Source: Ambulatory Visit | Attending: Radiation Oncology | Admitting: Radiation Oncology

## 2020-11-20 ENCOUNTER — Other Ambulatory Visit: Payer: Self-pay

## 2020-11-20 DIAGNOSIS — R293 Abnormal posture: Secondary | ICD-10-CM

## 2020-11-20 DIAGNOSIS — L599 Disorder of the skin and subcutaneous tissue related to radiation, unspecified: Secondary | ICD-10-CM

## 2020-11-20 DIAGNOSIS — I890 Lymphedema, not elsewhere classified: Secondary | ICD-10-CM

## 2020-11-20 DIAGNOSIS — C031 Malignant neoplasm of lower gum: Secondary | ICD-10-CM | POA: Insufficient documentation

## 2020-11-20 DIAGNOSIS — Z483 Aftercare following surgery for neoplasm: Secondary | ICD-10-CM | POA: Diagnosis not present

## 2020-11-20 DIAGNOSIS — Z510 Encounter for antineoplastic radiation therapy: Secondary | ICD-10-CM | POA: Diagnosis not present

## 2020-11-20 NOTE — Progress Notes (Signed)
Nutrition follow-up completed with patient receiving radiation therapy for cancer of the lower gum. Weight documented as 222.4 pounds.  This is relatively stable. Taste alterations continue and he reports most foods taste either bland or salty. He is able to tolerate colas, ice tea, and root beer floats.  He has been trying to drink more fluids. He has been consuming 2-3 cartons of Ensure Plus a day.  Nutrition diagnosis: Unintended weight loss stable.  Intervention: Patient educated to increase Ensure Plus 3 times daily between meals. Continue baking soda and salt water rinses to help with thickened saliva. Focus on high-calorie, high-protein foods as well as fluids.  Monitoring, evaluation, goals: Patient will tolerate adequate calories and protein to minimize weight loss.  Next visit: Tuesday, August 9 after radiation therapy.  **Disclaimer: This note was dictated with voice recognition software. Similar sounding words can inadvertently be transcribed and this note may contain transcription errors which may not have been corrected upon publication of note.**

## 2020-11-20 NOTE — Therapy (Signed)
[facility] [address] [place], [place], [address] Phone: [phone]   Fax:  [phone]  Physical Therapy Treatment  Patient Details  Name: Robert [name] MRN: [id number] Date of Birth: 1940/12/09 Referring Provider (PT): Vertis Kelch Date: 11/20/2020   PT End of Session - 11/20/20 1007     Visit Number 7    Number of Visits 14    Date for PT Re-Evaluation 12/06/20    PT Start Time 0903    PT Stop Time 1000    PT Time Calculation (min) 57 min    Activity Tolerance Patient tolerated treatment well    Behavior During Therapy [facility] for tasks assessed/performed             Past Medical History:  Diagnosis Date   A-fib (HCC)    HTN (hypertension)     Past Surgical History:  Procedure Laterality Date   DENTAL SURGERY      There were no vitals filed for this visit.   Subjective Assessment - 11/20/20 0903     Subjective I think the radiation is catching up on me.  Everything feels really tight. I can still do things but can't do them as long. Radiation should end next Thursday. Have been wearing the pad on my chin and it seems like it softens up.  Trying the MLD but can't tell it is doing anything.    Pertinent History Cancer of lower gum, stage IVA (T4a, N2a, M0), presented with left buccal squamous cell carcinoma in 2019. A biopsy and reconstruction were completed in October 2019, 08/08/20 [name] followed up with Dr. Erroll Luna and mentioned a mass that he had noticed to his right anterior mandible in Jan 2022. Dr. Erroll Luna obtained a sample for cytology which revealed highly atypical squamous cells suspicious for [facility], 08/15/20 CT neck revealed revealed a large mass arising from the buccal mucosal surface of the right lower lip, compatible with recurrent squamous cell carcinoma. Chest CT taken on the same day showed no evidence of metastatic disease within the chest, 09/03/20 Dr. Hezzie Bump completed a through and  through lip resection with marginal mandibulectomy, bilateral neck dissection and flap reconstruction, will receive 33 fractions of radiation to his right lower gum area and a lower dose to his bilateral neck. Chemotherapy may be started at a later date. He started on 10/15/20 and will complete 11/29/20.                               [facility] Adult PT Treatment/Exercise - 11/20/20 0001       Shoulder Exercises: Supine   Horizontal ABduction Strengthening;Both;10 reps    Theraband Level (Shoulder Horizontal ABduction) Level 2 (Red)      Shoulder Exercises: Standing   External Rotation Strengthening;Both;10 reps    Theraband Level (Shoulder External Rotation) Level 2 (Red)    External Rotation Weight (lbs) limited motion on right    Extension Strengthening;Both;10 reps    Theraband Level (Shoulder Extension) Level 2 (Red)    Retraction Strengthening;Both;10 reps    Theraband Level (Shoulder Retraction) Level 2 (Red)      Manual Therapy   Manual Therapy Manual Lymphatic Drainage (MLD)    Edema Management Reviewed self MLD with pt using instructions previously given to pt.  required VC's and TCs for proper stretch and technique    Manual Lymphatic Drainage (MLD) After pt review of self MLD therapist continued MLD in  supine with head of bed raised focusing on anterior neck and chin and retracing all steps                    PT Education - 11/20/20 1007     Education Details Pt was educated in standing/supine postural exs with red band    Person(s) Educated Patient    Methods Demonstration;Handout    Comprehension Returned demonstration                 PT Long Term Goals - 10/25/20 1138       PT LONG TERM GOAL #1   Title Pt will report a 50% reduction in swelling of chin and anterior neck to decrease risk of infeciton.    Time 6    Period Weeks    Status New    Target Date 12/06/20      PT LONG TERM GOAL #2   Title Pt will be independent in  self MLD for long term management of edema.    Time 6    Period Weeks    Status New    Target Date 12/06/20      PT LONG TERM GOAL #3   Title Pt will obtain appropriate compression garments for long term management of edema.    Time 6    Period Weeks    Status New    Target Date 12/06/20      PT LONG TERM GOAL #4   Title Pt will demonstrate improve cervical extension that is only 25% limited to allow improved motion.    Baseline 50% limited    Time 6    Period Weeks    Status New    Target Date 12/06/20                   Plan - 11/20/20 1010     Clinical Impression Statement Review self MLD with pt having him perform each step.  He did best using 1 hand at a time on the opposite side.  He required occasional visual, VC's and TC's but is using better stretch and lighter technique. Therapist then perfomred MLD and completed it with emphasis on anterior neck/chin.  Pt was instructed in postural exs in standing and supine and was able to return demonstrate with good form.  His HEP was updated.  He is compliant with cervical ROM exs and performing MLD at home    Stability/Clinical Decision Making Stable/Uncomplicated    Rehab Potential Good    PT Frequency 2x / week    PT Duration 6 weeks    PT Treatment/Interventions ADLs/Self Care Home Management;Therapeutic exercise;Patient/family education;Manual techniques;Manual lymph drainage;Compression bandaging;Scar mobilization;Passive range of motion;Taping;Vasopneumatic Device    PT Next Visit Plan Cont and review MLD to neck, assess chip pack, cont cervical PROM, scar mobilization pending how skin is doing from radiation; review postural band exs    PT Home Exercise Plan head and neck ROM exercises; wear chip pack and perform self MLD  daily    Consulted and Agree with Plan of Care Patient             Patient will benefit from skilled therapeutic intervention in order to improve the following deficits and impairments:   Postural dysfunction, Increased fascial restricitons, Decreased range of motion, Decreased scar mobility, Increased edema  Visit Diagnosis: Lymphedema, not elsewhere classified  Disorder of the skin and subcutaneous tissue related to radiation, unspecified  Aftercare following surgery for neoplasm  Abnormal  posture  Malignant neoplasm of lower gum [facility])     Problem List Patient Active Problem List   Diagnosis Date Noted   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/2020   Permanent atrial fibrillation (HCC) 10/22/2018   Non-rheumatic mitral regurgitation 10/22/2018   Obesity (BMI 30-39.9) 10/22/2018    Kathryne Hitch Zakariye Nee 11/20/2020, 10:16 AM  [facility] [address] [place], [place], [address] Phone: [phone]   Fax:  [phone]  Name: Robert [name] MRN: [id number] Date of Birth: 04-18-1941 Alvira Monday, PT 11/20/20 10:18 AM

## 2020-11-20 NOTE — Patient Instructions (Addendum)
Access Code: [email or web address] URL: [email or web address] Date: 11/20/2020 Prepared by: Cheral Almas  Exercises Scapular Retraction with Resistance - 1 x daily - 3 x weekly - 1 sets - 10 reps Scapular Retraction with Resistance Advanced - 1 x daily - 3 x weekly - 1 sets - 10 reps Shoulder External Rotation and Scapular Retraction with Resistance - 1 x daily - 3 x weekly - 1 sets - 10 reps Supine Shoulder Horizontal Abduction with Resistance - 1 x daily - 3 x weekly - 1 sets - 10 reps

## 2020-11-21 ENCOUNTER — Ambulatory Visit: Payer: Medicare Other | Admitting: Cardiology

## 2020-11-21 ENCOUNTER — Ambulatory Visit
Admission: RE | Admit: 2020-11-21 | Discharge: 2020-11-21 | Disposition: A | Discharge status: Home / Self Care | Payer: Medicare Other | Source: Ambulatory Visit | Attending: Radiation Oncology | Admitting: Radiation Oncology

## 2020-11-21 ENCOUNTER — Encounter: Payer: Self-pay | Admitting: Cardiology

## 2020-11-21 VITALS — BP 103/70 | HR 83 | Temp 98.4°F | Resp 16 | Ht 72.0 in | Wt 223.8 lb

## 2020-11-21 DIAGNOSIS — I4821 Permanent atrial fibrillation: Secondary | ICD-10-CM

## 2020-11-21 DIAGNOSIS — I10 Essential (primary) hypertension: Secondary | ICD-10-CM

## 2020-11-21 DIAGNOSIS — Z483 Aftercare following surgery for neoplasm: Secondary | ICD-10-CM | POA: Diagnosis not present

## 2020-11-21 DIAGNOSIS — C031 Malignant neoplasm of lower gum: Secondary | ICD-10-CM | POA: Diagnosis not present

## 2020-11-21 DIAGNOSIS — I340 Nonrheumatic mitral (valve) insufficiency: Secondary | ICD-10-CM | POA: Diagnosis not present

## 2020-11-21 DIAGNOSIS — R293 Abnormal posture: Secondary | ICD-10-CM | POA: Diagnosis not present

## 2020-11-21 DIAGNOSIS — Z510 Encounter for antineoplastic radiation therapy: Secondary | ICD-10-CM | POA: Diagnosis not present

## 2020-11-21 DIAGNOSIS — I890 Lymphedema, not elsewhere classified: Secondary | ICD-10-CM | POA: Diagnosis not present

## 2020-11-21 NOTE — Progress Notes (Signed)
Primary Physician:  Farris Has, MD   Patient ID: Sharren Bridge, male    DOB: 1940-12-31, 80 y.o.   MRN: [id number]  Subjective:    Chief Complaint  Patient presents with  . Atrial Fibrillation  . Follow-up    2 months    HPI: Mallik Rizk  is a 80 y.o. male  with permanent atrial fibrillation that was originally diagnosed in 2009 when he presented with TIA.  Past medical history significant for hypertension.  He used to chew tobacco, quit in May 2018.  Patient has had malignancy of the head and neck and underwent extensive surgery and resection of part of the mandible and also his left and had bone graft and skin graft performed 09/03/2020 at [facility].  He is back on his usual medications, on his last office visit 6 months ago I was concerned about elevated A. fib heart rate.  He is asymptomatic from A fib.  He is back on Xarelto and also metoprolol, doing well, going through radiation therapy, he has 6 more radiation therapy left.  Past Medical History:  Diagnosis Date  . A-fib (HCC)   . HTN (hypertension)     Past Surgical History:  Procedure Laterality Date  . DENTAL SURGERY      Social History   Tobacco Use  . Smoking status: Never  . Smokeless tobacco: Former    Types: Chew  Substance Use Topics  . Alcohol use: Yes    Comment: occ  Marital Sttus: Married    Review of Systems  HENT:         Extensive skin and bone graft to his chin and jaw and neck.  Cardiovascular:  Negative for chest pain, dyspnea on exertion and leg swelling.  Gastrointestinal:  Negative for melena.   Objective:  Blood pressure 103/70, pulse 83, temperature 98.4 F (36.9 C), temperature source Temporal, resp. rate 16, height 6' (1.829 m), weight 223 lb 12.8 oz (101.5 kg), SpO2 97 %. Body mass index is 30.35 kg/m.  Vitals with BMI 11/21/2020 11/21/2020 11/05/2020  Height - 6\' 0"  6\' 0"   Weight - 223 lbs 13 oz 223 lbs 10 oz  BMI - 30.35 30.32  Systolic 103 94 107   Diastolic 70 66 59  Pulse 83 92 73       Physical Exam Vitals reviewed.  Constitutional:      Appearance: He is well-developed. He is obese.  HENT:     Head:     Comments: Surgical reconstruction of his jaw and neck noted. Neck:     Comments: Rigid neck, unable to make a JVD or carotid bruit.  Radiation induced skin changes. Cardiovascular:     Rate and Rhythm: Normal rate. Rhythm irregular.     Pulses: Normal pulses and intact distal pulses.     Heart sounds: Heart sounds are distant.  Pulmonary:     Effort: Pulmonary effort is normal. No accessory muscle usage or respiratory distress.     Breath sounds: Normal breath sounds.  Abdominal:     General: Bowel sounds are normal.     Palpations: Abdomen is soft.  Musculoskeletal:        General: Swelling (bilateral 1+ pitting edema) present. Normal range of motion.     Cervical back: Normal range of motion.  Skin:    General: Skin is warm and dry.  Neurological:     Mental Status: He is alert and oriented to person, place, and time.   Radiology: No  results found.  Laboratory examination:   External labs:  Lab 12/20/2019:  Hb 13.9/HCT 40.8, platelets 195.  Serum glucose 96 mg, BUN 13, creatinine 0.99, EGFR 73/89 mL, sodium 142, potassium 4.2, CMP otherwise normal.  Total cholesterol 137, triglycerides 79, HDL 45, LDL 77.   No Known Allergies   Current Outpatient Medications on File Prior to Visit  Medication Sig Dispense Refill  . metoprolol tartrate (LOPRESSOR) 100 MG tablet Take 100 mg by mouth 2 (two) times daily.    . rivaroxaban (XARELTO) 20 MG TABS tablet Take 1 tablet (20 mg total) by mouth daily with supper. 90 tablet 3   No current facility-administered medications on file prior to visit.     Cardiac Studies:   Echocardiogram 04/26/2019: Left ventricle cavity is normal in size. Moderate concentric hypertrophy of the left ventricle. Normal global wall motion. Normal LV systolic function with visual EF  50-55%. Unable to evaluate diastolic function due to atrial fibrillation.  Left atrial cavity is severely dilated. Trileaflet aortic valve.  Mild (Grade I) aortic regurgitation. Moderate (Grade III) mitral regurgitation. Mild tricuspid regurgitation.  No evidence of pulmonary hypertension. No significant change compared to previous study on 10/14/2017.  Lexiscan myoview stress test 04/27/2017: 1. Pharmacologic stress testing was performed with intravenous administration of .4 mg of Lexiscan over a 10-15 seconds infusion. Stress EKG is non diagnostic for ischemia as it is a pharmacologic stress. 2. The overall quality of the study is good.  Left ventricular cavity is noted to be normal on the rest and stress studies.  Gated SPECT images reveal normal myocardial thickening and wall motion.  The left ventricular ejection fraction was calculated or visually estimated to be 68%.  SPECT images demonstrate small perfusion abnormality of mild intensity in the mid inferior myocardial wall(s) on the stress images.   3. This is a low risk study.     EKG:  EKG 12/18/2020: Atrial fibrillation with controlled ventricular response at rate of 89 bpm, normal axis, incomplete right bundle branch block.  Poor R wave progression, low-voltage complexes.  Pulmonary disease pattern.   No significant change from 09/19/2020, ventricular rate is better controlled.  Assessment:     ICD-10-CM   1. Permanent atrial fibrillation (HCC)  I48.21 EKG 12-Lead    2. Primary hypertension  I10     3. Moderate mitral regurgitation  I34.0       CHA2DS2-VASc Score is 5.  Yearly risk of stroke: 7.2% (A, HTN, Prior CVA).  Score of 1=0.6; 2=2.2; 3=3.2; 4=4.8; 5=7.2; 6=9.8; 7=>9.8) -(CHF; HTN; vasc disease DM,  Male = 1; Age <65 =0; 65-74 = 1,  >75 =2; stroke/embolism= 2).    No orders of the defined types were placed in this encounter.  Medications Discontinued During This Encounter  Medication Reason  . gabapentin  (NEURONTIN) 100 MG capsule Error  . lidocaine (XYLOCAINE) 2 % solution Error  . amoxicillin-clavulanate (AUGMENTIN) 875-125 MG tablet Error     Current Outpatient Medications  Medication Instructions  . metoprolol tartrate (LOPRESSOR) 100 mg, Oral, 2 times daily  . rivaroxaban (XARELTO) 20 mg, Oral, Daily with supper    Recommendations:   Kendell Berding is a 80 y.o. with permanent atrial fibrillation that was originally diagnosed in 2009 when he presented with TIA.  Past medical history significant for hypertension.  He used to chew tobacco, quit in May 2018.  Patient has had malignancy of the head and neck and underwent extensive surgery and resection of part of the mandible and  also his left and had bone graft and skin graft performed 09/03/2020 at [facility].  He is back on his usual medications, on his last office visit 6 months ago I was concerned about elevated A. fib heart rate.  Atrial fibrillation is now rate controlled.  No clinical evidence of heart failure.  He has distant heart sounds.  He does need follow-up of echocardiogram for specifically evaluation of mitral regurgitation and also for LVEF.  Otherwise blood pressure is well controlled and he remains asymptomatic.  I will see him back in 6 months for follow-up.  If he remains stable I will see him on an annual basis.     Yates Decamp, MD, [facility] 11/21/2020, 10:37 AM Office: [phone] Pager: [phone]

## 2020-11-22 ENCOUNTER — Other Ambulatory Visit: Payer: Self-pay

## 2020-11-22 ENCOUNTER — Ambulatory Visit
Admission: RE | Admit: 2020-11-22 | Discharge: 2020-11-22 | Disposition: A | Discharge status: Home / Self Care | Payer: Medicare Other | Source: Ambulatory Visit | Attending: Radiation Oncology | Admitting: Radiation Oncology

## 2020-11-22 ENCOUNTER — Ambulatory Visit: Payer: Medicare Other

## 2020-11-22 DIAGNOSIS — Z510 Encounter for antineoplastic radiation therapy: Secondary | ICD-10-CM | POA: Diagnosis not present

## 2020-11-22 DIAGNOSIS — R1312 Dysphagia, oropharyngeal phase: Secondary | ICD-10-CM

## 2020-11-22 DIAGNOSIS — C031 Malignant neoplasm of lower gum: Secondary | ICD-10-CM | POA: Diagnosis not present

## 2020-11-22 DIAGNOSIS — Z483 Aftercare following surgery for neoplasm: Secondary | ICD-10-CM | POA: Diagnosis not present

## 2020-11-22 DIAGNOSIS — R471 Dysarthria and anarthria: Secondary | ICD-10-CM

## 2020-11-22 DIAGNOSIS — R293 Abnormal posture: Secondary | ICD-10-CM | POA: Diagnosis not present

## 2020-11-22 DIAGNOSIS — I890 Lymphedema, not elsewhere classified: Secondary | ICD-10-CM | POA: Diagnosis not present

## 2020-11-22 NOTE — Therapy (Signed)
deficits and impairments:   Dysphagia, oropharyngeal phase  Dysarthria and anarthria    Problem List Patient Active Problem List   Diagnosis Date Noted   Cancer of lower gum ([place]) 10/05/2020   Permanent atrial fibrillation ([place]) 10/22/2018   Non-rheumatic mitral regurgitation 10/22/2018   Obesity (BMI 30-39.9) 10/22/2018    [place], [place]  11/22/2020, 5:17 PM  [place] [address] [place], [place], [address] Phone: [phone]   Fax:  [phone]   Name: Robert [name] MRN: [id number] Date of Birth: [birth year]  [place] [address] [place], [place], [address] Phone: [phone]   Fax:  [phone]  Speech Language Pathology Treatment  Patient Details  Name: Robert [name] MRN: [id number] Date of Birth: [birth year] Referring Provider (SLP): Eppie Gibson   Encounter Date: 11/22/2020   End of Session - 11/22/20 1716     Visit Number 2    Number of Visits 4    Date for SLP Re-Evaluation 01/24/21    SLP Start Time 0272    SLP Stop Time  5366    SLP Time Calculation (min) 40 min    Activity Tolerance Patient tolerated treatment well             Past Medical History:  Diagnosis Date   A-fib ([place])    HTN (hypertension)     Past Surgical History:  Procedure Laterality Date   DENTAL SURGERY      There were no vitals filed for this visit.   Subjective Assessment - 11/22/20 1328     Subjective Pt reports his speech is less intelligible to his wife in the last 2 weeks.    Currently in Pain? No/denies                   ADULT SLP TREATMENT - 11/22/20 1328       General Information   Behavior/Cognition Alert;Cooperative;Pleasant mood      Treatment Provided   Treatment provided Dysphagia      Dysphagia Treatment   Patient observed directly with PO's Yes    Type of PO's observed Regular;Thin liquids   ham sandwich cut into dice-size pieces -open face   Liquids provided via Cup    Oral Phase Signs & Symptoms Prolonged mastication;Prolonged bolus formation;Anterior loss/spillage   loss of saliva after 1 minute after end of POs; pt reports oral flushing with syringe necessary after each meal; talked to pt to volitionally swallow every 30-60 seconds after POs for 2-3 minutes to reduce this anterior leakage   Pharyngeal Phase Signs & Symptoms Other (comment)   no s/sx noted today   Other treatment/comments Pt ate hamburger (open face) with small bites, and fries yesterday, and eggs and toast today for  breakfast. He reports his labial seal when drinking is still worse with large cup than with a smaller cup so he has maintained liquids with smaller cup sizes. He uses a lingual lift on cup seal to simulate a lower labial seal on cup to mitigate labial leakage. He has completed all exercises mostly at recommeded frequencies. He was unable to produce ocugh with supersupraglottic so SLP modifed to strong throat clear which pt was able ot produce. SLP had to tell pt rationale for HEP. He was concerned aobut the hardened nature of his tissue in his neck and the swelling persistent with tissue around his mandible. He also mentioned disappointmetn at being unable to have labial seal. SLP referred him to his surgeon for questions about these areas of concern. SLP added pucker/retract lips exercise and pt thanked SLP.      Dysphagia Recommendations   Diet recommendations --   as tolerated   Liquids provided via Cup    Medication Administration --   as tolerated   Compensations Small sips/bites;Slow rate;Check for pocketing;Check for anterior loss      Progression Toward Goals   Progression toward goals Progressing toward goals              SLP Education - 11/22/20 1715  deficits and impairments:   Dysphagia, oropharyngeal phase  Dysarthria and anarthria    Problem List Patient Active Problem List   Diagnosis Date Noted   Cancer of lower gum ([place]) 10/05/2020   Permanent atrial fibrillation ([place]) 10/22/2018   Non-rheumatic mitral regurgitation 10/22/2018   Obesity (BMI 30-39.9) 10/22/2018    [place]  ,[place], [place]  11/22/2020, 5:17 PM  [place] [address] [place], [place], [address] Phone: [phone]   Fax:  [phone]   Name: Robert [name] MRN: [id number] Date of Birth: [birth year]

## 2020-11-22 NOTE — Patient Instructions (Signed)
    Add pucker and retract lips x10, x2 sets, twice a day

## 2020-11-23 ENCOUNTER — Ambulatory Visit
Admission: RE | Admit: 2020-11-23 | Discharge: 2020-11-23 | Disposition: A | Discharge status: Home / Self Care | Payer: Medicare Other | Source: Ambulatory Visit | Attending: Radiation Oncology | Admitting: Radiation Oncology

## 2020-11-23 DIAGNOSIS — Z483 Aftercare following surgery for neoplasm: Secondary | ICD-10-CM | POA: Diagnosis not present

## 2020-11-23 DIAGNOSIS — Z510 Encounter for antineoplastic radiation therapy: Secondary | ICD-10-CM | POA: Diagnosis not present

## 2020-11-23 DIAGNOSIS — C031 Malignant neoplasm of lower gum: Secondary | ICD-10-CM | POA: Diagnosis not present

## 2020-11-23 DIAGNOSIS — R293 Abnormal posture: Secondary | ICD-10-CM | POA: Diagnosis not present

## 2020-11-23 DIAGNOSIS — I890 Lymphedema, not elsewhere classified: Secondary | ICD-10-CM | POA: Diagnosis not present

## 2020-11-26 ENCOUNTER — Ambulatory Visit
Admission: RE | Admit: 2020-11-26 | Discharge: 2020-11-26 | Disposition: A | Discharge status: Home / Self Care | Payer: Medicare Other | Source: Ambulatory Visit | Attending: Radiation Oncology | Admitting: Radiation Oncology

## 2020-11-26 ENCOUNTER — Other Ambulatory Visit: Payer: Self-pay

## 2020-11-26 DIAGNOSIS — Z483 Aftercare following surgery for neoplasm: Secondary | ICD-10-CM | POA: Diagnosis not present

## 2020-11-26 DIAGNOSIS — I890 Lymphedema, not elsewhere classified: Secondary | ICD-10-CM | POA: Diagnosis not present

## 2020-11-26 DIAGNOSIS — C031 Malignant neoplasm of lower gum: Secondary | ICD-10-CM | POA: Diagnosis not present

## 2020-11-26 DIAGNOSIS — R293 Abnormal posture: Secondary | ICD-10-CM | POA: Diagnosis not present

## 2020-11-26 DIAGNOSIS — Z510 Encounter for antineoplastic radiation therapy: Secondary | ICD-10-CM | POA: Diagnosis not present

## 2020-11-27 ENCOUNTER — Ambulatory Visit
Admission: RE | Admit: 2020-11-27 | Discharge: 2020-11-27 | Disposition: A | Discharge status: Home / Self Care | Payer: Medicare Other | Source: Ambulatory Visit | Attending: Radiation Oncology | Admitting: Radiation Oncology

## 2020-11-27 ENCOUNTER — Inpatient Hospital Stay: Payer: Medicare Other | Admitting: Nutrition

## 2020-11-27 DIAGNOSIS — I890 Lymphedema, not elsewhere classified: Secondary | ICD-10-CM | POA: Diagnosis not present

## 2020-11-27 DIAGNOSIS — R293 Abnormal posture: Secondary | ICD-10-CM | POA: Diagnosis not present

## 2020-11-27 DIAGNOSIS — Z483 Aftercare following surgery for neoplasm: Secondary | ICD-10-CM | POA: Diagnosis not present

## 2020-11-27 DIAGNOSIS — Z510 Encounter for antineoplastic radiation therapy: Secondary | ICD-10-CM | POA: Diagnosis not present

## 2020-11-27 DIAGNOSIS — C031 Malignant neoplasm of lower gum: Secondary | ICD-10-CM | POA: Diagnosis not present

## 2020-11-27 NOTE — Progress Notes (Signed)
Nutrition follow-up completed with patient receiving radiation therapy for cancer of the lower gum. Weight decreased and documented as 217.6 pounds August 8 down from 222.4 pounds.  Patient reports "the doctor is mad at me. " Patient states he really does not have an appetite.  He denies trouble swallowing but admits to some difficulty chewing secondary to only 2 teeth on 1 side. Reports consuming 2 cartons of Ensure Plus daily.   He has been using baking soda and salt water rinses however he does not know if they are helping. Reports his wife made meatloaf with mashed potatoes and a lot of butter.  Reports he was able to eat this without difficulty.  States he was able to taste green beans which his wife made in a stew of some kind.  Nutrition diagnosis: Unintended weight loss continues.  Intervention: Educated patient on importance of increasing oral nutrition supplements to a minimum of 3 but preferably 4 cartons daily. Provided several other high-calorie nutrition shakes for him to try. Focused on adding calories to foods he can tolerate. Continue baking soda and salt water rinses. Provided support and encouragement.  Monitoring, evaluation, goals: Patient will tolerate increased calories and protein to minimize further weight loss.  Next visit: Telephone follow-up in approximately 1 month.  **Disclaimer: This note was dictated with voice recognition software. Similar sounding words can inadvertently be transcribed and this note may contain transcription errors which may not have been corrected upon publication of note.**

## 2020-11-28 ENCOUNTER — Other Ambulatory Visit: Payer: Self-pay

## 2020-11-28 ENCOUNTER — Ambulatory Visit
Admission: RE | Admit: 2020-11-28 | Discharge: 2020-11-28 | Disposition: A | Discharge status: Home / Self Care | Payer: Medicare Other | Source: Ambulatory Visit | Attending: Radiation Oncology | Admitting: Radiation Oncology

## 2020-11-28 DIAGNOSIS — I890 Lymphedema, not elsewhere classified: Secondary | ICD-10-CM | POA: Diagnosis not present

## 2020-11-28 DIAGNOSIS — R293 Abnormal posture: Secondary | ICD-10-CM | POA: Diagnosis not present

## 2020-11-28 DIAGNOSIS — Z483 Aftercare following surgery for neoplasm: Secondary | ICD-10-CM | POA: Diagnosis not present

## 2020-11-28 DIAGNOSIS — Z510 Encounter for antineoplastic radiation therapy: Secondary | ICD-10-CM | POA: Diagnosis not present

## 2020-11-28 DIAGNOSIS — C031 Malignant neoplasm of lower gum: Secondary | ICD-10-CM | POA: Diagnosis not present

## 2020-11-29 ENCOUNTER — Ambulatory Visit: Payer: Medicare Other | Admitting: Physical Therapy

## 2020-11-29 ENCOUNTER — Encounter: Payer: Self-pay | Admitting: Radiation Oncology

## 2020-11-29 ENCOUNTER — Other Ambulatory Visit: Payer: Self-pay

## 2020-11-29 ENCOUNTER — Encounter: Payer: Self-pay | Admitting: Physical Therapy

## 2020-11-29 ENCOUNTER — Ambulatory Visit
Admission: RE | Admit: 2020-11-29 | Discharge: 2020-11-29 | Disposition: A | Discharge status: Home / Self Care | Payer: Medicare Other | Source: Ambulatory Visit | Attending: Radiation Oncology | Admitting: Radiation Oncology

## 2020-11-29 DIAGNOSIS — L599 Disorder of the skin and subcutaneous tissue related to radiation, unspecified: Secondary | ICD-10-CM

## 2020-11-29 DIAGNOSIS — I890 Lymphedema, not elsewhere classified: Secondary | ICD-10-CM

## 2020-11-29 DIAGNOSIS — Z483 Aftercare following surgery for neoplasm: Secondary | ICD-10-CM

## 2020-11-29 DIAGNOSIS — R293 Abnormal posture: Secondary | ICD-10-CM

## 2020-11-29 DIAGNOSIS — C031 Malignant neoplasm of lower gum: Secondary | ICD-10-CM | POA: Diagnosis not present

## 2020-11-29 DIAGNOSIS — Z510 Encounter for antineoplastic radiation therapy: Secondary | ICD-10-CM | POA: Diagnosis not present

## 2020-11-29 NOTE — Therapy (Signed)
[place], [place], [address] Phone: [phone]   Fax:  [phone]  Physical Therapy Treatment  Patient Details  Name: Jousha Schwandt MRN: [id number] Date of Birth: December 23, 1940 Referring Provider (PT): Reita May Date: 11/29/2020   PT End of Session - 11/29/20 1134     Visit Number 8    Number of Visits 14    Date for PT Re-Evaluation 12/06/20    PT Start Time 1100    PT Stop Time 1125    PT Time Calculation (min) 25 min    Activity Tolerance Patient tolerated treatment well    Behavior During Therapy [facility] for tasks assessed/performed             Past Medical History:  Diagnosis Date   A-fib ([place])    HTN (hypertension)     Past Surgical History:  Procedure Laterality Date   DENTAL SURGERY      There were no vitals filed for this visit.   Subjective Assessment - 11/29/20 1103     Subjective I just finished radiation. I am red and I have a small area that is open. I have been trying the band exercises.    Pertinent History Cancer of lower gum, stage IVA (T4a, N2a, M0), presented with left buccal squamous cell carcinoma in 2019. A biopsy and reconstruction were completed in October 2019, 08/08/20 Mr. [name] followed up with Dr. Vicie Mutters and mentioned a mass that he had noticed to his right anterior mandible in Jan 2022. Dr. Vicie Mutters obtained a sample for cytology which revealed highly atypical squamous cells suspicious for [facility], 08/15/20 CT neck revealed revealed a large mass arising from the buccal mucosal surface of the right lower lip, compatible with recurrent squamous cell carcinoma. Chest CT taken on the same day showed no evidence of metastatic disease within the chest, 09/03/20 Dr. Nicolette Bang completed a through and through lip resection with marginal mandibulectomy, bilateral neck dissection and flap reconstruction, will receive 33 fractions of radiation to his right lower gum area and a  lower dose to his bilateral neck. Chemotherapy may be started at a later date. He started on 10/15/20 and will complete 11/29/20.    Patient Stated Goals to gain info from providers    Currently in Pain? No/denies    Pain Score 0-No pain                               [facility] Adult PT Treatment/Exercise - 11/29/20 0001       Shoulder Exercises: Standing   Horizontal ABduction Strengthening;Both;10 reps;Theraband    Theraband Level (Shoulder Horizontal ABduction) Level 2 (Red)    External Rotation Strengthening;Both;10 reps    Theraband Level (Shoulder External Rotation) Level 2 (Red)    External Rotation Weight (lbs) limited on R    External Rotation Limitations pt had pain in R shoulder and forearm with this exercise so educated pt to hold band with R hand and only pull with left and just do AROM ER on R    Extension Strengthening;Both;10 reps    Theraband Level (Shoulder Extension) Level 2 (Red)    Retraction Strengthening;Both;10 reps    Theraband Level (Shoulder Retraction) Level 2 (Red)                    PT Education - 11/29/20 1150     Education Details stop MLD for now to allow  Obesity (BMI 30-39.9) 10/22/2018    [facility] 11/29/2020, 11:51 AM  [place], [place], [address] Phone: [phone]   Fax:  [phone]  Name: Petar Mucci MRN: [id number] Date of Birth: May 24, 1940  Manus Gunning, PT 11/29/20 11:51 AM  [place], [place], [address] Phone: [phone]   Fax:  [phone]  Physical Therapy Treatment  Patient Details  Name: Jousha Schwandt MRN: [id number] Date of Birth: December 23, 1940 Referring Provider (PT): Reita May Date: 11/29/2020   PT End of Session - 11/29/20 1134     Visit Number 8    Number of Visits 14    Date for PT Re-Evaluation 12/06/20    PT Start Time 1100    PT Stop Time 1125    PT Time Calculation (min) 25 min    Activity Tolerance Patient tolerated treatment well    Behavior During Therapy [facility] for tasks assessed/performed             Past Medical History:  Diagnosis Date   A-fib ([place])    HTN (hypertension)     Past Surgical History:  Procedure Laterality Date   DENTAL SURGERY      There were no vitals filed for this visit.   Subjective Assessment - 11/29/20 1103     Subjective I just finished radiation. I am red and I have a small area that is open. I have been trying the band exercises.    Pertinent History Cancer of lower gum, stage IVA (T4a, N2a, M0), presented with left buccal squamous cell carcinoma in 2019. A biopsy and reconstruction were completed in October 2019, 08/08/20 Mr. [name] followed up with Dr. Vicie Mutters and mentioned a mass that he had noticed to his right anterior mandible in Jan 2022. Dr. Vicie Mutters obtained a sample for cytology which revealed highly atypical squamous cells suspicious for [facility], 08/15/20 CT neck revealed revealed a large mass arising from the buccal mucosal surface of the right lower lip, compatible with recurrent squamous cell carcinoma. Chest CT taken on the same day showed no evidence of metastatic disease within the chest, 09/03/20 Dr. Nicolette Bang completed a through and through lip resection with marginal mandibulectomy, bilateral neck dissection and flap reconstruction, will receive 33 fractions of radiation to his right lower gum area and a  lower dose to his bilateral neck. Chemotherapy may be started at a later date. He started on 10/15/20 and will complete 11/29/20.    Patient Stated Goals to gain info from providers    Currently in Pain? No/denies    Pain Score 0-No pain                               [facility] Adult PT Treatment/Exercise - 11/29/20 0001       Shoulder Exercises: Standing   Horizontal ABduction Strengthening;Both;10 reps;Theraband    Theraband Level (Shoulder Horizontal ABduction) Level 2 (Red)    External Rotation Strengthening;Both;10 reps    Theraband Level (Shoulder External Rotation) Level 2 (Red)    External Rotation Weight (lbs) limited on R    External Rotation Limitations pt had pain in R shoulder and forearm with this exercise so educated pt to hold band with R hand and only pull with left and just do AROM ER on R    Extension Strengthening;Both;10 reps    Theraband Level (Shoulder Extension) Level 2 (Red)    Retraction Strengthening;Both;10 reps    Theraband Level (Shoulder Retraction) Level 2 (Red)                    PT Education - 11/29/20 1150     Education Details stop MLD for now to allow

## 2020-12-11 ENCOUNTER — Other Ambulatory Visit: Payer: Self-pay

## 2020-12-11 ENCOUNTER — Ambulatory Visit: Payer: Medicare Other

## 2020-12-11 DIAGNOSIS — I890 Lymphedema, not elsewhere classified: Secondary | ICD-10-CM | POA: Diagnosis not present

## 2020-12-11 DIAGNOSIS — Z510 Encounter for antineoplastic radiation therapy: Secondary | ICD-10-CM | POA: Diagnosis not present

## 2020-12-11 DIAGNOSIS — R293 Abnormal posture: Secondary | ICD-10-CM | POA: Diagnosis not present

## 2020-12-11 DIAGNOSIS — Z483 Aftercare following surgery for neoplasm: Secondary | ICD-10-CM | POA: Diagnosis not present

## 2020-12-11 DIAGNOSIS — C031 Malignant neoplasm of lower gum: Secondary | ICD-10-CM

## 2020-12-11 DIAGNOSIS — L599 Disorder of the skin and subcutaneous tissue related to radiation, unspecified: Secondary | ICD-10-CM

## 2020-12-11 NOTE — Therapy (Signed)
[facility] [address] [place], [place], [address] Phone: [phone]   Fax:  [phone]  Physical Therapy Treatment  Patient Details  Name: Robert [name] MRN: [id number] Date of Birth: 06-18-1940 Referring Provider (PT): Vertis Kelch Date: 12/11/2020   PT End of Session - 12/11/20 1225     Visit Number 9    Number of Visits 14    Date for PT Re-Evaluation 01/08/21    PT Start Time 0905    PT Stop Time 0955    PT Time Calculation (min) 50 min    Activity Tolerance Patient tolerated treatment well    Behavior During Therapy [facility] for tasks assessed/performed             Past Medical History:  Diagnosis Date   A-fib (HCC)    HTN (hypertension)     Past Surgical History:  Procedure Laterality Date   DENTAL SURGERY      There were no vitals filed for this visit.   Subjective Assessment - 12/11/20 0905     Subjective Skin is doing a little better overall alot better than it was 2 weeks. I have been wearing the chip pack and it seems to help but it gets uncomfortable after an hour or 2.    Pertinent History Cancer of lower gum, stage IVA (T4a, N2a, M0), presented with left buccal squamous cell carcinoma in 2019. A biopsy and reconstruction were completed in October 2019, 08/08/20 [name] followed up with Dr. Erroll Luna and mentioned a mass that he had noticed to his right anterior mandible in Jan 2022. Dr. Erroll Luna obtained a sample for cytology which revealed highly atypical squamous cells suspicious for [facility], 08/15/20 CT neck revealed revealed a large mass arising from the buccal mucosal surface of the right lower lip, compatible with recurrent squamous cell carcinoma. Chest CT taken on the same day showed no evidence of metastatic disease within the chest, 09/03/20 Dr. Hezzie Bump completed a through and through lip resection with marginal mandibulectomy, bilateral neck dissection and flap reconstruction, will receive  33 fractions of radiation to his right lower gum area and a lower dose to his bilateral neck. Chemotherapy may be started at a later date. He started on 10/15/20 and will complete 11/29/20.    Patient Stated Goals to gain info from providers    Currently in Pain? No/denies    Pain Score 0-No pain                [facility] PT Assessment - 12/11/20 0001       AROM   Cervical Flexion WFL    Cervical Extension 60% limmited    Cervical - Right Side Bend 50% limited    Cervical - Left Side Bend 30 % limited    Cervical - Right Rotation 25% limited    Cervical - Left Rotation 30% better               LYMPHEDEMA/ONCOLOGY QUESTIONNAIRE - 12/11/20 0001       Type   Cancer Type lower gum cancer      Head and Neck   4 cm superior to sternal notch around neck 39.8 cm    6 cm superior to sternal notch around neck 39.8 cm    8 cm superior to sternal notch around neck 40.9 cm                        [facility] Adult PT Treatment/Exercise -  12/11/20 0001       Manual Therapy   Manual Therapy Soft tissue mobilization;Manual Lymphatic Drainage (MLD);Passive ROM    Soft tissue mobilization with coco butter;bilateral UT, Levator, posterior cervicals, scalenes,    Manual Lymphatic Drainage (MLD) short neck, SCF,  bil axillary nodes, bilateral chest, posterior,medial anterior neck into the river,anterior throat , submandibular area, chin especially over area of most prominent Rt sided swelling (included scar massage here as well during), bil masseters, pre-and retroauricular nodes, suboccitpital nodes and then retraced all steps.    Passive ROM manual traction, suboccipital release, PROM bilateral Rot and SB                         PT Long Term Goals - 12/11/20 1222       PT LONG TERM GOAL #1   Title Pt will report a 50% reduction in swelling of chin and anterior neck to decrease risk of infeciton.    Time 6    Period Weeks    Status On-going    Target Date  01/08/21      PT LONG TERM GOAL #2   Title Pt will be independent in self MLD for long term management of edema.    Baseline doing but needs review    Time 4    Period Weeks    Status On-going    Target Date 01/08/21      PT LONG TERM GOAL #3   Title Pt will obtain appropriate compression garments for long term management of edema.    Time 4    Period Weeks    Status On-going    Target Date 01/08/21                   Plan - 12/11/20 1225     Clinical Impression Statement Pts skin is improving and is less tender.  There is some visibly decreased edema however he continues with firm swelling at bilateral neck and jaw.  Cervical ROM is still limited but was improved after manual work today.  he has been compliant with his chip pack 1-2 hours per day and does get benefit from it, but swelling returns.  He has been doing MLD but does need review.  I have requested he be set up with Diannia Ruder at a measuring time to be fit for his garment.             Patient will benefit from skilled therapeutic intervention in order to improve the following deficits and impairments:     Visit Diagnosis: Abnormal posture  Aftercare following surgery for neoplasm  Disorder of the skin and subcutaneous tissue related to radiation, unspecified  Lymphedema, not elsewhere classified  Malignant neoplasm of lower gum [facility])     Problem List Patient Active Problem List   Diagnosis Date Noted   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/2020   Permanent atrial fibrillation (HCC) 10/22/2018   Non-rheumatic mitral regurgitation 10/22/2018   Obesity (BMI 30-39.9) 10/22/2018    Waynette Buttery 12/11/2020, 12:29 PM  [facility] [address] [place], [place], [address] Phone: [phone]   Fax:  [phone]  Name: Robert [name] MRN: [id number] Date of Birth: [birth year] Alvira Monday, PT 12/11/20 12:30 PM

## 2020-12-13 NOTE — Progress Notes (Signed)
Robert [name] presents today for 2 week follow-up after completing radiation to his mandible/lower gum on 11/29/2020  Pain issues, if any: Patient denies Using a feeding tube?: N/A Weight changes, if any:  Wt Readings from Last 3 Encounters:  12/14/20 215 lb 8 oz (97.8 kg)  11/21/20 223 lb 12.8 oz (101.5 kg)  11/05/20 223 lb 9.6 oz (101.4 kg)   Swallowing issues, if any: Very minor--mainly related to difficulty fully closing his mouth. Otherwise he reports he can eat/drink a wide variety. 11/22/2020 Last worked with [name]: "He reports his labial seal when drinking is still worse with large cup than with a smaller cup so he has maintained liquids with smaller cup sizes. He uses a lingual lift on cup seal to simulate a lower labial seal on cup to mitigate labial leakage. He has completed all exercises mostly at recommeded frequencies. He was unable to produce ocugh with supersupraglottic so SLP modifed to strong throat clear which pt was able ot produce. SLP had to tell pt rationale for HEP. He was concerned aobut the hardened nature of his tissue in his neck and the swelling persistent with tissue around his mandible. He also mentioned disappointmetn at being unable to have labial seal. SLP referred him to his surgeon for questions about these areas of concern. SLP added pucker/retract lips exercise and pt thanked SLP." Smoking or chewing tobacco? None Using fluoride trays daily? N/A--does reports occasional ulcers to his gums and inner side of his cheeks (states they tend to occur after he eats something more irritating) Last ENT visit was on: Not since diagnosis  Other notable issues, if any: Working with PT for lymphedema management (last session 12/11/20). Denies any ear or jaw pain. Right side of his cheek still feels tight/numb,  but he is managing. Has become more active (doing yard work and house chores). Reports F/U with surgeon to look at wound on right arm (reports area cotninues to  heal well). Fatigue has improved and overall he reports he feels well

## 2020-12-14 ENCOUNTER — Other Ambulatory Visit: Payer: Self-pay

## 2020-12-14 ENCOUNTER — Encounter: Payer: Self-pay | Admitting: Radiation Oncology

## 2020-12-14 ENCOUNTER — Ambulatory Visit
Admission: RE | Admit: 2020-12-14 | Discharge: 2020-12-14 | Disposition: A | Discharge status: Home / Self Care | Payer: Medicare Other | Source: Ambulatory Visit | Attending: Radiation Oncology | Admitting: Radiation Oncology

## 2020-12-14 VITALS — BP 112/69 | HR 45 | Resp 18 | Ht 72.0 in | Wt 215.5 lb

## 2020-12-14 DIAGNOSIS — Z923 Personal history of irradiation: Secondary | ICD-10-CM | POA: Diagnosis not present

## 2020-12-14 DIAGNOSIS — Z7901 Long term (current) use of anticoagulants: Secondary | ICD-10-CM | POA: Insufficient documentation

## 2020-12-14 DIAGNOSIS — Z79899 Other long term (current) drug therapy: Secondary | ICD-10-CM | POA: Insufficient documentation

## 2020-12-14 DIAGNOSIS — C031 Malignant neoplasm of lower gum: Secondary | ICD-10-CM

## 2020-12-14 DIAGNOSIS — R5383 Other fatigue: Secondary | ICD-10-CM | POA: Diagnosis not present

## 2020-12-14 NOTE — Progress Notes (Signed)
Radiation Oncology         [phone] ________________________________  Name: Robert [name] MRN: [id number]  Date: 12/14/2020  DOB: 1941-02-02  Follow-Up Visit Note  CC: Robert Has, MD  Robert Skains, MD  Diagnosis and Prior Radiotherapy:       ICD-10-CM   1. Cancer of lower gum (HCC)  C03.1     Cancer Staging Cancer of lower gum (HCC) Staging form: Oral Cavity, AJCC 8th Edition - Pathologic stage from 10/05/2020: Stage IVA (pT4a, pN2a, cM0) - Signed by Lonie Peak, MD on 10/05/2020 Stage prefix: Initial diagnosis Histologic grade (G): G2 Histologic grading system: 3 grade system Presence of extranodal extension: Present Perineural invasion (PNI): Present   CHIEF COMPLAINT:  Here for follow-up and surveillance of oral cancer  Narrative:  The patient returns today for routine follow-up.  Robert [name] presents today for 2 week follow-up after completing radiation to his mandible/lower gum on 11/29/2020  Pain issues, if any: Patient denies Using a feeding tube?: N/A Weight changes, if any:  Wt Readings from Last 3 Encounters:  12/14/20 215 lb 8 oz (97.8 kg)  11/21/20 223 lb 12.8 oz (101.5 kg)  11/05/20 223 lb 9.6 oz (101.4 kg)   Swallowing issues, if any: Very minor--mainly related to difficulty fully closing his mouth. Otherwise he reports he can eat/drink a wide variety. 11/22/2020 Last worked with Robert [name]: "He reports his labial seal when drinking is still worse with large cup than with a smaller cup so he [name] maintained liquids with smaller cup sizes. He uses a lingual lift on cup seal to simulate a lower labial seal on cup to mitigate labial leakage. He [name] completed all exercises mostly at recommeded frequencies. He was unable to produce ocugh with supersupraglottic so SLP modifed to strong throat clear which pt was able ot produce. SLP had to tell pt rationale for HEP. He was concerned aobut the hardened nature of his tissue in his neck and the swelling  persistent with tissue around his mandible. He also mentioned disappointmetn at being unable to have labial seal. SLP referred him to his surgeon for questions about these areas of concern. SLP added pucker/retract lips exercise and pt thanked SLP." Smoking or chewing tobacco? None Using fluoride trays daily? N/A--does reports occasional ulcers to his gums and inner side of his cheeks (states they tend to occur after he eats something more irritating) Last ENT visit was on: Not since diagnosis  Other notable issues, if any: Working with PT for lymphedema management (last session 12/11/20). Denies any ear or jaw pain. Right side of his cheek still feels tight/numb,  but he is managing. [name] become more active (doing yard work and house chores). Reports F/U with surgeon to look at wound on right arm (reports area cotninues to heal well). Fatigue [name] improved and overall he reports he feels well                   ALLERGIES:  [name] No Known Allergies.  Meds: Current Outpatient Medications  Medication Sig Dispense Refill   metoprolol tartrate (LOPRESSOR) 100 MG tablet Take 100 mg by mouth 2 (two) times daily.     rivaroxaban (XARELTO) 20 MG TABS tablet Take 1 tablet (20 mg total) by mouth daily with supper. 90 tablet 3   No current facility-administered medications for this encounter.    Physical Findings: The patient is in no acute distress. Patient is alert and oriented. Wt Readings from Last 3 Encounters:  12/14/20 215  lb 8 oz (97.8 kg)  11/21/20 223 lb 12.8 oz (101.5 kg)  11/05/20 223 lb 9.6 oz (101.4 kg)    height is 6' (1.829 m) and weight is 215 lb 8 oz (97.8 kg). His blood pressure is 112/69 and his pulse is 45 (abnormal). His respiration is 18 and oxygen saturation is 98%. .  General: Alert and oriented, in no acute distress HEENT: Head is normocephalic. Extraocular movements are intact. Oropharynx is notable for no lesions in upper throat.  No evidence of tumor in the mouth to palpation  or visual inspection.  Postoperative changes noted.  No thrush or residual mucositis. Neck: Neck is notable for no palpable adenopathy.  Significant lymphedema in the lower face and upper neck present Skin: Skin in treatment fields shows satisfactory healing with some residual dryness and erythema Psychiatric: Judgment and insight are intact. Affect is appropriate.   Lab Findings: No results found for: WBC, HGB, HCT, MCV, PLT  No results found for: TSH  Radiographic Findings: No results found.  Impression/Plan:    1) Head and Neck Cancer Status: Healing well from postoperative radiation  2) Nutritional Status: Relatively stable weight, he is very diligent about oral intake PEG tube: None  3) Risk Factors: The patient [name] been educated about risk factors including alcohol and tobacco abuse; they understand that avoidance of alcohol and tobacco is important to prevent recurrences as well as other cancers  4) Swallowing: Good function, continue speech-language pathology exercises  5) Dental: Encouraged to continue regular followup with dentistry, and dental hygiene including fluoride rinses.  We will refer him back to Dr. Alda Berthold for posttreatment checkup  6) Thyroid function: Check annually  7) Other: He follows up with ENT next week.     8) Follow-up in mid November with restaging CT of neck and chest with contrast.  The patient was encouraged to call with any issues or questions before then.  9) Letter written to excuse him from jury duty as he was recently summoned.  On date of service, in total, I spent  25  minutes on this encounter. Patient was seen in person. _____________________________________   Lonie Peak, MD

## 2020-12-14 NOTE — Progress Notes (Signed)
Oncology Nurse Navigator Documentation   I met with Robert [name] during his post-treatment follow up with Dr. Isidore Moos today. He is very active at home and eating most all foods but at a slower pace than before surgery and radiation. He was provided with a letter excusing him from his upcoming jury summons and a follow up with Dr. Isidore Moos in November with planned CT scans before the follow up. He knows to call me if he has any further needs or questions.   Robert Asa RN, BSN, OCN Head & Neck Oncology Nurse [place] [facility] Phone # [phone]  Fax # [phone]

## 2020-12-18 DIAGNOSIS — C048 Malignant neoplasm of overlapping sites of floor of mouth: Secondary | ICD-10-CM | POA: Diagnosis not present

## 2020-12-18 DIAGNOSIS — R1312 Dysphagia, oropharyngeal phase: Secondary | ICD-10-CM | POA: Diagnosis not present

## 2020-12-19 ENCOUNTER — Encounter: Payer: Self-pay | Admitting: Physical Therapy

## 2020-12-19 ENCOUNTER — Ambulatory Visit: Payer: Medicare Other | Admitting: Physical Therapy

## 2020-12-19 ENCOUNTER — Other Ambulatory Visit: Payer: Self-pay

## 2020-12-19 DIAGNOSIS — R293 Abnormal posture: Secondary | ICD-10-CM | POA: Diagnosis not present

## 2020-12-19 DIAGNOSIS — C031 Malignant neoplasm of lower gum: Secondary | ICD-10-CM

## 2020-12-19 DIAGNOSIS — L599 Disorder of the skin and subcutaneous tissue related to radiation, unspecified: Secondary | ICD-10-CM

## 2020-12-19 DIAGNOSIS — Z483 Aftercare following surgery for neoplasm: Secondary | ICD-10-CM | POA: Diagnosis not present

## 2020-12-19 DIAGNOSIS — Z510 Encounter for antineoplastic radiation therapy: Secondary | ICD-10-CM | POA: Diagnosis not present

## 2020-12-19 DIAGNOSIS — I890 Lymphedema, not elsewhere classified: Secondary | ICD-10-CM | POA: Diagnosis not present

## 2020-12-19 NOTE — Therapy (Signed)
[place], [place], [address] Phone: [phone]   Fax:  [phone]  Physical Therapy Treatment  Patient Details  Name: Robert [name] MRN: [id number] Date of Birth: 1940/12/05 Referring Provider (PT): Reita May Date: 12/19/2020   PT End of Session - 12/19/20 1508     Visit Number 10    Number of Visits 14    Date for PT Re-Evaluation 01/08/21    PT Start Time 9024    PT Stop Time 1505    PT Time Calculation (min) 60 min    Activity Tolerance Patient tolerated treatment well    Behavior During Therapy [facility] for tasks assessed/performed             Past Medical History:  Diagnosis Date   A-fib ([place])    HTN (hypertension)     Past Surgical History:  Procedure Laterality Date   DENTAL SURGERY      There were no vitals filed for this visit.   Subjective Assessment - 12/19/20 1405     Subjective I am getting measured for the garment on Friday.    Pertinent History Cancer of lower gum, stage IVA (T4a, N2a, M0), presented with left buccal squamous cell carcinoma in 2019. A biopsy and reconstruction were completed in October 2019, 08/08/20 Robert [name] followed up with Robert [name] and mentioned a mass that he had noticed to his right anterior mandible in Jan 2022. Robert [name] obtained a sample for cytology which revealed highly atypical squamous cells suspicious for [facility], 08/15/20 CT neck revealed revealed a large mass arising from the buccal mucosal surface of the right lower lip, compatible with recurrent squamous cell carcinoma. Chest CT taken on the same day showed no evidence of metastatic disease within the chest, 09/03/20 Robert [name] completed a through and through lip resection with marginal mandibulectomy, bilateral neck dissection and flap reconstruction, will receive 33 fractions of radiation to his right lower gum area and a lower dose to his bilateral neck. Chemotherapy may be started  at a later date. He started on 10/15/20 and will complete 11/29/20.    Patient Stated Goals to gain info from providers    Currently in Pain? No/denies    Pain Score 0-No pain                               [facility] Adult PT Treatment/Exercise - 12/19/20 0001       Manual Therapy   Manual Lymphatic Drainage (MLD) short neck, SCF,  bil axillary nodes, bilateral chest, posterior,medial anterior neck into the river,anterior throat , submandibular area, chin especially over area of most prominent Rt sided swelling (included scar massage here as well during), bil masseters, and then retraced all steps.    Passive ROM manual traction, suboccipital release, PROM bilateral Rot                         PT Long Term Goals - 12/11/20 1222       PT LONG TERM GOAL #1   Title Pt will report a 50% reduction in swelling of chin and anterior neck to decrease risk of infeciton.    Time 6    Period Weeks    Status On-going    Target Date 01/08/21      PT LONG TERM GOAL #2   Title Pt will be independent in self MLD for  long term management of edema.    Baseline doing but needs review    Time 4    Period Weeks    Status On-going    Target Date 01/08/21      PT LONG TERM GOAL #3   Title Pt will obtain appropriate compression garments for long term management of edema.    Time 4    Period Weeks    Status On-going    Target Date 01/08/21                   Plan - 12/19/20 1556     Clinical Impression Statement Continued to work on decreasing anterior neck and chin edema through manual lymphatic drainage. Fibrosis at anterior neck improved greatly with massage today. He still presented with fullness at chin but this does seem to be decreasing some. Pt will be measured for a compression garment on Friday. Worked some on PROM to neck in to bilateral rotation to help reduce tightness.    PT Frequency 2x / week    PT Duration 6 weeks    PT  Treatment/Interventions ADLs/Self Care Home Management;Therapeutic exercise;Patient/family education;Manual techniques;Manual lymph drainage;Compression bandaging;Scar mobilization;Passive range of motion;Taping;Vasopneumatic Device    PT Next Visit Plan Cont and review MLD to neck, assess chip pack, cont cervical PROM, scar mobilization pending how skin is doing from radiation; review postural band exs    PT Home Exercise Plan head and neck ROM exercises; wear chip pack and perform self MLD both daily    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 classified  Disorder of the skin and subcutaneous tissue related to radiation, unspecified  Aftercare following surgery for neoplasm  Abnormal posture  Malignant neoplasm of lower gum ([place])     Problem List Patient Active Problem List   Diagnosis Date Noted   Cancer of lower gum ([place]) 10/05/2020   Permanent atrial fibrillation (Robert [name]) 10/22/2018   Non-rheumatic mitral regurgitation 10/22/2018   Obesity (BMI 30-39.9) 10/22/2018    [name] 12/19/2020, 3:58 PM  [place], [place], [address] Phone: [phone]   Fax:  [phone]  Name: Robert [name] MRN: [id number] Date of Birth: 08/27/1940  Manus Gunning, PT 12/19/20 3:58 PM

## 2020-12-21 ENCOUNTER — Ambulatory Visit: Payer: Medicare Other | Attending: Radiation Oncology | Admitting: Rehabilitation

## 2020-12-21 ENCOUNTER — Other Ambulatory Visit: Payer: Self-pay

## 2020-12-21 ENCOUNTER — Encounter: Payer: Self-pay | Admitting: Rehabilitation

## 2020-12-21 DIAGNOSIS — R471 Dysarthria and anarthria: Secondary | ICD-10-CM | POA: Insufficient documentation

## 2020-12-21 DIAGNOSIS — R1312 Dysphagia, oropharyngeal phase: Secondary | ICD-10-CM | POA: Insufficient documentation

## 2020-12-21 DIAGNOSIS — C031 Malignant neoplasm of lower gum: Secondary | ICD-10-CM | POA: Diagnosis not present

## 2020-12-21 DIAGNOSIS — I890 Lymphedema, not elsewhere classified: Secondary | ICD-10-CM | POA: Diagnosis not present

## 2020-12-21 DIAGNOSIS — R293 Abnormal posture: Secondary | ICD-10-CM | POA: Diagnosis not present

## 2020-12-21 DIAGNOSIS — Z483 Aftercare following surgery for neoplasm: Secondary | ICD-10-CM | POA: Diagnosis not present

## 2020-12-21 DIAGNOSIS — L599 Disorder of the skin and subcutaneous tissue related to radiation, unspecified: Secondary | ICD-10-CM | POA: Diagnosis not present

## 2020-12-21 NOTE — Therapy (Signed)
GOAL #3   Title Pt will obtain appropriate compression garments for long term management of edema.    Time 4    Period Weeks    Status On-going    Target Date 01/08/21                   Plan - 12/21/20 0943     Clinical Impression Statement Measured for compression garment today    PT Frequency 2x / week    PT Duration 6 weeks    PT Treatment/Interventions ADLs/Self Care Home Management;Therapeutic exercise;Patient/family education;Manual techniques;Manual lymph drainage;Compression bandaging;Scar mobilization;Passive range of motion;Taping;Vasopneumatic Device    PT Next Visit Plan Cont and review MLD to neck, cont cervical PROM, scar mobilization pending how skin is doing from radiation; review postural band exs    Consulted and Agree with Plan of Care Patient             Patient will benefit from skilled therapeutic intervention in order to improve the following deficits and impairments:  Postural dysfunction, Increased  fascial restricitons, Decreased range of motion, Decreased scar mobility, Increased edema  Visit Diagnosis: Lymphedema, not elsewhere classified  Disorder of the skin and subcutaneous tissue related to radiation, unspecified  Aftercare following surgery for neoplasm  Abnormal posture  Malignant neoplasm of lower gum ([place])     Problem List Patient Active Problem List   Diagnosis Date Noted   Cancer of lower gum ([place]) 10/05/2020   Permanent atrial fibrillation ([place]) 10/22/2018   Non-rheumatic mitral regurgitation 10/22/2018   Obesity (BMI 30-39.9) 10/22/2018    [name] 12/21/2020, 9:44 AM  [place], [place], [address] Phone: [phone]   Fax:  [phone]  Name: Robert [name] MRN: [id number] Date of Birth: Nov 22, 1940  [place], [place], [address] Phone: [phone]   Fax:  [phone]  Physical Therapy Treatment  Patient Details  Name: Robert [name] MRN: [id number] Date of Birth: 1941-03-24 Referring Provider (PT): Reita May Date: 12/21/2020   PT End of Session - 12/21/20 0942     Visit Number 11    Number of Visits 14    PT Start Time 0900    PT Stop Time 0915    PT Time Calculation (min) 15 min    Activity Tolerance Patient tolerated treatment well    Behavior During Therapy [facility] for tasks assessed/performed             Past Medical History:  Diagnosis Date   A-fib ([place])    HTN (hypertension)     Past Surgical History:  Procedure Laterality Date   DENTAL SURGERY      There were no vitals filed for this visit.   Subjective Assessment - 12/21/20 0901     Subjective getting measured    Pertinent History Cancer of lower gum, stage IVA (T4a, N2a, M0), presented with left buccal squamous cell carcinoma in 2019. A biopsy and reconstruction were completed in October 2019, 08/08/20 Mr. Robert [name] followed up with Dr. Vicie [name] and mentioned a mass that he had noticed to his right anterior mandible in Jan 2022. Dr. Vicie [name] obtained a sample for cytology which revealed highly atypical squamous cells suspicious for [facility], 08/15/20 CT neck revealed revealed a large mass arising from the buccal mucosal surface of the right lower lip, compatible with recurrent squamous cell carcinoma. Chest CT taken on the same day showed no evidence of metastatic disease within the chest, 09/03/20 Dr. Nicolette [name] completed a through and through lip resection with marginal mandibulectomy, bilateral neck dissection and flap reconstruction, will receive 33 fractions of radiation to his right lower gum area and a lower dose to his bilateral neck. Chemotherapy may be started at a later date. He started on 10/15/20 and will complete 11/29/20.     Currently in Pain? No/denies                               [facility] Adult PT Treatment/Exercise - 12/21/20 0001       Prosthetics   Prosthetic Care Comments  Educated pt on options for long term facial compression garments.  Pt not wanting to spend much on garments so he will most likely stay with his homemade chip pack for now and consider ordering the [employer]is chin strap/neck OTS garment online in size XL when neeeded                         PT Long Term Goals - 12/11/20 1222       PT LONG TERM GOAL #1   Title Pt will report a 50% reduction in swelling of chin and anterior neck to decrease risk of infeciton.    Time 6    Period Weeks    Status On-going    Target Date 01/08/21      PT LONG TERM GOAL #2   Title Pt will be independent in self MLD for long term management of edema.    Baseline doing but needs review    Time 4    Period Weeks    Status On-going    Target Date 01/08/21      PT LONG TERM

## 2020-12-21 NOTE — Progress Notes (Signed)
Patient Name: Robert [name] MRN: [id number] DOB: July 08, 1940 Referring Physician: Corey Skains (Profile Not Attached) Date of Service: 11/29/2020 [facility],                                                         End Of Treatment Note  Diagnoses: C03.1-Malignant neoplasm of lower gum  Cancer Staging: Cancer Staging Cancer of lower gum (HCC) Staging form: Oral Cavity, AJCC 8th Edition - Pathologic stage from 10/05/2020: Stage IVA (pT4a, pN2a, cM0) - Signed by Lonie Peak, MD on 10/05/2020 Stage prefix: Initial diagnosis Histologic grade (G): G2 Histologic grading system: 3 grade system Presence of extranodal extension: Present Perineural invasion (PNI): Present  Intent: Curative  Radiation Treatment Dates: 10/15/2020 through 11/29/2020 Site Technique Total Dose (Gy) Dose per Fx (Gy) Completed Fx Beam Energies  Mandible: HN_low_gum IMRT 66/66 2 33/33 6X   Narrative: The patient tolerated radiation therapy relatively well.   Plan: The patient will follow-up with radiation oncology in 2-3wks.  -----------------------------------  Lonie Peak, MD

## 2020-12-26 ENCOUNTER — Ambulatory Visit: Payer: Medicare Other | Admitting: Rehabilitation

## 2020-12-26 ENCOUNTER — Other Ambulatory Visit: Payer: Self-pay

## 2020-12-26 ENCOUNTER — Ambulatory Visit: Payer: Medicare Other

## 2020-12-26 DIAGNOSIS — Z483 Aftercare following surgery for neoplasm: Secondary | ICD-10-CM

## 2020-12-26 DIAGNOSIS — C031 Malignant neoplasm of lower gum: Secondary | ICD-10-CM | POA: Diagnosis not present

## 2020-12-26 DIAGNOSIS — I890 Lymphedema, not elsewhere classified: Secondary | ICD-10-CM

## 2020-12-26 DIAGNOSIS — L599 Disorder of the skin and subcutaneous tissue related to radiation, unspecified: Secondary | ICD-10-CM | POA: Diagnosis not present

## 2020-12-26 DIAGNOSIS — R293 Abnormal posture: Secondary | ICD-10-CM | POA: Diagnosis not present

## 2020-12-26 DIAGNOSIS — R1312 Dysphagia, oropharyngeal phase: Secondary | ICD-10-CM | POA: Diagnosis not present

## 2020-12-26 NOTE — Therapy (Signed)
[facility] [address] [place], [place], [address] Phone: [phone]   Fax:  [phone]  Physical Therapy Treatment  Patient Details  Name: Robert [name] MRN: [id number] Date of Birth: 19-Mar-1941 Referring Provider (PT): Vertis Kelch Date: 12/26/2020   PT End of Session - 12/26/20 1007     Visit Number 12    Number of Visits 14    Date for PT Re-Evaluation 01/08/21    PT Start Time 0911    PT Stop Time 1005    PT Time Calculation (min) 54 min    Activity Tolerance Patient tolerated treatment well    Behavior During Therapy [facility] for tasks assessed/performed             Past Medical History:  Diagnosis Date   A-fib (HCC)    HTN (hypertension)     Past Surgical History:  Procedure Laterality Date   DENTAL SURGERY      There were no vitals filed for this visit.   Subjective Assessment - 12/26/20 0920     Subjective My neck doesn't feel as tight as it used to, especially by the end of the day. I also really like the "waffle" foam Rosey Bath gave me (small peach medi foam) at my chin.    Pertinent History Cancer of lower gum, stage IVA (T4a, N2a, M0), presented with left buccal squamous cell carcinoma in 2019. A biopsy and reconstruction were completed in October 2019, 08/08/20 [name] followed up with Dr. Erroll Luna and mentioned a mass that he had noticed to his right anterior mandible in Jan 2022. Dr. Erroll Luna obtained a sample for cytology which revealed highly atypical squamous cells suspicious for [facility], 08/15/20 CT neck revealed revealed a large mass arising from the buccal mucosal surface of the right lower lip, compatible with recurrent squamous cell carcinoma. Chest CT taken on the same day showed no evidence of metastatic disease within the chest, 09/03/20 Dr. Hezzie Bump completed a through and through lip resection with marginal mandibulectomy, bilateral neck dissection and flap reconstruction, will receive 33  fractions of radiation to his right lower gum area and a lower dose to his bilateral neck. Chemotherapy may be started at a later date. He started on 10/15/20 and will complete 11/29/20.    Patient Stated Goals to gain info from providers    Currently in Pain? No/denies                               [facility] Adult PT Treatment/Exercise - 12/26/20 0001       Self-Care   Self-Care Other Self-Care Comments    Other Self-Care Comments  Discussed other compression options as pt reports not wanting to pay so much for a garment. So showed him the [facility] head/neck garment that covres chin and he was interested in purchasing this and was instructed to bring this in when he gets it and we will tailor fit foam for the garment and his compression needs.      Manual Therapy   Manual Lymphatic Drainage (MLD) long neck, SCF, 5 diaphragmatic breaths, bil shoulder collectors and bil axillary nodes, posterior,medial anterior neck into the river,anterior throat , submandibular area, chin especially over area of most prominent Rt sided swelling (included scar massage here as well during), bil masseters, and then retraced all steps.    Passive ROM manual traction, suboccipital release, bil cervical rotation and side bending  Upper Extremity Functional Index Score :   /80        PT Long Term Goals - 12/11/20 1222       PT LONG TERM GOAL #1   Title Pt will report a 50% reduction in swelling of chin and anterior neck to decrease risk of infeciton.    Time 6    Period Weeks    Status On-going    Target Date 01/08/21      PT LONG TERM GOAL #2   Title Pt will be independent in self MLD for long term management of edema.    Baseline doing but needs review    Time 4    Period Weeks    Status On-going    Target Date 01/08/21      PT LONG TERM GOAL #3   Title Pt will obtain appropriate compression garments for long term management of edema.    Time 4    Period  Weeks    Status On-going    Target Date 01/08/21                   Plan - 12/26/20 1007     Clinical Impression Statement Continued with MLD and cervical P/ROM of head/neck area per flowsheet. Further discucssed his compression options and he is interested in purchasing a [employer] head/neck garment that covers chin. He is to bring this when he gets it in so we can make this more form fitting with compression foam and he verbalized understanding.    Stability/Clinical Decision Making Stable/Uncomplicated    Rehab Potential Good    PT Frequency 2x / week    PT Duration 6 weeks    PT Treatment/Interventions ADLs/Self Care Home Management;Therapeutic exercise;Patient/family education;Manual techniques;Manual lymph drainage;Compression bandaging;Scar mobilization;Passive range of motion;Taping;Vasopneumatic Device    PT Next Visit Plan If he brings in [place] garment add foam; Cont and review MLD to neck, cont cervical PROM, scar mobilization pending how skin is doing from radiation; review postural band exs    PT Home Exercise Plan head and neck ROM exercises; wear chip pack and perform self MLD both daily    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 classified  Disorder of the skin and subcutaneous tissue related to radiation, unspecified  Aftercare following surgery for neoplasm  Abnormal posture  Malignant neoplasm of lower gum (HCC)     Problem List Patient Active Problem List   Diagnosis Date Noted   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/2020   Permanent atrial fibrillation (HCC) 10/22/2018   Non-rheumatic mitral regurgitation 10/22/2018   Obesity (BMI 30-39.9) 10/22/2018     Hermenia Bers, PTA, 12/26/2020, 10:11 AM  [facility] [address] [place], [place], [address] Phone: [phone]   Fax:  [phone]  Name: Shellie Berenguer MRN: [id number] Date of Birth: [birth year]

## 2020-12-27 DIAGNOSIS — R790 Abnormal level of blood mineral: Secondary | ICD-10-CM | POA: Diagnosis not present

## 2020-12-27 DIAGNOSIS — C4492 Squamous cell carcinoma of skin, unspecified: Secondary | ICD-10-CM | POA: Diagnosis not present

## 2020-12-27 DIAGNOSIS — I482 Chronic atrial fibrillation, unspecified: Secondary | ICD-10-CM | POA: Diagnosis not present

## 2020-12-27 DIAGNOSIS — R030 Elevated blood-pressure reading, without diagnosis of hypertension: Secondary | ICD-10-CM | POA: Diagnosis not present

## 2020-12-27 DIAGNOSIS — Z0000 Encounter for general adult medical examination without abnormal findings: Secondary | ICD-10-CM | POA: Diagnosis not present

## 2020-12-27 DIAGNOSIS — Z136 Encounter for screening for cardiovascular disorders: Secondary | ICD-10-CM | POA: Diagnosis not present

## 2020-12-28 ENCOUNTER — Other Ambulatory Visit: Payer: Self-pay

## 2020-12-28 ENCOUNTER — Ambulatory Visit: Payer: Medicare Other

## 2020-12-28 DIAGNOSIS — I890 Lymphedema, not elsewhere classified: Secondary | ICD-10-CM | POA: Diagnosis not present

## 2020-12-28 DIAGNOSIS — R293 Abnormal posture: Secondary | ICD-10-CM

## 2020-12-28 DIAGNOSIS — Z483 Aftercare following surgery for neoplasm: Secondary | ICD-10-CM

## 2020-12-28 DIAGNOSIS — C031 Malignant neoplasm of lower gum: Secondary | ICD-10-CM

## 2020-12-28 DIAGNOSIS — L599 Disorder of the skin and subcutaneous tissue related to radiation, unspecified: Secondary | ICD-10-CM | POA: Diagnosis not present

## 2020-12-28 DIAGNOSIS — R1312 Dysphagia, oropharyngeal phase: Secondary | ICD-10-CM | POA: Diagnosis not present

## 2020-12-28 NOTE — Therapy (Signed)
[facility] [address] [place], [place], [address] Phone: [phone]   Fax:  [phone]  Physical Therapy Treatment  Patient Details  Name: Robert [name] MRN: [id number] Date of Birth: 01/13/1941 Referring Provider (PT): Vertis Kelch Date: 12/28/2020   PT End of Session - 12/28/20 0901     Visit Number 13    Number of Visits 14    Date for PT Re-Evaluation 01/08/21    PT Start Time 0805    PT Stop Time 0902    PT Time Calculation (min) 57 min    Activity Tolerance Patient tolerated treatment well    Behavior During Therapy [facility] for tasks assessed/performed             Past Medical History:  Diagnosis Date   A-fib (HCC)    HTN (hypertension)     Past Surgical History:  Procedure Laterality Date   DENTAL SURGERY      There were no vitals filed for this visit.   Subjective Assessment - 12/28/20 0808     Subjective The Rt side of my neck is actually getting alot softer but for some reason the Lt side feels a little tighter this morning.    Pertinent History Cancer of lower gum, stage IVA (T4a, N2a, M0), presented with left buccal squamous cell carcinoma in 2019. A biopsy and reconstruction were completed in October 2019, 08/08/20 [name] followed up with Dr. Erroll Luna and mentioned a mass that he had noticed to his right anterior mandible in Jan 2022. Dr. Erroll Luna obtained a sample for cytology which revealed highly atypical squamous cells suspicious for [facility], 08/15/20 CT neck revealed revealed a large mass arising from the buccal mucosal surface of the right lower lip, compatible with recurrent squamous cell carcinoma. Chest CT taken on the same day showed no evidence of metastatic disease within the chest, 09/03/20 Dr. Hezzie Bump completed a through and through lip resection with marginal mandibulectomy, bilateral neck dissection and flap reconstruction, will receive 33 fractions of radiation to his right lower gum  area and a lower dose to his bilateral neck. Chemotherapy may be started at a later date. He started on 10/15/20 and will complete 11/29/20.    Patient Stated Goals to gain info from providers    Currently in Pain? No/denies                               [facility] Adult PT Treatment/Exercise - 12/28/20 0001       Manual Therapy   Manual Lymphatic Drainage (MLD) long neck, SCF, 5 diaphragmatic breaths, bil shoulder collectors and bil axillary nodes, posterior,medial anterior neck into the river,anterior throat , submandibular area, chin especially over area of most prominent Rt sided swelling (included scar massage here as well during), bil masseters, and then retraced all steps.    Passive ROM manual traction, suboccipital release, bil cervical rotation and side bending                          PT Long Term Goals - 12/11/20 1222       PT LONG TERM GOAL #1   Title Pt will report a 50% reduction in swelling of chin and anterior neck to decrease risk of infeciton.    Time 6    Period Weeks    Status On-going    Target Date 01/08/21  PT LONG TERM GOAL #2   Title Pt will be independent in self MLD for long term management of edema.    Baseline doing but needs review    Time 4    Period Weeks    Status On-going    Target Date 01/08/21      PT LONG TERM GOAL #3   Title Pt will obtain appropriate compression garments for long term management of edema.    Time 4    Period Weeks    Status On-going    Target Date 01/08/21                   Plan - 12/28/20 0902     Clinical Impression Statement Continued with MLD and cervical P/ROM, see flowsheet. Pt has yet to order the [facility] compression garment but plans to do so and will bring this in for [place] to make this more form fitting with compression foam. He would like to cont physical therapy after next week so will need renewal.    Stability/Clinical Decision Making Stable/Uncomplicated    Rehab  Potential Good    PT Frequency 2x / week    PT Duration 6 weeks    PT Treatment/Interventions ADLs/Self Care Home Management;Therapeutic exercise;Patient/family education;Manual techniques;Manual lymph drainage;Compression bandaging;Scar mobilization;Passive range of motion;Taping;Vasopneumatic Device    PT Next Visit Plan Renewal needed next week. If he brings in [place] garment add foam; Cont and review MLD to neck, cont cervical PROM, scar mobilization pending how skin is doing from radiation; review postural band exs    PT Home Exercise Plan head and neck ROM exercises; wear chip pack and perform self MLD both daily    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 classified  Disorder of the skin and subcutaneous tissue related to radiation, unspecified  Aftercare following surgery for neoplasm  Abnormal posture  Malignant neoplasm of lower gum (HCC)     Problem List Patient Active Problem List   Diagnosis Date Noted   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/2020   Permanent atrial fibrillation (HCC) 10/22/2018   Non-rheumatic mitral regurgitation 10/22/2018   Obesity (BMI 30-39.9) 10/22/2018    Hermenia Bers, PTA 12/28/2020, 9:06 AM  [facility] [address] [place], [place], [address] Phone: [phone]   Fax:  [phone]  Name: Robert [name] MRN: [id number] Date of Birth: 07-07-1940

## 2020-12-31 ENCOUNTER — Encounter: Payer: Medicare Other | Admitting: Nutrition

## 2020-12-31 ENCOUNTER — Telehealth: Payer: Self-pay | Admitting: Nutrition

## 2020-12-31 DIAGNOSIS — L820 Inflamed seborrheic keratosis: Secondary | ICD-10-CM | POA: Diagnosis not present

## 2020-12-31 DIAGNOSIS — Z789 Other specified health status: Secondary | ICD-10-CM | POA: Diagnosis not present

## 2020-12-31 DIAGNOSIS — L538 Other specified erythematous conditions: Secondary | ICD-10-CM | POA: Diagnosis not present

## 2020-12-31 DIAGNOSIS — L298 Other pruritus: Secondary | ICD-10-CM | POA: Diagnosis not present

## 2020-12-31 DIAGNOSIS — L570 Actinic keratosis: Secondary | ICD-10-CM | POA: Diagnosis not present

## 2020-12-31 NOTE — Telephone Encounter (Signed)
Nutrition follow-up completed with patient over the phone status post radiation therapy for cancer of the lower gum. Patient reports he just went to his primary doctor last week and his weight is stable. States he has not gained any weight but he has not lost weight either. Last weight recorded was 215 pounds August 26. He reports everything is going well and he is eating a variety of foods daily.  He continues to drink 1-2 cartons of Ensure every day. He has good swallowing function and continues to work with speech and physical therapy.  Nutrition diagnosis: Unintended weight loss is stable.  Intervention: Reviewed strategies for adequate calorie and protein intake. Continue oral nutrition supplements for weight maintenance. Recommended patient contact RD for any changes or questions.  No follow-up scheduled but patient has RD contact information.  **Disclaimer: This note was dictated with voice recognition software. Similar sounding words can inadvertently be transcribed and this note may contain transcription errors which may not have been corrected upon publication of note.**

## 2021-01-01 ENCOUNTER — Ambulatory Visit: Payer: Medicare Other | Admitting: Physical Therapy

## 2021-01-01 ENCOUNTER — Encounter: Payer: Self-pay | Admitting: Physical Therapy

## 2021-01-01 ENCOUNTER — Other Ambulatory Visit: Payer: Self-pay

## 2021-01-01 DIAGNOSIS — R1312 Dysphagia, oropharyngeal phase: Secondary | ICD-10-CM | POA: Diagnosis not present

## 2021-01-01 DIAGNOSIS — Z483 Aftercare following surgery for neoplasm: Secondary | ICD-10-CM | POA: Diagnosis not present

## 2021-01-01 DIAGNOSIS — R293 Abnormal posture: Secondary | ICD-10-CM

## 2021-01-01 DIAGNOSIS — C031 Malignant neoplasm of lower gum: Secondary | ICD-10-CM

## 2021-01-01 DIAGNOSIS — I890 Lymphedema, not elsewhere classified: Secondary | ICD-10-CM

## 2021-01-01 DIAGNOSIS — L599 Disorder of the skin and subcutaneous tissue related to radiation, unspecified: Secondary | ICD-10-CM

## 2021-01-01 NOTE — Therapy (Signed)
[facility] [address] [place], [place], [address] Phone: [phone]   Fax:  [phone]  Physical Therapy Treatment  Patient Details  Name: Robert [name] MRN: [id number] Date of Birth: Oct 28, 1940 Referring Provider (PT): Vertis Kelch Date: 01/01/2021   PT End of Session - 01/01/21 1059     Visit Number 14    Number of Visits 14    Date for PT Re-Evaluation 01/08/21    PT Start Time 1006    PT Stop Time 1058    PT Time Calculation (min) 52 min    Activity Tolerance Patient tolerated treatment well    Behavior During Therapy [facility] for tasks assessed/performed             Past Medical History:  Diagnosis Date   A-fib (HCC)    HTN (hypertension)     Past Surgical History:  Procedure Laterality Date   DENTAL SURGERY      There were no vitals filed for this visit.   Subjective Assessment - 01/01/21 1007     Subjective I have not ordered the [facility]. I was using the chip pack to see if it would do any good. It might but it doesn't seem to make a lot of difference.    Pertinent History Cancer of lower gum, stage IVA (T4a, N2a, M0), presented with left buccal squamous cell carcinoma in 2019. A biopsy and reconstruction were completed in October 2019, 08/08/20 [name] followed up with Dr. Erroll Luna and mentioned a mass that he had noticed to his right anterior mandible in Jan 2022. Dr. Erroll Luna obtained a sample for cytology which revealed highly atypical squamous cells suspicious for [facility], 08/15/20 CT neck revealed revealed a large mass arising from the buccal mucosal surface of the right lower lip, compatible with recurrent squamous cell carcinoma. Chest CT taken on the same day showed no evidence of metastatic disease within the chest, 09/03/20 Dr. Hezzie Bump completed a through and through lip resection with marginal mandibulectomy, bilateral neck dissection and flap reconstruction, will receive 33 fractions of radiation  to his right lower gum area and a lower dose to his bilateral neck. Chemotherapy may be started at a later date. He started on 10/15/20 and will complete 11/29/20.    Patient Stated Goals to gain info from providers    Currently in Pain? No/denies    Pain Score 0-No pain                               [facility] Adult PT Treatment/Exercise - 01/01/21 0001       Manual Therapy   Edema Management educated pt on importance of a head and neck garment    Manual Lymphatic Drainage (MLD) short neck, SCF,  bil axillary nodes, bilateral chest, posterior,medial anterior neck into the river,anterior throat , submandibular area, chin especially over area of most prominent Rt sided swelling (included scar massage here as well during), bil masseters, and then retraced all steps.    Passive ROM manual traction, suboccipital release, bil cervical rotation and side bending                          PT Long Term Goals - 12/11/20 1222       PT LONG TERM GOAL #1   Title Pt will report a 50% reduction in swelling of chin and anterior neck to decrease risk of infeciton.  Time 6    Period Weeks    Status On-going    Target Date 01/08/21      PT LONG TERM GOAL #2   Title Pt will be independent in self MLD for long term management of edema.    Baseline doing but needs review    Time 4    Period Weeks    Status On-going    Target Date 01/08/21      PT LONG TERM GOAL #3   Title Pt will obtain appropriate compression garments for long term management of edema.    Time 4    Period Weeks    Status On-going    Target Date 01/08/21                   Plan - 01/01/21 1102     Clinical Impression Statement Pt presenting with increased fibrosis today in anterior neck. Pt reports he can tell his neck and chin soften with use of the peach foam. Educated pt that it would be helpful for him to purchase the [employer] head and neck garment and wear the peach foam in that for  additional compression and softening of fibrosis. Continued with MLD today to head and neck and PROM to neck in all directions. Pt is very tight in all directions. Pt demonstrated softening of fibrosis by end of session.    PT Frequency 2x / week    PT Duration 6 weeks    PT Treatment/Interventions ADLs/Self Care Home Management;Therapeutic exercise;Patient/family education;Manual techniques;Manual lymph drainage;Compression bandaging;Scar mobilization;Passive range of motion;Taping;Vasopneumatic Device    PT Next Visit Plan Renewal needed next week. If he brings in [place] garment add foam; Cont and review MLD to neck, cont cervical PROM, scar mobilization pending how skin is doing from radiation; review postural band exs    PT Home Exercise Plan head and neck ROM exercises; wear chip pack and perform self MLD both daily    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 classified  Disorder of the skin and subcutaneous tissue related to radiation, unspecified  Aftercare following surgery for neoplasm  Abnormal posture  Malignant neoplasm of lower gum (HCC)     Problem List Patient Active Problem List   Diagnosis Date Noted   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/2020   Permanent atrial fibrillation (HCC) 10/22/2018   Non-rheumatic mitral regurgitation 10/22/2018   Obesity (BMI 30-39.9) 10/22/2018    Leonette Most, PT 01/01/2021, 11:05 AM  [facility] [address] [place], [place], [address] Phone: [phone]   Fax:  [phone]  Name: Alquan Peary MRN: [id number] Date of Birth: [birth year]  Leonette Most,  PT 01/01/21 11:05 AM

## 2021-01-02 ENCOUNTER — Ambulatory Visit: Payer: Medicare Other

## 2021-01-02 DIAGNOSIS — R293 Abnormal posture: Secondary | ICD-10-CM | POA: Diagnosis not present

## 2021-01-02 DIAGNOSIS — C031 Malignant neoplasm of lower gum: Secondary | ICD-10-CM | POA: Diagnosis not present

## 2021-01-02 DIAGNOSIS — Z483 Aftercare following surgery for neoplasm: Secondary | ICD-10-CM | POA: Diagnosis not present

## 2021-01-02 DIAGNOSIS — R1312 Dysphagia, oropharyngeal phase: Secondary | ICD-10-CM

## 2021-01-02 DIAGNOSIS — R471 Dysarthria and anarthria: Secondary | ICD-10-CM

## 2021-01-02 DIAGNOSIS — L599 Disorder of the skin and subcutaneous tissue related to radiation, unspecified: Secondary | ICD-10-CM | POA: Diagnosis not present

## 2021-01-02 DIAGNOSIS — I890 Lymphedema, not elsewhere classified: Secondary | ICD-10-CM | POA: Diagnosis not present

## 2021-01-02 NOTE — Therapy (Signed)
[facility] [address] [place], [place], [address] Phone: [phone]   Fax:  [phone]  Speech Language Pathology Treatment  Patient Details  Name: Robert [name] MRN: [id number] Date of Birth: [birth year] Referring Provider (SLP): Robert [name]   Encounter Date: 01/02/2021   End of Session - 01/02/21 1720     Visit Number 3    Number of Visits 4    Date for SLP Re-Evaluation 01/24/21    SLP Start Time 0848    SLP Stop Time  0923    SLP Time Calculation (min) 35 min    Activity Tolerance Patient tolerated treatment well             Past Medical History:  Diagnosis Date   A-fib (HCC)    HTN (hypertension)     Past Surgical History:  Procedure Laterality Date   DENTAL SURGERY      There were no vitals filed for this visit.   Subjective Assessment - 01/02/21 0917     Subjective "I'm eating like a horse, since I'm getting my taste buds back."  Pt still cannot achieve labial closure.    Currently in Pain? No/denies                   ADULT SLP TREATMENT - 01/02/21 0900       General Information   Behavior/Cognition Alert;Cooperative;Pleasant mood      Treatment Provided   Treatment provided Dysphagia      Dysphagia Treatment   Patient observed directly with PO's Yes    Type of PO's observed Regular;Thin liquids    Liquids provided via Cup    Oral Phase Signs & Symptoms Anterior loss/spillage;Prolonged mastication;Prolonged bolus formation    Pharyngeal Phase Signs & Symptoms --   no overt s/sx noted today   Other treatment/comments SLP suggested pt use straw or place cup on lt side to limit labial leakage. Pt req'd rare min A with his HEP (supersupraglottic). He is eating a wide variety of items. Pt prefers to get implants and asked MD who was unsure at this time for the timing of this. That will assist mastication. Pt complained of thick mucous, and SLP learned pt drinking 3 glasses  of caffeinated ice tea; SLP told pt to consider decaf ice tea and explained rationale.      Assessment / Recommendations / Plan   Plan Continue with current plan of care      Dysphagia Recommendations   Diet recommendations --   as tolerated   Liquids provided via Straw   or cup   Medication Administration --   as tolerated   Compensations Small sips/bites;Slow rate;Check for pocketing;Check for anterior loss      Progression Toward Goals   Progression toward goals Progressing toward goals              SLP Education - 01/02/21 0907     Education Details need to reduce decaf beverages and incr water    Person(s) Educated Patient    Methods Explanation    Comprehension Verbalized understanding              SLP SHORT TERM GOAL #1      Title Pt will complete HEP with rare min A over two sessions    Time 1   01-02-21    Period --   visits, for all STGs    Status Partially met  SLP SHORT TERM GOAL #2    Title Pt will tell SLP rationale for HEP completion over two sessions    Time 1    Status Partially met           SLP SHORT TERM GOAL #3    Title Pt will tell SLP how a food journal can hasten/facilitate return to more normalized diet    Time 1    Status   SLP SHORT TERM GOAL #4 Title   Time Status Deferred due to pt eating    Pt will follow swallow precautions from FEES from June 2022 with POs in 2 therapy sessions  2 visits    01-02-21 ongoing               SLP LONG TERM GOAL #1      Title Pt will complete HEP with modified independence over 3 visits    Time 2 visits    Status ongoing           SLP LONG TERM GOAL #2    Title Pt will tell SLP 3 overt s/s aspiration PNA with modified independence    Time 1 visits    Status ongoing           SLP LONG TERM GOAL #3    Title Pt will tell SLP when to decr frequency of HEP    Time 4 visits    Status ongoing                  Plan - 10/25/20 2317       Clinical Impression Statement At this  time pt swallowing is deemed WNL/WFL with regular/thin with precautions delineated in FEES. See that report for details. SLP reviewed pt's individualized HEP for dysphagia and pt completed each exercise on their own with initial min cues needed for superspraglottic. There are no overt s/s aspiration nor aspiration PNA reported by pt at this time. Data indicate that pt's swallow ability will likely decrease over the course of radiation therapy and could very well decline over time following conclusion of their radiation therapy due to muscle disuse atrophy and/or muscle fibrosis. Pt will cont to need to be seen by SLP in order to assess safety of PO intake, assess the need for recommending any objective swallow assessment, and ensuring pt correctly completes the individualized HEP.    Speech Therapy Frequency --   once approx every 4 weeks    Duration 12 weeks    Treatment/Interventions Aspiration precaution training;Pharyngeal strengthening exercises;Diet toleration management by SLP;Trials of upgraded texture/liquids;Patient/family education;Compensatory strategies;SLP instruction and feedback;Cueing hierarchy    Potential to Achieve Goals Good          Patient will benefit from skilled therapeutic intervention in order to improve the following deficits and impairments:   Dysphagia, oropharyngeal phase  Dysarthria and anarthria    Problem List Patient Active Problem List   Diagnosis Date Noted   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/2020   Permanent atrial fibrillation (HCC) 10/22/2018   Non-rheumatic mitral regurgitation 10/22/2018   Obesity (BMI 30-39.9) 10/22/2018    Robert [name]  ,MS, CCC-SLP  01/02/2021, 5:21 PM   [facility] [address] [place], [place], [address] Phone: [phone]   Fax:  [phone]   Name: Robert [name] MRN: [id number] Date of Birth: February 11, 1941

## 2021-01-02 NOTE — Patient Instructions (Addendum)
   Think DECAF beverages to increase mouth wetness

## 2021-01-03 ENCOUNTER — Ambulatory Visit: Payer: Medicare Other | Admitting: Rehabilitation

## 2021-01-03 ENCOUNTER — Encounter: Payer: Self-pay | Admitting: Rehabilitation

## 2021-01-03 ENCOUNTER — Other Ambulatory Visit: Payer: Self-pay

## 2021-01-03 DIAGNOSIS — R293 Abnormal posture: Secondary | ICD-10-CM | POA: Diagnosis not present

## 2021-01-03 DIAGNOSIS — L599 Disorder of the skin and subcutaneous tissue related to radiation, unspecified: Secondary | ICD-10-CM | POA: Diagnosis not present

## 2021-01-03 DIAGNOSIS — C031 Malignant neoplasm of lower gum: Secondary | ICD-10-CM | POA: Diagnosis not present

## 2021-01-03 DIAGNOSIS — Z483 Aftercare following surgery for neoplasm: Secondary | ICD-10-CM | POA: Diagnosis not present

## 2021-01-03 DIAGNOSIS — I890 Lymphedema, not elsewhere classified: Secondary | ICD-10-CM

## 2021-01-03 DIAGNOSIS — R1312 Dysphagia, oropharyngeal phase: Secondary | ICD-10-CM | POA: Diagnosis not present

## 2021-01-03 NOTE — Therapy (Signed)
[facility] [address] [place], [place], [address] Phone: [phone]   Fax:  [phone]  Physical Therapy Treatment  Patient Details  Name: Robert [name] MRN: [id number] Date of Birth: January 27, 1941 Referring Provider (PT): Vertis Kelch Date: 01/03/2021   PT End of Session - 01/03/21 1055     Visit Number 15    Number of Visits 23    Date for PT Re-Evaluation 02/14/21    PT Start Time 1000    PT Stop Time 1053    PT Time Calculation (min) 53 min    Activity Tolerance Patient tolerated treatment well    Behavior During Therapy [facility] for tasks assessed/performed             Past Medical History:  Diagnosis Date   A-fib (HCC)    HTN (hypertension)     Past Surgical History:  Procedure Laterality Date   DENTAL SURGERY      There were no vitals filed for this visit.   Subjective Assessment - 01/03/21 0950     Subjective Nothing new. I think it is helping the therapy.  I would like to do more.  I feel like the rest of the day I feel much less tight    Pertinent History Cancer of lower gum, stage IVA (T4a, N2a, M0), presented with left buccal squamous cell carcinoma in 2019. A biopsy and reconstruction were completed in October 2019, 08/08/20 Mr. Robert [name] followed up with Dr. Erroll Luna and mentioned a mass that he had noticed to his right anterior mandible in Jan 2022. Dr. Erroll Luna obtained a sample for cytology which revealed highly atypical squamous cells suspicious for [facility], 08/15/20 CT neck revealed revealed a large mass arising from the buccal mucosal surface of the right lower lip, compatible with recurrent squamous cell carcinoma. Chest CT taken on the same day showed no evidence of metastatic disease within the chest, 09/03/20 Dr. Hezzie Bump completed a through and through lip resection with marginal mandibulectomy, bilateral neck dissection and flap reconstruction, will receive 33 fractions of radiation to his right  lower gum area and a lower dose to his bilateral neck. Chemotherapy may be started at a later date. He started on 10/15/20 and will complete 11/29/20.    Currently in Pain? No/denies                   LYMPHEDEMA/ONCOLOGY QUESTIONNAIRE - 01/03/21 0001       Head and Neck   4 cm superior to sternal notch around neck 40.5 cm    6 cm superior to sternal notch around neck 40 cm    8 cm superior to sternal notch around neck 40.5 cm    Other 25.5   tragus to tragus chin strap                       [facility] Adult PT Treatment/Exercise - 01/03/21 0001       Manual Therapy   Soft tissue mobilization with coco butter;anterior neck, incision work gentle pressure    Manual Lymphatic Drainage (MLD) short neck, SCF,  bil axillary nodes, bilateral chest, posterior,medial anterior neck into the river,anterior throat , submandibular area, chin especially over area of most prominent Rt sided swelling (included scar massage here as well during), bil masseters, and then retraced all steps.    Passive ROM manual traction, suboccipital release, bil cervical rotation and side bending  PT Long Term Goals - 01/03/21 1002       PT LONG TERM GOAL #1   Title Pt will report a 50% reduction in swelling of chin and anterior neck to decrease risk of infection    Baseline The face is much better.  .The neck is better by 25% or so    Status On-going      PT LONG TERM GOAL #2   Title Pt will be independent in self MLD for long term management of edema.    Baseline I can do it but I don't like to do it    Status On-going      PT LONG TERM GOAL #3   Title Pt will obtain appropriate compression garments for long term management of edema.    Baseline partially met - has homemade chip pack and ordering information    Status Partially Met      PT LONG TERM GOAL #4   Title Pt will demonstrate improve cervical extension that is only 25% limited to allow  improved motion.    Baseline 50% limited    Status On-going                   Plan - 01/03/21 1056     Clinical Impression Statement Overall pt is having benefits from PT sessions mainly with softening of scar tissue and fibrosis in ther anterior neck.  Pt has not seen any reduction in size but may be due to soft edema vs scar tissue/skin.  Pt would like to get a pump if able but insurance will not cover most likely.  Will extend POC for more visits to decrease fibrosis in the anterior neck, improve AROM, and manage self care.    PT Frequency 2x / week    PT Duration 4 weeks    PT Treatment/Interventions ADLs/Self Care Home Management;Therapeutic exercise;Patient/family education;Manual techniques;Manual lymph drainage;Compression bandaging;Scar mobilization;Passive range of motion;Taping;Vasopneumatic Device    PT Next Visit Plan Cont and review MLD to neck, cont cervical PROM, scar mobilization pending how skin is doing from radiation; review postural band exs    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 classified  Disorder of the skin and subcutaneous tissue related to radiation, unspecified  Aftercare following surgery for neoplasm  Abnormal posture  Malignant neoplasm of lower gum (HCC)     Problem List Patient Active Problem List   Diagnosis Date Noted   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/2020   Permanent atrial fibrillation (HCC) 10/22/2018   Non-rheumatic mitral regurgitation 10/22/2018   Obesity (BMI 30-39.9) 10/22/2018    Robert [name], PT 01/03/2021, 10:59 AM  [facility] [address] [place], [place], [address] Phone: [phone]   Fax:  [phone]  Name: Robert [name] MRN: [id number] Date of Birth: [birth year]

## 2021-01-08 ENCOUNTER — Ambulatory Visit: Payer: Medicare Other | Admitting: Physical Therapy

## 2021-01-11 ENCOUNTER — Other Ambulatory Visit: Payer: Self-pay

## 2021-01-11 ENCOUNTER — Ambulatory Visit: Payer: Medicare Other | Admitting: Physical Therapy

## 2021-01-11 DIAGNOSIS — R293 Abnormal posture: Secondary | ICD-10-CM

## 2021-01-11 DIAGNOSIS — I890 Lymphedema, not elsewhere classified: Secondary | ICD-10-CM | POA: Diagnosis not present

## 2021-01-11 DIAGNOSIS — L599 Disorder of the skin and subcutaneous tissue related to radiation, unspecified: Secondary | ICD-10-CM | POA: Diagnosis not present

## 2021-01-11 DIAGNOSIS — Z483 Aftercare following surgery for neoplasm: Secondary | ICD-10-CM | POA: Diagnosis not present

## 2021-01-11 DIAGNOSIS — R1312 Dysphagia, oropharyngeal phase: Secondary | ICD-10-CM | POA: Diagnosis not present

## 2021-01-11 DIAGNOSIS — C031 Malignant neoplasm of lower gum: Secondary | ICD-10-CM | POA: Diagnosis not present

## 2021-01-11 NOTE — Therapy (Signed)
[place], [place], [address] Phone: [phone]   Fax:  [phone]  Physical Therapy Treatment  Patient Details  Name: Robert [name] MRN: [id number] Date of Birth: 1940-12-23 Referring Provider (PT): Reita May Date: 01/11/2021   PT End of Session - 01/11/21 1053     Visit Number 16    Number of Visits 23    Date for PT Re-Evaluation 02/14/21    PT Start Time 1000    PT Stop Time 1054    PT Time Calculation (min) 54 min    Activity Tolerance Patient tolerated treatment well    Behavior During Therapy [facility] for tasks assessed/performed             Past Medical History:  Diagnosis Date   A-fib ([place])    HTN (hypertension)     Past Surgical History:  Procedure Laterality Date   DENTAL SURGERY      There were no vitals filed for this visit.   Subjective Assessment - 01/11/21 1006     Subjective Pt states that he is still having some fullness especially in his neck . He has not decided on getting a garment, but is thinking of getting the [facility] to see if it will will help keep the chip pack on on.    Pertinent History Cancer of lower gum, stage IVA (T4a, N2a, M0), presented with left buccal squamous cell carcinoma in 2019. A biopsy and reconstruction were completed in October 2019, 08/08/20 Mr. [name] followed up with Dr. Vicie Mutters and mentioned a mass that he had noticed to his right anterior mandible in Jan 2022. Dr. Vicie Mutters obtained a sample for cytology which revealed highly atypical squamous cells suspicious for [facility], 08/15/20 CT neck revealed revealed a large mass arising from the buccal mucosal surface of the right lower lip, compatible with recurrent squamous cell carcinoma. Chest CT taken on the same day showed no evidence of metastatic disease within the chest, 09/03/20 Dr. Nicolette Bang completed a through and through lip resection with marginal mandibulectomy, bilateral neck dissection and  flap reconstruction, will receive 33 fractions of radiation to his right lower gum area and a lower dose to his bilateral neck. Chemotherapy may be started at a later date. He started on 10/15/20 and will complete 11/29/20.    Patient Stated Goals to gain info from providers    Currently in Pain? No/denies                               [facility] Adult PT Treatment/Exercise - 01/11/21 0001       Manual Therapy   Manual Therapy Manual Lymphatic Drainage (MLD);Myofascial release;Passive ROM    Manual therapy comments gave pt another piece of small dotted peach foam on white foam and encouraged pt to order marena garment    Myofascial Release to neck and skin at anterior neck    Manual Lymphatic Drainage (MLD) short neck, SCF,  bil axillary nodes, bilateral chest, posterior,medial anterior neck into the river,anterior throat , submandibular area, chin especially over area of most prominent Rt sided swelling (included scar massage here as well during), bil masseters, and then retraced all steps.    Passive ROM manual traction, suboccipital release, bil cervical rotation and side bending                          PT Long Term  Goals - 01/03/21 1002       PT LONG TERM GOAL #1   Title Pt will report a 50% reduction in swelling of chin and anterior neck to decrease risk of infection    Baseline The face is much better.  .The neck is better by 25% or so    Status On-going      PT LONG TERM GOAL #2   Title Pt will be independent in self MLD for long term management of edema.    Baseline I can do it but I don't like to do it    Status On-going      PT LONG TERM GOAL #3   Title Pt will obtain appropriate compression garments for long term management of edema.    Baseline partially met - has homemade chip pack and ordering information    Status Partially Met      PT LONG TERM GOAL #4   Title Pt will demonstrate improve cervical extension that is only 25% limited to  allow improved motion.    Baseline 50% limited    Status On-going                   Plan - 01/11/21 1054     Clinical Impression Statement Pt continues to make improvement with softeneing of chin and neck.  He still has firm fibrosis with decreased scar mobility in neck  that softens with gentle pressure and MLD. Feel he will improve with compression garments and pt states he will order the [place] and use it to hold the peach foam in place. Pt is increasing activity and exericse at home and has returned to golf.    Stability/Clinical Decision Making Stable/Uncomplicated    Rehab Potential Good    PT Frequency 2x / week    PT Duration 4 weeks    PT Treatment/Interventions ADLs/Self Care Home Management;Therapeutic exercise;Patient/family education;Manual techniques;Manual lymph drainage;Compression bandaging;Scar mobilization;Passive range of motion;Taping;Vasopneumatic Device    PT Next Visit Plan Cont and review MLD to neck, cont cervical PROM, scar mobilization pending how skin is doing from radiation; review postural band exs    PT Home Exercise Plan head and neck ROM exercises; wear chip pack and perform self MLD both daily    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 classified  Disorder of the skin and subcutaneous tissue related to radiation, unspecified  Aftercare following surgery for neoplasm  Abnormal posture     Problem List Patient Active Problem List   Diagnosis Date Noted   Cancer of lower gum ([place]) 10/05/2020   Permanent atrial fibrillation ([place]) 10/22/2018   Non-rheumatic mitral regurgitation 10/22/2018   Obesity (BMI 30-39.9) 10/22/2018    Robert [name]. Owens Shark PT  Norwood Levo, PT 01/11/2021, 10:57 AM  [place], [place], [address] Phone: [phone]   Fax:  [phone]  Name: Robert [name] MRN: [id number] Date of Birth: 06-26-1940

## 2021-01-15 ENCOUNTER — Other Ambulatory Visit: Payer: Self-pay

## 2021-01-15 ENCOUNTER — Ambulatory Visit: Payer: Medicare Other

## 2021-01-15 DIAGNOSIS — R1312 Dysphagia, oropharyngeal phase: Secondary | ICD-10-CM | POA: Diagnosis not present

## 2021-01-15 DIAGNOSIS — C031 Malignant neoplasm of lower gum: Secondary | ICD-10-CM | POA: Diagnosis not present

## 2021-01-15 DIAGNOSIS — I890 Lymphedema, not elsewhere classified: Secondary | ICD-10-CM

## 2021-01-15 DIAGNOSIS — Z483 Aftercare following surgery for neoplasm: Secondary | ICD-10-CM | POA: Diagnosis not present

## 2021-01-15 DIAGNOSIS — R293 Abnormal posture: Secondary | ICD-10-CM

## 2021-01-15 DIAGNOSIS — L599 Disorder of the skin and subcutaneous tissue related to radiation, unspecified: Secondary | ICD-10-CM

## 2021-01-15 NOTE — Therapy (Signed)
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 classified  Disorder of the skin and subcutaneous tissue related to radiation, unspecified  Aftercare following surgery for neoplasm  Abnormal posture  Malignant neoplasm of lower gum ([place])     Problem List Patient Active Problem List   Diagnosis Date Noted   Cancer of lower gum ([place]) 10/05/2020   Permanent atrial fibrillation ([place]) 10/22/2018   Non-rheumatic mitral regurgitation 10/22/2018   Obesity (BMI 30-39.9) 10/22/2018    Claris Pong, PT 01/15/2021, 10:34 AM  [place], [place], [address] Phone: [phone]   Fax:  [phone]  Name: Danilo Cappiello MRN: [id number] Date of Birth: 07-31-1940  [place], [place], [address] Phone: [phone]   Fax:  [phone]  Physical Therapy Treatment  Patient Details  Name: Bob Daversa MRN: [id number] Date of Birth: 01-07-1941 Referring Provider (PT): Reita May Date: 01/15/2021   PT End of Session - 01/15/21 0900     Visit Number [place]   Number of Visits 23    Date for PT Re-Evaluation 02/14/21    PT Start Time 0804   KX   PT Stop Time 0856    PT Time Calculation (min) 52 min    Behavior During Therapy [facility] for tasks assessed/performed             Past Medical History:  Diagnosis Date   A-fib ([place])    HTN (hypertension)     Past Surgical History:  Procedure Laterality Date   DENTAL SURGERY      There were no vitals filed for this visit.   Subjective Assessment - 01/15/21 0802     Subjective The treatments seem to help but they don't last long. I have not ordered the [place] yet, but I am going to. Swelling continues to go down some, but it is still there.  Neck swelling seems to be more in the neck now than the jaw line.    Pertinent History Cancer of lower gum, stage IVA (T4a, N2a, M0), presented with left buccal squamous cell carcinoma in 2019. A biopsy and reconstruction were completed in October 2019, 08/08/20 Mr. [name] followed up with Dr. Vicie Mutters and mentioned a mass that he had noticed to his right anterior mandible in Jan 2022. Dr. Vicie Mutters obtained a sample for cytology which revealed highly atypical squamous cells suspicious for [facility], 08/15/20 CT neck revealed revealed a large mass arising from the buccal mucosal surface of the right lower lip, compatible with recurrent squamous cell carcinoma. Chest CT taken on the same day showed no evidence of metastatic disease within the chest, 09/03/20 Dr. Nicolette Bang completed a through and through lip resection with marginal mandibulectomy, bilateral neck dissection and flap  reconstruction, will receive 33 fractions of radiation to his right lower gum area and a lower dose to his bilateral neck. Chemotherapy may be started at a later date. He started on 10/15/20 and will complete 11/29/20.    Patient Stated Goals to gain info from providers    Currently in Pain? No/denies    Pain Score 0-No pain                [facility] PT Assessment - 01/15/21 0001       AROM   Cervical Flexion WFL    Cervical Extension 60%limited    Cervical - Right Side Bend 50% limited    Cervical - Left Side Bend 50 % limited    Cervical - Right Rotation 25% limited    Cervical - Left Rotation 25% limited                           [facility] Adult PT Treatment/Exercise - 01/15/21 0001       Exercises   Other Exercises  Cervical rotation and SB x 5 ea Bilaterally for warm up prior to assessing. Discussed ways to progress     Manual Therapy   Edema Management Pt told he must order [place] today for benefit of holding in foam pads    Soft tissue mobilization with coco buter  [place], [place], [address] Phone: [phone]   Fax:  [phone]  Physical Therapy Treatment  Patient Details  Name: Bob Daversa MRN: [id number] Date of Birth: 01-07-1941 Referring Provider (PT): Reita May Date: 01/15/2021   PT End of Session - 01/15/21 0900     Visit Number [place]   Number of Visits 23    Date for PT Re-Evaluation 02/14/21    PT Start Time 0804   KX   PT Stop Time 0856    PT Time Calculation (min) 52 min    Behavior During Therapy [facility] for tasks assessed/performed             Past Medical History:  Diagnosis Date   A-fib ([place])    HTN (hypertension)     Past Surgical History:  Procedure Laterality Date   DENTAL SURGERY      There were no vitals filed for this visit.   Subjective Assessment - 01/15/21 0802     Subjective The treatments seem to help but they don't last long. I have not ordered the [place] yet, but I am going to. Swelling continues to go down some, but it is still there.  Neck swelling seems to be more in the neck now than the jaw line.    Pertinent History Cancer of lower gum, stage IVA (T4a, N2a, M0), presented with left buccal squamous cell carcinoma in 2019. A biopsy and reconstruction were completed in October 2019, 08/08/20 Mr. [name] followed up with Dr. Vicie Mutters and mentioned a mass that he had noticed to his right anterior mandible in Jan 2022. Dr. Vicie Mutters obtained a sample for cytology which revealed highly atypical squamous cells suspicious for [facility], 08/15/20 CT neck revealed revealed a large mass arising from the buccal mucosal surface of the right lower lip, compatible with recurrent squamous cell carcinoma. Chest CT taken on the same day showed no evidence of metastatic disease within the chest, 09/03/20 Dr. Nicolette Bang completed a through and through lip resection with marginal mandibulectomy, bilateral neck dissection and flap  reconstruction, will receive 33 fractions of radiation to his right lower gum area and a lower dose to his bilateral neck. Chemotherapy may be started at a later date. He started on 10/15/20 and will complete 11/29/20.    Patient Stated Goals to gain info from providers    Currently in Pain? No/denies    Pain Score 0-No pain                [facility] PT Assessment - 01/15/21 0001       AROM   Cervical Flexion WFL    Cervical Extension 60%limited    Cervical - Right Side Bend 50% limited    Cervical - Left Side Bend 50 % limited    Cervical - Right Rotation 25% limited    Cervical - Left Rotation 25% limited                           [facility] Adult PT Treatment/Exercise - 01/15/21 0001       Exercises   Other Exercises  Cervical rotation and SB x 5 ea Bilaterally for warm up prior to assessing. Discussed ways to progress     Manual Therapy   Edema Management Pt told he must order [place] today for benefit of holding in foam pads    Soft tissue mobilization with coco buter

## 2021-01-16 ENCOUNTER — Encounter: Payer: Self-pay | Admitting: Rehabilitation

## 2021-01-16 ENCOUNTER — Ambulatory Visit: Payer: Medicare Other | Admitting: Rehabilitation

## 2021-01-16 DIAGNOSIS — C031 Malignant neoplasm of lower gum: Secondary | ICD-10-CM | POA: Diagnosis not present

## 2021-01-16 DIAGNOSIS — Z483 Aftercare following surgery for neoplasm: Secondary | ICD-10-CM

## 2021-01-16 DIAGNOSIS — R293 Abnormal posture: Secondary | ICD-10-CM | POA: Diagnosis not present

## 2021-01-16 DIAGNOSIS — R1312 Dysphagia, oropharyngeal phase: Secondary | ICD-10-CM | POA: Diagnosis not present

## 2021-01-16 DIAGNOSIS — L599 Disorder of the skin and subcutaneous tissue related to radiation, unspecified: Secondary | ICD-10-CM

## 2021-01-16 DIAGNOSIS — I890 Lymphedema, not elsewhere classified: Secondary | ICD-10-CM | POA: Diagnosis not present

## 2021-01-16 NOTE — Therapy (Signed)
[place], [place], [address] Phone: [phone]   Fax:  [phone]  Physical Therapy Treatment  Patient Details  Name: Robert [name] MRN: [id number] Date of Birth: 1940/11/27 Referring Provider (PT): Reita May Date: 01/16/2021   PT End of Session - 01/16/21 0853     Visit Number 18    Number of Visits 23    Date for PT Re-Evaluation 02/14/21    PT Start Time 0802    PT Stop Time 0848    PT Time Calculation (min) 46 min    Activity Tolerance Patient tolerated treatment well    Behavior During Therapy [facility] for tasks assessed/performed             Past Medical History:  Diagnosis Date   A-fib ([place])    HTN (hypertension)     Past Surgical History:  Procedure Laterality Date   DENTAL SURGERY      There were no vitals filed for this visit.   Subjective Assessment - 01/16/21 0804     Subjective I finally ordered my garment    Pertinent History Cancer of lower gum, stage IVA (T4a, N2a, M0), presented with left buccal squamous cell carcinoma in 2019. A biopsy and reconstruction were completed in October 2019, 08/08/20 Mr. [name] followed up with Dr. Vicie Mutters and mentioned a mass that he had noticed to his right anterior mandible in Jan 2022. Dr. Vicie Mutters obtained a sample for cytology which revealed highly atypical squamous cells suspicious for [facility], 08/15/20 CT neck revealed revealed a large mass arising from the buccal mucosal surface of the right lower lip, compatible with recurrent squamous cell carcinoma. Chest CT taken on the same day showed no evidence of metastatic disease within the chest, 09/03/20 Dr. Nicolette Bang completed a through and through lip resection with marginal mandibulectomy, bilateral neck dissection and flap reconstruction, will receive 33 fractions of radiation to his right lower gum area and a lower dose to his bilateral neck. Chemotherapy may be started at a later date. He  started on 10/15/20 and will complete 11/29/20.    Currently in Pain? No/denies                               [facility] Adult PT Treatment/Exercise - 01/16/21 0001       Exercises   Other Exercises  cervical rotation and extension 2x10" each      Manual Therapy   Soft tissue mobilization with coco buter to bilateral  UT/Levator, SCM/scalenes, posterior cervicals prior to PROM    Manual Lymphatic Drainage (MLD) short neck, SCF,  bil axillary nodes, bilateral chest, posterior,medial anterior neck into the river,anterior throat , submandibular area, chin especially over area of most prominent Rt sided swelling (included scar massage here as well during), bil masseters, and then retraced all steps.    Passive ROM bil cervical rotation and side bending                          PT Long Term Goals - 01/03/21 1002       PT LONG TERM GOAL #1   Title Pt will report a 50% reduction in swelling of chin and anterior neck to decrease risk of infection    Baseline The face is much better.  .The neck is better by 25% or so    Status On-going  PT LONG TERM GOAL #2   Title Pt will be independent in self MLD for long term management of edema.    Baseline I can do it but I don't like to do it    Status On-going      PT LONG TERM GOAL #3   Title Pt will obtain appropriate compression garments for long term management of edema.    Baseline partially met - has homemade chip pack and ordering information    Status Partially Met      PT LONG TERM GOAL #4   Title Pt will demonstrate improve cervical extension that is only 25% limited to allow improved motion.    Baseline 50% limited    Status On-going                   Plan - 01/16/21 0853     Clinical Impression Statement pt ordered marena garment yesterday.  Has one more visit next week hopefully we can check the garment with the foam and issue final instruction for DC.    PT Frequency 2x / week    PT  Duration 4 weeks    PT Treatment/Interventions ADLs/Self Care Home Management;Therapeutic exercise;Patient/family education;Manual techniques;Manual lymph drainage;Compression bandaging;Scar mobilization;Passive range of motion;Taping;Vasopneumatic Device    PT Next Visit Plan Use KX modifier,Cont and review MLD to neck, cont cervical PROM, scar mobilization pending how skin is doing from radiation; review postural band exs. pt is ordering [employer]pe today ( 01/15/2021    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 classified  Disorder of the skin and subcutaneous tissue related to radiation, unspecified  Aftercare following surgery for neoplasm  Abnormal posture  Malignant neoplasm of lower gum ([place])     Problem List Patient Active Problem List   Diagnosis Date Noted   Cancer of lower gum ([place]) 10/05/2020   Permanent atrial fibrillation ([place]) 10/22/2018   Non-rheumatic mitral regurgitation 10/22/2018   Obesity (BMI 30-39.9) 10/22/2018    [name], PT 01/16/2021, 8:55 AM  [place], [place], [address] Phone: [phone]   Fax:  [phone]  Name: Robert [name] MRN: [id number] Date of Birth: 1940-10-21

## 2021-01-22 ENCOUNTER — Ambulatory Visit: Payer: Medicare Other | Attending: Radiation Oncology | Admitting: Rehabilitation

## 2021-01-22 ENCOUNTER — Other Ambulatory Visit: Payer: Self-pay

## 2021-01-22 DIAGNOSIS — I890 Lymphedema, not elsewhere classified: Secondary | ICD-10-CM | POA: Diagnosis not present

## 2021-01-22 DIAGNOSIS — Z23 Encounter for immunization: Secondary | ICD-10-CM | POA: Diagnosis not present

## 2021-01-22 DIAGNOSIS — L599 Disorder of the skin and subcutaneous tissue related to radiation, unspecified: Secondary | ICD-10-CM | POA: Diagnosis not present

## 2021-01-22 DIAGNOSIS — R293 Abnormal posture: Secondary | ICD-10-CM | POA: Insufficient documentation

## 2021-01-22 DIAGNOSIS — Z483 Aftercare following surgery for neoplasm: Secondary | ICD-10-CM | POA: Diagnosis not present

## 2021-01-22 NOTE — Therapy (Addendum)
[facility] @ [facility] [address] [place], [place], [address] Phone:     Fax:     Physical Therapy Treatment  Patient Details  Name: Robert [name] MRN: [id number] Date of Birth: 1940/12/06 Referring Provider (PT): Vertis Kelch Date: 01/22/2021   PT End of Session - 01/22/21 1053     Visit Number 19    Number of Visits 23    Date for PT Re-Evaluation 02/14/21    PT Start Time 1000    PT Stop Time 1050    PT Time Calculation (min) 50 min    Activity Tolerance Patient tolerated treatment well    Behavior During Therapy [facility] for tasks assessed/performed             Past Medical History:  Diagnosis Date   A-fib (HCC)    HTN (hypertension)     Past Surgical History:  Procedure Laterality Date   DENTAL SURGERY      There were no vitals filed for this visit.   Subjective Assessment - 01/22/21 0959     Subjective I didnt get my garment yet. I can do all the stretches ok, I'm not sure I know what I'm doing for the massage    Pertinent History Cancer of lower gum, stage IVA (T4a, N2a, M0), presented with left buccal squamous cell carcinoma in 2019. A biopsy and reconstruction were completed in October 2019, 08/08/20 [name] followed up with Dr. Erroll Luna and mentioned a mass that he had noticed to his right anterior mandible in Jan 2022. Dr. Erroll Luna obtained a sample for cytology which revealed highly atypical squamous cells suspicious for [facility], 08/15/20 CT neck revealed revealed a large mass arising from the buccal mucosal surface of the right lower lip, compatible with recurrent squamous cell carcinoma. Chest CT taken on the same day showed no evidence of metastatic disease within the chest, 09/03/20 Dr. Hezzie Bump completed a through and through lip resection with marginal mandibulectomy, bilateral neck dissection and flap reconstruction, will receive 33 fractions of radiation to his right lower gum area and a lower dose to his  bilateral neck. Chemotherapy may be started at a later date. He started on 10/15/20 and will complete 11/29/20.    Currently in Pain? No/denies                               [facility] Adult PT Treatment/Exercise - 01/22/21 0001       Manual Therapy   Manual therapy comments reviewed all steps of self MLD in seated wit new handout as pt reports he doesn't have the one with pictures    Edema Management gave pt norton handout anterior approach    Soft tissue mobilization with coco buter to bilateral  UT/Levator, SCM/scalenes, posterior cervicals prior to PROM    Myofascial Release to neck and skin at anterior neck    Manual Lymphatic Drainage (MLD) short neck, SCF,  bil axillary nodes, bilateral chest, posterior,medial anterior neck into the river,anterior throat , submandibular area, chin especially over area of most prominent Rt sided swelling (included scar massage here as well during), bil masseters, and then retraced all steps.                          PT Long Term Goals - 01/03/21 1002       PT LONG TERM GOAL #1  Title Pt will report a 50% reduction in swelling of chin and anterior neck to decrease risk of infection    Baseline The face is much better.  .The neck is better by 25% or so    Status On-going      PT LONG TERM GOAL #2   Title Pt will be independent in self MLD for long term management of edema.    Baseline I can do it but I don't like to do it    Status On-going      PT LONG TERM GOAL #3   Title Pt will obtain appropriate compression garments for long term management of edema.    Baseline partially met - has homemade chip pack and ordering information    Status Partially Met      PT LONG TERM GOAL #4   Title Pt will demonstrate improve cervical extension that is only 25% limited to allow improved motion.    Baseline 50% limited    Status On-going                   Plan - 01/22/21 1053     Clinical Impression Statement  Has not received his Marena garment yet.  Reeducated pt on use of foams he has and how to apply the garment when he gets it as we won't see him back for at least a few weeks due to the schedule and him going to the beach.  When he returns with it we will assess foams and add as needed and see if he has any questions about self MLD.    PT Frequency 2x / week    PT Duration 4 weeks    PT Treatment/Interventions ADLs/Self Care Home Management;Therapeutic exercise;Patient/family education;Manual techniques;Manual lymph drainage;Compression bandaging;Scar mobilization;Passive range of motion;Taping;Vasopneumatic Device    PT Next Visit Plan Use KX modifier,Cont and review MLD to neck, cont cervical PROM, scar mobilization pending how skin is doing from radiation; review postural band exs. pt is ordering Nunzio Cory today ( 01/15/2021    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 classified  Disorder of the skin and subcutaneous tissue related to radiation, unspecified  Aftercare following surgery for neoplasm  Abnormal posture     Problem List Patient Active Problem List   Diagnosis Date Noted   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/2020   Permanent atrial fibrillation (HCC) 10/22/2018   Non-rheumatic mitral regurgitation 10/22/2018   Obesity (BMI 30-39.9) 10/22/2018    [name], PT 01/22/2021, 10:56 AM  [facility] @ [facility] [address] [place], [place], [address] Phone:     Fax:     Name: Robert [name] MRN: [id number] Date of Birth: 11/10/1940   PHYSICAL THERAPY DISCHARGE SUMMARY  Visits from Start of Care: 19  Current  functional level related to goals / functional outcomes: Did not return after last visit   Remaining deficits: Chronic lymphedema post radiation   Education / Equipment: Final HEP  Plan: Patient agrees to discharge.  Patient goals were not met. Patient is being discharged due to meeting the stated rehab goals.

## 2021-02-08 ENCOUNTER — Encounter (HOSPITAL_COMMUNITY): Payer: Self-pay | Admitting: Emergency Medicine

## 2021-02-08 ENCOUNTER — Inpatient Hospital Stay (HOSPITAL_COMMUNITY)
Admission: EM | Admit: 2021-02-08 | Discharge: 2021-02-12 | DRG: 478 | Disposition: A | Discharge status: Home / Self Care | Payer: Medicare Other | Attending: Internal Medicine | Admitting: Internal Medicine

## 2021-02-08 ENCOUNTER — Emergency Department (HOSPITAL_COMMUNITY): Payer: Medicare Other

## 2021-02-08 ENCOUNTER — Other Ambulatory Visit: Payer: Self-pay

## 2021-02-08 DIAGNOSIS — Z23 Encounter for immunization: Secondary | ICD-10-CM | POA: Diagnosis not present

## 2021-02-08 DIAGNOSIS — M48061 Spinal stenosis, lumbar region without neurogenic claudication: Secondary | ICD-10-CM | POA: Diagnosis not present

## 2021-02-08 DIAGNOSIS — C787 Secondary malignant neoplasm of liver and intrahepatic bile duct: Secondary | ICD-10-CM

## 2021-02-08 DIAGNOSIS — M545 Low back pain, unspecified: Secondary | ICD-10-CM | POA: Diagnosis not present

## 2021-02-08 DIAGNOSIS — C3492 Malignant neoplasm of unspecified part of left bronchus or lung: Secondary | ICD-10-CM | POA: Diagnosis present

## 2021-02-08 DIAGNOSIS — D72828 Other elevated white blood cell count: Secondary | ICD-10-CM | POA: Diagnosis present

## 2021-02-08 DIAGNOSIS — Z7901 Long term (current) use of anticoagulants: Secondary | ICD-10-CM

## 2021-02-08 DIAGNOSIS — M4807 Spinal stenosis, lumbosacral region: Secondary | ICD-10-CM | POA: Diagnosis not present

## 2021-02-08 DIAGNOSIS — R52 Pain, unspecified: Secondary | ICD-10-CM | POA: Diagnosis present

## 2021-02-08 DIAGNOSIS — I4821 Permanent atrial fibrillation: Secondary | ICD-10-CM | POA: Diagnosis present

## 2021-02-08 DIAGNOSIS — I10 Essential (primary) hypertension: Secondary | ICD-10-CM | POA: Diagnosis present

## 2021-02-08 DIAGNOSIS — D72829 Elevated white blood cell count, unspecified: Secondary | ICD-10-CM | POA: Diagnosis present

## 2021-02-08 DIAGNOSIS — D649 Anemia, unspecified: Secondary | ICD-10-CM | POA: Diagnosis present

## 2021-02-08 DIAGNOSIS — C031 Malignant neoplasm of lower gum: Secondary | ICD-10-CM | POA: Diagnosis present

## 2021-02-08 DIAGNOSIS — M5126 Other intervertebral disc displacement, lumbar region: Secondary | ICD-10-CM | POA: Diagnosis not present

## 2021-02-08 DIAGNOSIS — Z87891 Personal history of nicotine dependence: Secondary | ICD-10-CM

## 2021-02-08 DIAGNOSIS — I9589 Other hypotension: Secondary | ICD-10-CM | POA: Diagnosis not present

## 2021-02-08 DIAGNOSIS — Z79899 Other long term (current) drug therapy: Secondary | ICD-10-CM

## 2021-02-08 DIAGNOSIS — Z20822 Contact with and (suspected) exposure to covid-19: Secondary | ICD-10-CM | POA: Diagnosis present

## 2021-02-08 DIAGNOSIS — C7951 Secondary malignant neoplasm of bone: Secondary | ICD-10-CM | POA: Diagnosis not present

## 2021-02-08 DIAGNOSIS — D6489 Other specified anemias: Secondary | ICD-10-CM | POA: Diagnosis present

## 2021-02-08 DIAGNOSIS — M4699 Unspecified inflammatory spondylopathy, multiple sites in spine: Secondary | ICD-10-CM | POA: Diagnosis not present

## 2021-02-08 DIAGNOSIS — Z923 Personal history of irradiation: Secondary | ICD-10-CM

## 2021-02-08 DIAGNOSIS — M549 Dorsalgia, unspecified: Secondary | ICD-10-CM | POA: Diagnosis present

## 2021-02-08 LAB — CBC WITH DIFFERENTIAL/PLATELET
Abs Immature Granulocytes: 0.92 10*3/uL — ABNORMAL HIGH (ref 0.00–0.07)
Basophils Absolute: 0.1 10*3/uL (ref 0.0–0.1)
Basophils Relative: 0 %
Eosinophils Absolute: 0 10*3/uL (ref 0.0–0.5)
Eosinophils Relative: 0 %
HCT: 39.8 % (ref 39.0–52.0)
Hemoglobin: 12.5 g/dL — ABNORMAL LOW (ref 13.0–17.0)
Immature Granulocytes: 2 %
Lymphocytes Relative: 1 %
Lymphs Abs: 0.5 10*3/uL — ABNORMAL LOW (ref 0.7–4.0)
MCH: 28 pg (ref 26.0–34.0)
MCHC: 31.4 g/dL (ref 30.0–36.0)
MCV: 89 fL (ref 80.0–100.0)
Monocytes Absolute: 1.8 10*3/uL — ABNORMAL HIGH (ref 0.1–1.0)
Monocytes Relative: 5 %
Neutro Abs: 34.8 10*3/uL — ABNORMAL HIGH (ref 1.7–7.7)
Neutrophils Relative %: 92 %
Platelets: 356 10*3/uL (ref 150–400)
RBC: 4.47 MIL/uL (ref 4.22–5.81)
RDW: 13.3 % (ref 11.5–15.5)
WBC: 38.2 10*3/uL — ABNORMAL HIGH (ref 4.0–10.5)
nRBC: 0 % (ref 0.0–0.2)

## 2021-02-08 LAB — URINALYSIS, ROUTINE W REFLEX MICROSCOPIC
Bilirubin Urine: NEGATIVE
Glucose, UA: NEGATIVE mg/dL
Hgb urine dipstick: NEGATIVE
Ketones, ur: 5 mg/dL — AB
Leukocytes,Ua: NEGATIVE
Nitrite: NEGATIVE
Protein, ur: NEGATIVE mg/dL
Specific Gravity, Urine: 1.016 (ref 1.005–1.030)
pH: 5 (ref 5.0–8.0)

## 2021-02-08 LAB — COMPREHENSIVE METABOLIC PANEL
ALT: 25 U/L (ref 0–44)
AST: 32 U/L (ref 15–41)
Albumin: 3.6 g/dL (ref 3.5–5.0)
Alkaline Phosphatase: 136 U/L — ABNORMAL HIGH (ref 38–126)
Anion gap: 8 (ref 5–15)
BUN: 28 mg/dL — ABNORMAL HIGH (ref 8–23)
CO2: 31 mmol/L (ref 22–32)
Calcium: 13.4 mg/dL (ref 8.9–10.3)
Chloride: 97 mmol/L — ABNORMAL LOW (ref 98–111)
Creatinine, Ser: 1.26 mg/dL — ABNORMAL HIGH (ref 0.61–1.24)
GFR, Estimated: 59 mL/min — ABNORMAL LOW (ref >60)
Glucose, Bld: 124 mg/dL — ABNORMAL HIGH (ref 70–99)
Potassium: 3.8 mmol/L (ref 3.5–5.1)
Sodium: 136 mmol/L (ref 135–145)
Total Bilirubin: 0.7 mg/dL (ref 0.3–1.2)
Total Protein: 8.5 g/dL — ABNORMAL HIGH (ref 6.5–8.1)

## 2021-02-08 LAB — LACTIC ACID, PLASMA: Lactic Acid, Venous: 2.1 mmol/L (ref 0.5–1.9)

## 2021-02-08 IMAGING — MR MR LUMBAR SPINE W/O CM
4 of 5 series · 31 of 48 positions shown · non-contrast
Comparison: None.

CLINICAL DATA: Low back pain, cancer suspected; technologist note
states low back pain and bilateral leg weakness

EXAM:
MRI LUMBAR SPINE WITHOUT CONTRAST
TECHNIQUE: Multiplanar, multisequence MR imaging of the lumbar spine was
performed. No intravenous contrast was administered.

[Series 6: T1 · sagittal · 4.0mm · 0.81mm/px · 6 of 16 slices shown (1 of 2)]
[im 1/16]
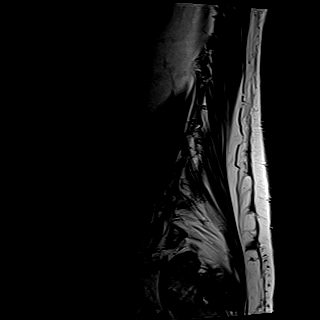
[im 4/16]
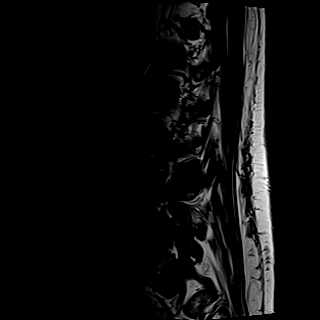
[im 7/16]
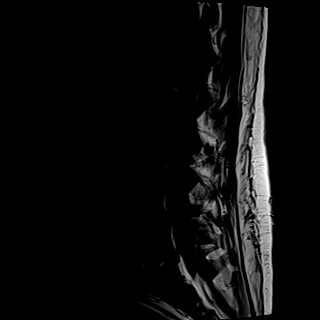
[im 10/16]
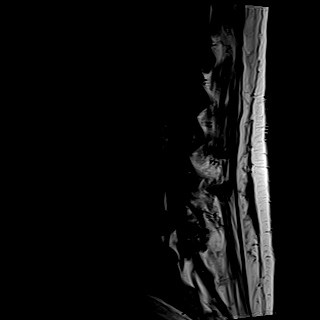
[im 13/16]
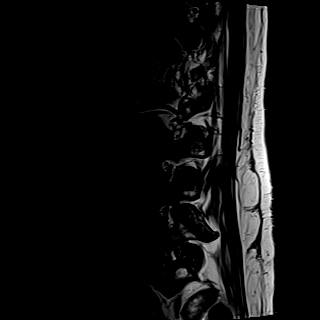
[im 16/16]
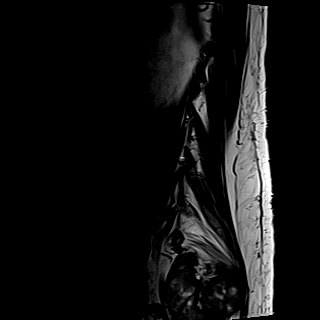

[Series 7: T2 · sagittal · 4.0mm · 0.81mm/px · 5 of 16 slices shown (1 of 2)]
[im 1/16]
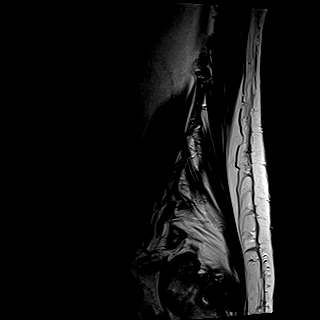
[im 4/16]
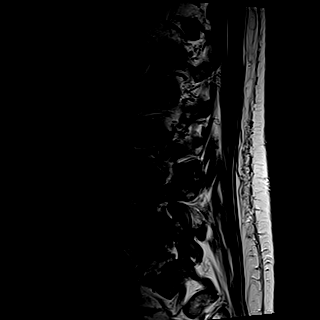
[im 8/16]
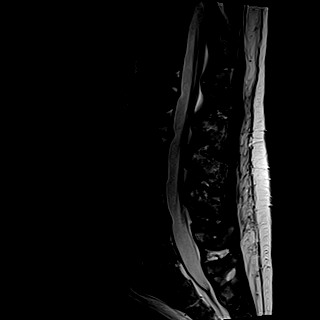
[im 12/16]
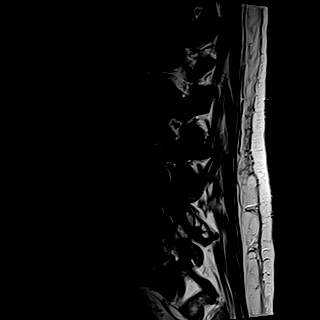
[im 16/16]
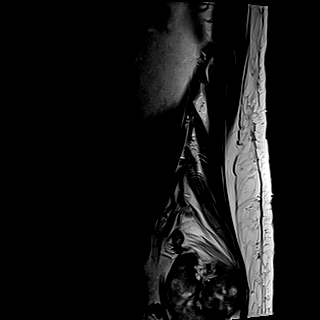

[Series 9: T2 · axial · 4.0mm · 0.62mm/px · z∈[-33,+197]mm · 10 of 50 slices shown (2 of 2)]
[im 4/50]
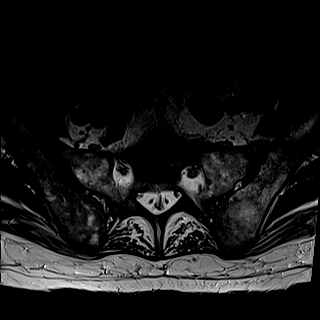
[im 7/50]
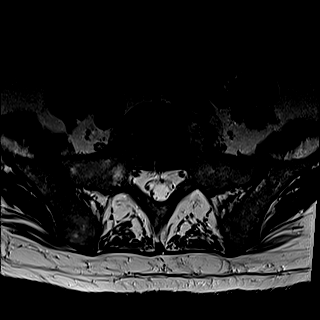
[im 10/50]
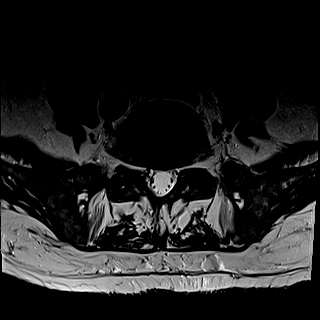
[im 17/50]
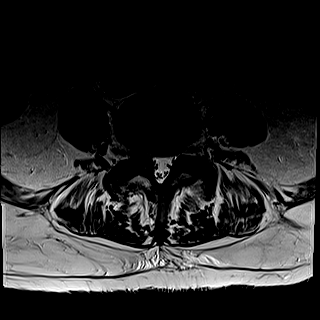
[im 23/50]
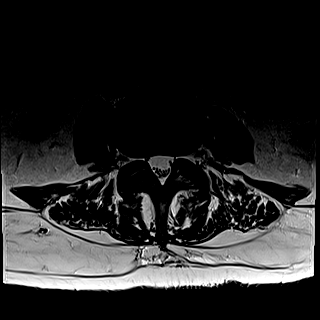
[im 27/50]
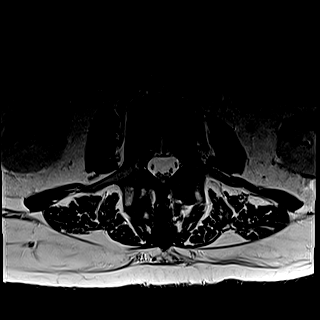
[im 30/50]
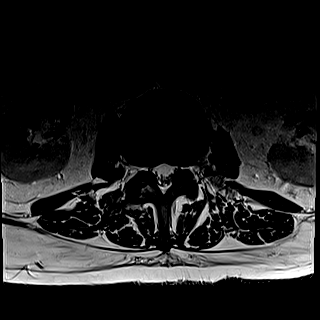
[im 36/50]
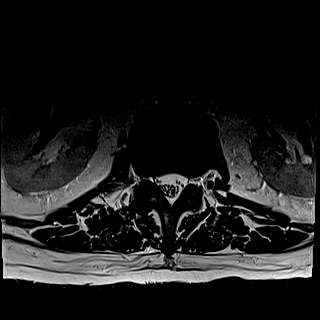
[im 43/50]
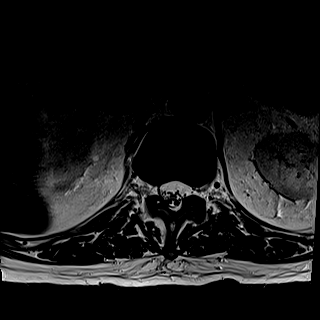
[im 50/50]
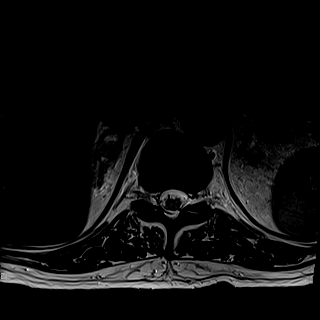

[Series 10: T1 · axial · 4.0mm · 0.39mm/px · z∈[-33,+197]mm · 10 of 50 slices shown (2 of 2)]
[im 4/50]
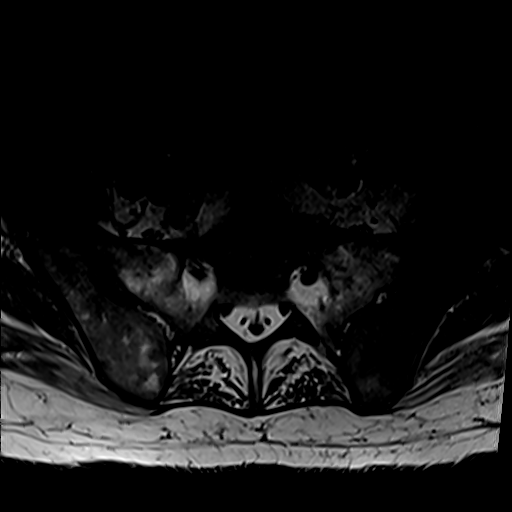
[im 7/50]
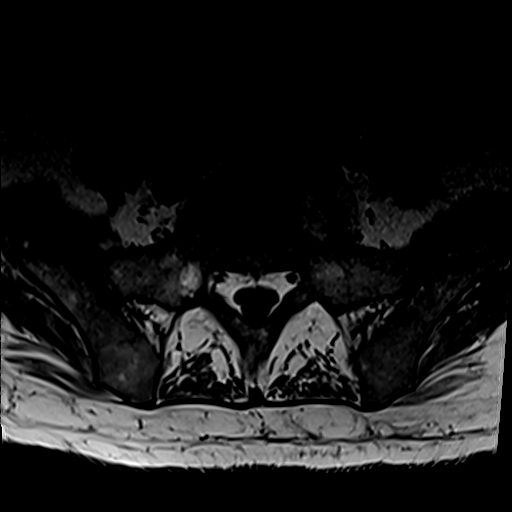
[im 10/50]
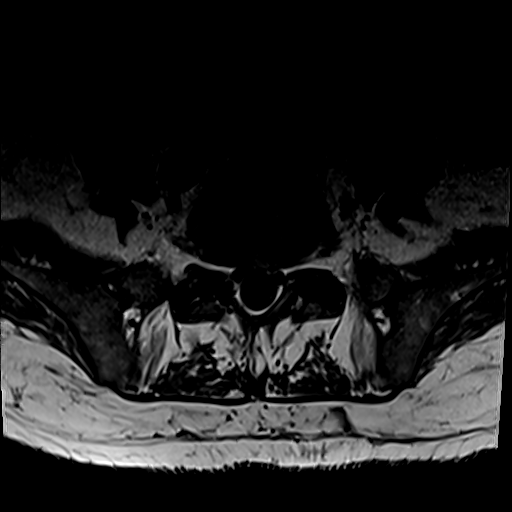
[im 17/50]
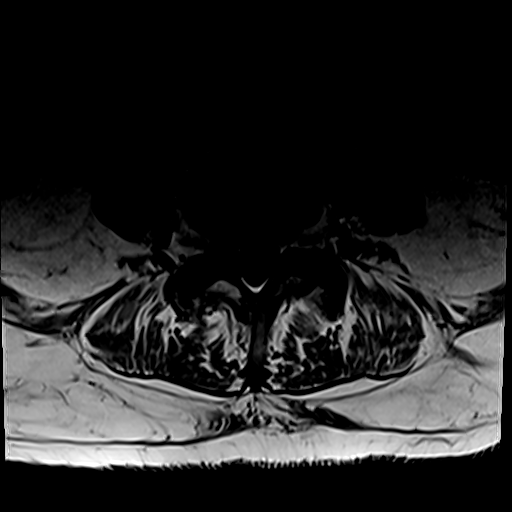
[im 23/50]
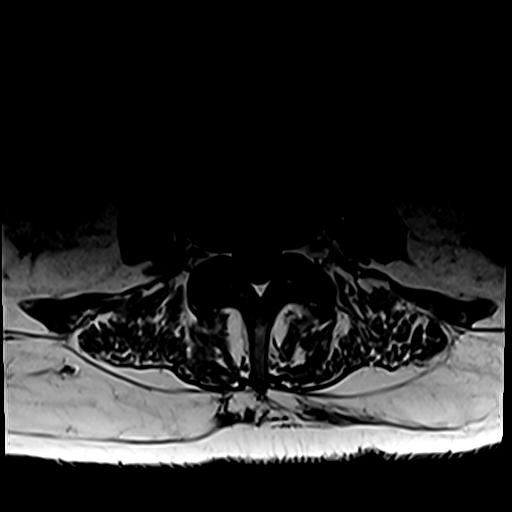
[im 27/50]
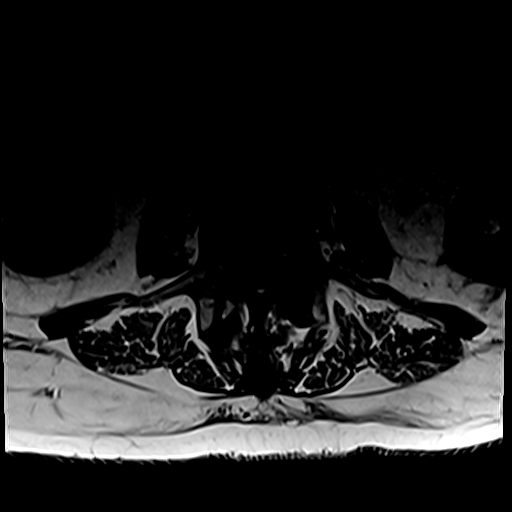
[im 30/50]
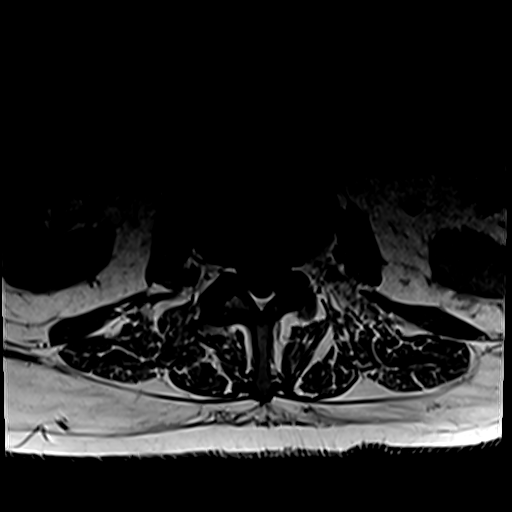
[im 36/50]
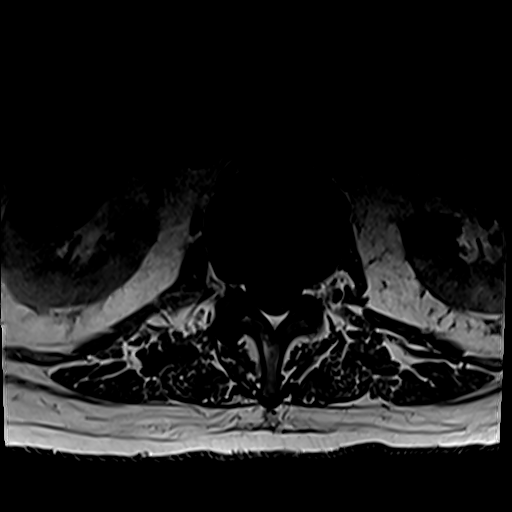
[im 43/50]
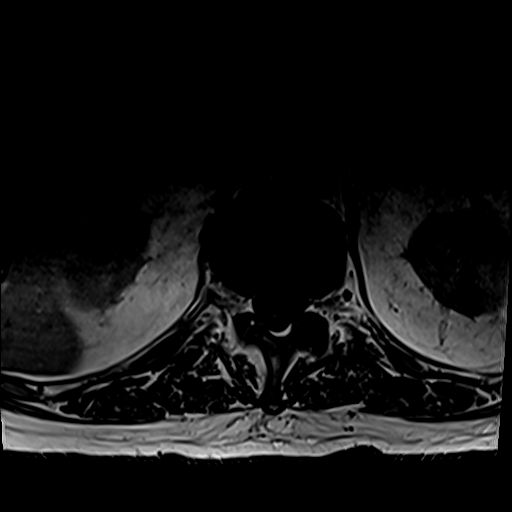
[im 50/50]
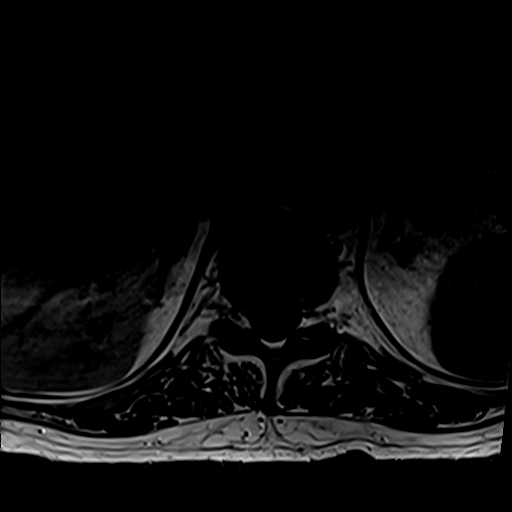

[31 of 48 positions shown; findings below may reference images not displayed]

FINDINGS: Segmentation:  Standard.

Alignment:  No significant listhesis.

Vertebrae: Vertebral body heights are maintained. There is patchy
STIR hyperintensity at T12 and L1 including involvement of the right
T12 pedicle with there may be some extraosseous extension. Patchy
STIR hyperintensity at L3 eccentric to the right. Partially imaged
possible STIR hyperintensity within the right sacrum. Overall
decreased T1 marrow signal.

Conus medullaris and cauda equina: Conus extends to the T12-L1
level. Conus and cauda equina appear normal.

Paraspinal and other soft tissues: Unremarkable.

Disc levels:

L1-L2: Disc bulge eccentric to the right. No canal or foraminal
stenosis.

L2-L3: Disc bulge with endplate osteophytes. Mild facet arthropathy
with ligamentum flavum infolding. No canal or foraminal stenosis.

L3-L4: Disc bulge with endplate osteophytes. Mild facet arthropathy
with ligamentum flavum infolding. No canal or foraminal stenosis.

L4-L5: Disc bulge with endplate osteophytic ridging. Marked facet
arthropathy with ligamentum flavum infolding. Minor canal stenosis.
Effacement of subarticular recesses. Mild right foraminal stenosis
with facet spurring contacting the exiting nerve root. Mild left
foraminal stenosis.

L5-S1: Marked disc height loss with probable partial bridging. Disc
bulge with endplate osteophytic ridging. Moderate facet arthropathy.
No canal stenosis. Minor foraminal stenosis.
IMPRESSION: Overall abnormal marrow signal with areas of more discrete
abnormality. Suspect metastatic disease.

Multilevel degenerative changes without high-grade stenosis.

## 2021-02-08 MED ORDER — LACTATED RINGERS IV BOLUS
1000.0000 mL | Freq: Once | INTRAVENOUS | Status: AC
  Administered 2021-02-08: 1000 mL via INTRAVENOUS

## 2021-02-08 MED ORDER — METOPROLOL TARTRATE 25 MG PO TABS
100.0000 mg | ORAL_TABLET | Freq: Two times a day (BID) | ORAL | Status: DC
  Administered 2021-02-08 – 2021-02-12 (×8): 100 mg via ORAL
  Filled 2021-02-08 (×8): qty 4

## 2021-02-08 MED ORDER — ACETAMINOPHEN 325 MG PO TABS
650.0000 mg | ORAL_TABLET | Freq: Four times a day (QID) | ORAL | Status: DC | PRN
  Administered 2021-02-10 – 2021-02-11 (×2): 650 mg via ORAL
  Filled 2021-02-08 (×2): qty 2

## 2021-02-08 MED ORDER — SODIUM CHLORIDE 0.9 % IV BOLUS
1000.0000 mL | Freq: Once | INTRAVENOUS | Status: AC
  Administered 2021-02-08: 1000 mL via INTRAVENOUS

## 2021-02-08 MED ORDER — RIVAROXABAN 20 MG PO TABS
20.0000 mg | ORAL_TABLET | Freq: Every day | ORAL | Status: DC
  Administered 2021-02-08 – 2021-02-12 (×3): 20 mg via ORAL
  Filled 2021-02-08 (×3): qty 1

## 2021-02-08 MED ORDER — ZOLEDRONIC ACID 4 MG/5ML IV CONC
4.0000 mg | Freq: Once | INTRAVENOUS | Status: AC
  Administered 2021-02-08: 4 mg via INTRAVENOUS
  Filled 2021-02-08: qty 5

## 2021-02-08 MED ORDER — FUROSEMIDE 10 MG/ML IJ SOLN
20.0000 mg | Freq: Every day | INTRAMUSCULAR | Status: DC
  Administered 2021-02-08 – 2021-02-12 (×5): 20 mg via INTRAVENOUS
  Filled 2021-02-08 (×5): qty 2

## 2021-02-08 MED ORDER — LACTATED RINGERS IV SOLN: INTRAVENOUS | Status: DC

## 2021-02-08 MED ORDER — CYCLOBENZAPRINE HCL 5 MG PO TABS: 5.0000 mg | ORAL_TABLET | Freq: Three times a day (TID) | ORAL | Status: DC | PRN

## 2021-02-08 MED ORDER — KETOROLAC TROMETHAMINE 15 MG/ML IJ SOLN
15.0000 mg | Freq: Four times a day (QID) | INTRAMUSCULAR | Status: DC | PRN
  Administered 2021-02-08 – 2021-02-12 (×5): 15 mg via INTRAVENOUS
  Filled 2021-02-08 (×5): qty 1

## 2021-02-08 MED ORDER — DOCUSATE SODIUM 100 MG PO CAPS
100.0000 mg | ORAL_CAPSULE | Freq: Two times a day (BID) | ORAL | Status: DC | PRN
  Administered 2021-02-09: 100 mg via ORAL
  Filled 2021-02-08: qty 1

## 2021-02-08 MED ORDER — INFLUENZA VAC A&B SA ADJ QUAD 0.5 ML IM PRSY
0.5000 mL | PREFILLED_SYRINGE | INTRAMUSCULAR | Status: AC
  Administered 2021-02-12: 0.5 mL via INTRAMUSCULAR
  Filled 2021-02-08 (×2): qty 0.5

## 2021-02-08 MED ORDER — ACETAMINOPHEN 650 MG RE SUPP: 650.0000 mg | Freq: Four times a day (QID) | RECTAL | Status: DC | PRN

## 2021-02-08 MED ORDER — ONDANSETRON HCL 4 MG PO TABS: 4.0000 mg | ORAL_TABLET | Freq: Four times a day (QID) | ORAL | Status: DC | PRN

## 2021-02-08 MED ORDER — ZOLEDRONIC ACID 4 MG/100ML IV SOLN
4.0000 mg | Freq: Once | INTRAVENOUS | Status: DC
  Filled 2021-02-08: qty 100

## 2021-02-08 MED ORDER — METOPROLOL TARTRATE 5 MG/5ML IV SOLN
5.0000 mg | Freq: Once | INTRAVENOUS | Status: AC
  Administered 2021-02-08: 5 mg via INTRAVENOUS
  Filled 2021-02-08: qty 5

## 2021-02-08 MED ORDER — ONDANSETRON HCL 4 MG/2ML IJ SOLN: 4.0000 mg | Freq: Four times a day (QID) | INTRAMUSCULAR | Status: DC | PRN

## 2021-02-08 MED ORDER — ZOLEDRONIC ACID 4 MG/100ML IV SOLN: 4.0000 mg | Freq: Once | INTRAVENOUS | Status: DC

## 2021-02-08 NOTE — ED Triage Notes (Signed)
Reports lower back pain that radiates up his L side, states he bent over to put something in the waste basket and 'felt a kink' x10-12 days ago. States he twisted and re-injured his back. Reports bilateral leg weakness, states getting up 'kills my back.' States he can walk a little bit.

## 2021-02-08 NOTE — H&P (Addendum)
History and Physical    Robert [name] ZOX:[id number] DOB: 12/11/1940 DOA: 02/08/2021  PCP: Farris Has, MD  Patient coming from: Home.  I have personally briefly reviewed patient's old medical records in [facility]  Chief Complaint: Severe back pain.  HPI: Robert [name] is a 80 y.o. 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  He denied numbness or pain radiation to his legs, fecal or urinary incontinence.  However, states that the pain has become excruciating at times.  He denied fever, chills, sore throat, night sweats, urinary, productive cough, dyspnea, wheezing or hemoptysis.  No chest pain, palpitations, diaphoresis, PND, orthopnea or pitting edema of the lower extremities.  Denied abdominal pain, nausea or vomiting, diarrhea, constipation, melena or hematochezia.  No dysuria, frequency or hematuria.  ED Course: Initial vital signs were 97.9 F, pulse 114, respirations 16, BP 114/89 mmHg O2 sat 98% on room air.  The patient received a 1000 mL NS bolus.  Lab work: His urinalysis showed ketonuria 5 mg/dL but was otherwise unremarkable.  CBC showed a white count of 38.2 with 92% neutrophils, hemoglobin 12.5 grams per deciliter platelets 356.  Lactic acid was 2.1 and chloride 97 mmol/L.  Calcium 13.4, glucose 124, BUN 28 and creatinine 1.26 mg/dL.  Imaging: MRI suspicious for metastatic disease to the lumbar spine.  Please see images and phonated report for further detail.  Review of Systems: As per HPI otherwise all other systems reviewed and are negative.  Past Medical History:  Diagnosis Date   A-fib (HCC)    HTN (hypertension)    Past Surgical History:  Procedure Laterality Date   DENTAL SURGERY     Social History  reports that  he has never smoked. He has quit using smokeless tobacco.  His smokeless tobacco use included chew. He reports current alcohol use. He reports that he does not use drugs.  No Known Allergies  Family History  Problem Relation Age of Onset   COPD Mother        Heavy smoker for 62 years   Hypertension Father        uncontrolled   Prior to Admission medications   Medication Sig Start Date End Date Taking? Authorizing Provider  docusate sodium (COLACE) 100 MG capsule Take 100 mg by mouth 2 (two) times daily as needed for mild constipation.   Yes [provider]  metoprolol tartrate (LOPRESSOR) 100 MG tablet Take 100 mg by mouth 2 (two) times daily. 07/26/18  Yes [provider]  rivaroxaban (XARELTO) 20 MG TABS tablet Take 1 tablet (20 mg total) by mouth daily with supper. 09/19/20  Yes Yates Decamp, MD   Physical Exam: Vitals:   02/08/21 1500 02/08/21 1530 02/08/21 1600 02/08/21 1637  BP: 129/83 127/86 106/88 138/85  Pulse: (!) 103   (!) 53  Resp: (!) 25 18 15 18   Temp:    97.9 F (36.6 C)  TempSrc:    Oral  SpO2: 99%   99%  Weight:      Height:       Constitutional: NAD, calm, comfortable Eyes: PERRL, lids and conjunctivae normal ENMT: Mucous membranes are dry.  Posterior pharynx clear of any exudate or lesions. Neck: normal, supple, no masses, no thyromegaly Respiratory: clear to auscultation bilaterally, no wheezing, no  crackles. Normal respiratory effort. No accessory muscle use.  Cardiovascular: Irregularly irregular in the 90s, no murmurs / rubs / gallops. No extremity edema. 2+ pedal pulses. No carotid bruits.  Abdomen: Obese, no distention.  Bowel sounds positive.  Soft, no tenderness, no masses palpated. No hepatosplenomegaly.  Musculoskeletal: Positive tenderness to the lumbar spine.  No clubbing / cyanosis.  Good ROM, no contractures. Normal muscle tone.  Skin: no acute rashes, lesions, ulcers on very limited dermatological examination. Neurologic: CN 2-12  grossly intact. Sensation intact, DTR normal. Strength 5/5 in all 4.  Psychiatric: Normal judgment and insight. Alert and oriented x 3. Normal mood. )  Labs on Admission: I have personally reviewed following labs and imaging studies  CBC: Recent Labs  Lab 02/08/21 1106  WBC 38.2*  NEUTROABS 34.8*  HGB 12.5*  HCT 39.8  MCV 89.0  PLT 356    Basic Metabolic Panel: Recent Labs  Lab 02/08/21 1106  NA 136  K 3.8  CL 97*  CO2 31  GLUCOSE 124*  BUN 28*  CREATININE 1.26*  CALCIUM 13.4*    GFR: Estimated Creatinine Clearance: 59.6 mL/min (A) (by C-G formula based on SCr of 1.26 mg/dL (H)).  Liver Function Tests: Recent Labs  Lab 02/08/21 1106  AST 32  ALT 25  ALKPHOS 136*  BILITOT 0.7  PROT 8.5*  ALBUMIN 3.6    Urine analysis:    Component Value Date/Time   COLORURINE YELLOW 02/08/2021 1018   APPEARANCEUR CLEAR 02/08/2021 1018   LABSPEC 1.016 02/08/2021 1018   PHURINE 5.0 02/08/2021 1018   GLUCOSEU NEGATIVE 02/08/2021 1018   HGBUR NEGATIVE 02/08/2021 1018   BILIRUBINUR NEGATIVE 02/08/2021 1018   KETONESUR 5 (A) 02/08/2021 1018   PROTEINUR NEGATIVE 02/08/2021 1018   NITRITE NEGATIVE 02/08/2021 1018   LEUKOCYTESUR NEGATIVE 02/08/2021 1018   Radiological Exams on Admission: DG Lumbar Spine Complete  Result Date: 02/08/2021 CLINICAL DATA:  Low back pain extending into the left flank for 3 days appeared EXAM: LUMBAR SPINE - COMPLETE 4+ VIEW COMPARISON:  None. FINDINGS: Alignment is within normal limits. Moderate disc height loss at L5-S1. Mild disc height loss at L1-L2 and L2-L3. advanced facet degenerative changes seen throughout, greatest at L4-L5. IMPRESSION: No acute abnormality of the lumbar spine. Multilevel degenerative changes as above. Electronically Signed   By: Acquanetta Belling M.D.   On: 02/08/2021 11:15   MR LUMBAR SPINE WO CONTRAST  Result Date: 02/08/2021 CLINICAL DATA:  Low back pain, cancer suspected; technologist note states low back pain and  bilateral leg weakness EXAM: MRI LUMBAR SPINE WITHOUT CONTRAST TECHNIQUE: Multiplanar, multisequence MR imaging of the lumbar spine was performed. No intravenous contrast was administered. COMPARISON:  None. FINDINGS: Segmentation:  Standard. Alignment:  No significant listhesis. Vertebrae: Vertebral body heights are maintained. There is patchy STIR hyperintensity at T12 and L1 including involvement of the right T12 pedicle with there may be some extraosseous extension. Patchy STIR hyperintensity at L3 eccentric to the right. Partially imaged possible STIR hyperintensity within the right sacrum. Overall decreased T1 marrow signal. Conus medullaris and cauda equina: Conus extends to the T12-L1 level. Conus and cauda equina appear normal. Paraspinal and other soft tissues: Unremarkable. Disc levels: L1-L2: Disc bulge eccentric to the right. No canal or foraminal stenosis. L2-L3: Disc bulge with endplate osteophytes. Mild facet arthropathy with ligamentum flavum infolding. No canal or foraminal stenosis. L3-L4: Disc bulge with endplate osteophytes. Mild facet arthropathy with ligamentum flavum infolding. No canal or foraminal stenosis. L4-L5: Disc bulge with endplate  osteophytic ridging. Marked facet arthropathy with ligamentum flavum infolding. Minor canal stenosis. Effacement of subarticular recesses. Mild right foraminal stenosis with facet spurring contacting the exiting nerve root. Mild left foraminal stenosis. L5-S1: Marked disc height loss with probable partial bridging. Disc bulge with endplate osteophytic ridging. Moderate facet arthropathy. No canal stenosis. Minor foraminal stenosis. IMPRESSION: Overall abnormal marrow signal with areas of more discrete abnormality. Suspect metastatic disease. Multilevel degenerative changes without high-grade stenosis. Electronically Signed   By: Guadlupe Spanish M.D.   On: 02/08/2021 12:38    EKG: Independently reviewed.   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 Observation/PCU. Continue IV fluids. Furosemide 20 mg IVP. Zoledronic acid 4 mg IVPB. Follow calcium level. Check magnesium and phosphorus level. Check PTH level. Further work-up depending on PTH result.  Active Problems:   Back pain Continue analgesics as needed. Declined to use opioids. 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    Cancer of lower gum (HCC) Follow-up with oncology.    Leukocytosis No fever, night sweats, rigors or chills. Defer antibiotics for now. Follow-up WBC. Follow blood cultures. Follow-up with hematology.    Permanent atrial fibrillation (HCC) CHA?DS?-VASc Score of at least 4. Continue Xarelto and metoprolol.    HTN (hypertension) Continue metoprolol 100 mg p.o. twice daily. Monitor BP and heart rate.    Normocytic anemia Monitor H&H.  Transfuse as needed.     DVT prophylaxis: Xarelto. Code Status:   Full code. Family Communication:   Disposition Plan:   Patient is from:  Home.  Anticipated DC to:  Home.  Anticipated DC date:  02/09/2021 or 02/10/2021.  Anticipated DC barriers: Clinical status.  Consults called:   Admission status:  Observation/PCU.   Severity of Illness:High severity after presenting with new back pain features and worse in intensity with suspicion metastasis of the spine.  He was incidentally found to have hypercalcemia and leukocytosis.Bobette Mo MD [facility]  How to contact the [facility] Attending or Consulting provider 7A - 7P or covering provider during after hours 7P -7A, for this patient?   Check the care team in [facility] and look for a) attending/consulting [facility] provider listed and b) the [facility] team listed Log into [email or web address] and use [facility]'s universal password to access. If you do not have the password, please contact the hospital operator. Locate the [facility] provider you are looking for under [facility] and page to a number that you can be  directly reached. If you still have difficulty reaching the provider, please page the [facility] (Director on Call) for the Hospitalists listed on amion for assistance.  02/08/2021, 5:34 PM   This document was prepared using Dragon voice recognition software and may contain some unintended transcription errors.

## 2021-02-08 NOTE — ED Provider Notes (Signed)
[facility] DEPT Provider Note   CSN: [id number] Arrival date & time: 02/08/21  0737     History Chief Complaint  Patient presents with   Back Pain    Robert [name] is a 80 y.o. male.  Patient states that he bent over and started having some severe back pain and he feels weakness all over.  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  The history is provided by the patient and medical records. No language interpreter was used.  Back Pain Location:  Lumbar spine Quality:  Aching Radiates to:  Does not radiate Pain severity:  Moderate Pain is:  Same all the time Onset quality:  Sudden Timing:  Constant Progression:  Worsening Chronicity:  New Context: not emotional stress   Relieved by:  Nothing Worsened by:  Nothing Associated symptoms: no abdominal pain, no chest pain and no headaches       Past Medical History:  Diagnosis Date   A-fib ([place])    HTN (hypertension)     Patient Active Problem List   Diagnosis Date Noted   Hypercalcemia 02/08/2021   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/2020   Permanent atrial fibrillation ([place]) 10/22/2018   Non-rheumatic mitral regurgitation 10/22/2018   Obesity (BMI 30-39.9) 10/22/2018    Past Surgical History:  Procedure Laterality Date   DENTAL SURGERY         Family History  Problem Relation Age of Onset   COPD Mother        Heavy smoker for 89 years   Hypertension Father        uncontrolled    Social History   Tobacco Use   Smoking status: Never   Smokeless tobacco: Former    Types: Nurse, children's Use: Former  Substance Use Topics   Alcohol use: Yes    Comment: occ   Drug use: Never    Home Medications Prior to Admission medications   Medication Sig Start Date End Date Taking? Authorizing Provider  metoprolol tartrate (LOPRESSOR) 100 MG tablet Take 100 mg by mouth 2 (two) times daily.  07/26/18   [provider]  rivaroxaban (XARELTO) 20 MG TABS tablet Take 1 tablet (20 mg total) by mouth daily with supper. 09/19/20   Adrian Prows, MD    Allergies    Patient has no known allergies.  Review of Systems   Review of Systems  Constitutional:  Negative for appetite change and fatigue.  HENT:  Negative for congestion, ear discharge and sinus pressure.   Eyes:  Negative for discharge.  Respiratory:  Negative for cough.   Cardiovascular:  Negative for chest pain.  Gastrointestinal:  Negative for abdominal pain and diarrhea.  Genitourinary:  Negative for frequency and hematuria.  Musculoskeletal:  Positive for back pain.  Skin:  Negative for rash.  Neurological:  Negative for seizures and headaches.  Psychiatric/Behavioral:  Negative for hallucinations.    Physical Exam Updated Vital Signs BP 118/75   Pulse 100   Temp 97.9 F (36.6 C) (Oral)   Resp 20   Ht 6' (1.829 m)   Wt 98 kg   SpO2 96%   BMI 29.30 kg/m   Physical Exam Vitals and nursing note reviewed.  Constitutional:      Appearance: He is well-developed.  HENT:     Head: Normocephalic.  Eyes:     General: No scleral icterus.  Conjunctiva/sclera: Conjunctivae normal.  Neck:     Thyroid: No thyromegaly.  Cardiovascular:     Rate and Rhythm: Normal rate and regular rhythm.     Heart sounds: No murmur heard.   No friction rub. No gallop.  Pulmonary:     Breath sounds: No stridor. No wheezing or rales.  Chest:     Chest wall: No tenderness.  Abdominal:     General: There is no distension.     Tenderness: There is no abdominal tenderness. There is no rebound.  Musculoskeletal:     Cervical back: Neck supple.     Comments: Tenderness to lumbar spine.  Lymphadenopathy:     Cervical: No cervical adenopathy.  Skin:    Findings: No erythema or rash.  Neurological:     Mental Status: He is alert and oriented to person, place, and time.     Motor: No abnormal muscle tone.     Coordination:  Coordination normal.  Psychiatric:        Behavior: Behavior normal.    ED Results / Procedures / Treatments   Labs (all labs ordered are listed, but only abnormal results are displayed) Labs Reviewed  CBC WITH DIFFERENTIAL/PLATELET - Abnormal; Notable for the following components:      Result Value   WBC 38.2 (*)    Hemoglobin 12.5 (*)    Neutro Abs 34.8 (*)    Lymphs Abs 0.5 (*)    Monocytes Absolute 1.8 (*)    Abs Immature Granulocytes 0.92 (*)    All other components within normal limits  COMPREHENSIVE METABOLIC PANEL - Abnormal; Notable for the following components:   Chloride 97 (*)    Glucose, Bld 124 (*)    BUN 28 (*)    Creatinine, Ser 1.26 (*)    Calcium 13.4 (*)    Total Protein 8.5 (*)    Alkaline Phosphatase 136 (*)    GFR, Estimated 59 (*)    All other components within normal limits  URINALYSIS, ROUTINE W REFLEX MICROSCOPIC - Abnormal; Notable for the following components:   Ketones, ur 5 (*)    All other components within normal limits  CULTURE, BLOOD (ROUTINE X 2)  CULTURE, BLOOD (ROUTINE X 2)  LACTIC ACID, PLASMA    EKG None  Radiology DG Lumbar Spine Complete  Result Date: 02/08/2021 CLINICAL DATA:  Low back pain extending into the left flank for 3 days appeared EXAM: LUMBAR SPINE - COMPLETE 4+ VIEW COMPARISON:  None. FINDINGS: Alignment is within normal limits. Moderate disc height loss at L5-S1. Mild disc height loss at L1-L2 and L2-L3. advanced facet degenerative changes seen throughout, greatest at L4-L5. IMPRESSION: No acute abnormality of the lumbar spine. Multilevel degenerative changes as above. Electronically Signed   By: Miachel Roux M.D.   On: 02/08/2021 11:15   MR LUMBAR SPINE WO CONTRAST  Result Date: 02/08/2021 CLINICAL DATA:  Low back pain, cancer suspected; technologist note states low back pain and bilateral leg weakness EXAM: MRI LUMBAR SPINE WITHOUT CONTRAST TECHNIQUE: Multiplanar, multisequence MR imaging of the lumbar spine was  performed. No intravenous contrast was administered. COMPARISON:  None. FINDINGS: Segmentation:  Standard. Alignment:  No significant listhesis. Vertebrae: Vertebral body heights are maintained. There is patchy STIR hyperintensity at T12 and L1 including involvement of the right T12 pedicle with there may be some extraosseous extension. Patchy STIR hyperintensity at L3 eccentric to the right. Partially imaged possible STIR hyperintensity within the right sacrum. Overall decreased T1 marrow signal. Conus medullaris and  cauda equina: Conus extends to the T12-L1 level. Conus and cauda equina appear normal. Paraspinal and other soft tissues: Unremarkable. Disc levels: L1-L2: Disc bulge eccentric to the right. No canal or foraminal stenosis. L2-L3: Disc bulge with endplate osteophytes. Mild facet arthropathy with ligamentum flavum infolding. No canal or foraminal stenosis. L3-L4: Disc bulge with endplate osteophytes. Mild facet arthropathy with ligamentum flavum infolding. No canal or foraminal stenosis. L4-L5: Disc bulge with endplate osteophytic ridging. Marked facet arthropathy with ligamentum flavum infolding. Minor canal stenosis. Effacement of subarticular recesses. Mild right foraminal stenosis with facet spurring contacting the exiting nerve root. Mild left foraminal stenosis. L5-S1: Marked disc height loss with probable partial bridging. Disc bulge with endplate osteophytic ridging. Moderate facet arthropathy. No canal stenosis. Minor foraminal stenosis. IMPRESSION: Overall abnormal marrow signal with areas of more discrete abnormality. Suspect metastatic disease. Multilevel degenerative changes without high-grade stenosis. Electronically Signed   By: Macy Mis M.D.   On: 02/08/2021 12:38    Procedures Procedures   Medications Ordered in ED Medications  sodium chloride 0.9 % bolus 1,000 mL (has no administration in time range)  Zoledronic Acid (ZOMETA) IVPB 4 mg (has no administration in time range)   sodium chloride 0.9 % bolus 1,000 mL (1,000 mLs Intravenous New Bag/Given 02/08/21 1101)    ED Course  I have reviewed the triage vital signs and the nursing notes.  Pertinent labs & imaging results that were available during my care of the patient were reviewed by me and considered in my medical decision making (see chart for details).   CRITICAL CARE Performed by: Milton Ferguson Total critical care time: 40 minutes Critical care time was exclusive of separately billable procedures and treating other patients. Critical care was necessary to treat or prevent imminent or life-threatening deterioration. Critical care was time spent personally by me on the following activities: development of treatment plan with patient and/or surrogate as well as nursing, discussions with consultants, evaluation of patient's response to treatment, examination of patient, obtaining history from patient or surrogate, ordering and performing treatments and interventions, ordering and review of laboratory studies, ordering and review of radiographic studies, pulse oximetry and re-evaluation of patient's condition.  MDM Rules/Calculators/A&P                           Final Clinical Impression(s) / ED Diagnoses Final diagnoses:  Hypercalcemia  Patient's MRI suggest that he has metastatic disease in his spine.  Patient with hypercalcemia and leukocytosis.  He will be admitted to medicine  Rx / DC Orders ED Discharge Orders     None        Milton Ferguson, MD 02/08/21 1347

## 2021-02-08 NOTE — ED Notes (Signed)
Patient transported to MRI 

## 2021-02-08 NOTE — ED Provider Notes (Signed)
MSE was initiated and I personally evaluated the patient and placed orders (if any) at  10:19 AM on February 08, 2021.  The patient appears stable so that the remainder of the MSE may be completed by another provider.   MSE note.  Patient complains of weakness back pain difficulty ambulating.  Patient has a history of squamous cell cancer that was treated surgically this year.  He states he feels like he is dehydrated.  Patient alert oriented x4 lungs clear abdomen soft labs and x-rays ordered   Milton Ferguson, MD 02/08/21 1020

## 2021-02-09 ENCOUNTER — Inpatient Hospital Stay (HOSPITAL_COMMUNITY): Payer: Medicare Other

## 2021-02-09 DIAGNOSIS — D72828 Other elevated white blood cell count: Secondary | ICD-10-CM | POA: Diagnosis present

## 2021-02-09 DIAGNOSIS — Z87891 Personal history of nicotine dependence: Secondary | ICD-10-CM | POA: Diagnosis not present

## 2021-02-09 DIAGNOSIS — D6489 Other specified anemias: Secondary | ICD-10-CM | POA: Diagnosis present

## 2021-02-09 DIAGNOSIS — C44529 Squamous cell carcinoma of skin of other part of trunk: Secondary | ICD-10-CM | POA: Diagnosis not present

## 2021-02-09 DIAGNOSIS — C069 Malignant neoplasm of mouth, unspecified: Secondary | ICD-10-CM | POA: Diagnosis not present

## 2021-02-09 DIAGNOSIS — R911 Solitary pulmonary nodule: Secondary | ICD-10-CM | POA: Diagnosis not present

## 2021-02-09 DIAGNOSIS — M899 Disorder of bone, unspecified: Secondary | ICD-10-CM | POA: Diagnosis not present

## 2021-02-09 DIAGNOSIS — I700 Atherosclerosis of aorta: Secondary | ICD-10-CM | POA: Diagnosis not present

## 2021-02-09 DIAGNOSIS — R590 Localized enlarged lymph nodes: Secondary | ICD-10-CM | POA: Diagnosis not present

## 2021-02-09 DIAGNOSIS — K6389 Other specified diseases of intestine: Secondary | ICD-10-CM | POA: Diagnosis not present

## 2021-02-09 DIAGNOSIS — G319 Degenerative disease of nervous system, unspecified: Secondary | ICD-10-CM | POA: Diagnosis not present

## 2021-02-09 DIAGNOSIS — Z23 Encounter for immunization: Secondary | ICD-10-CM | POA: Diagnosis not present

## 2021-02-09 DIAGNOSIS — R52 Pain, unspecified: Secondary | ICD-10-CM | POA: Diagnosis present

## 2021-02-09 DIAGNOSIS — I10 Essential (primary) hypertension: Secondary | ICD-10-CM | POA: Diagnosis present

## 2021-02-09 DIAGNOSIS — Z79899 Other long term (current) drug therapy: Secondary | ICD-10-CM | POA: Diagnosis not present

## 2021-02-09 DIAGNOSIS — C7951 Secondary malignant neoplasm of bone: Secondary | ICD-10-CM | POA: Diagnosis present

## 2021-02-09 DIAGNOSIS — E049 Nontoxic goiter, unspecified: Secondary | ICD-10-CM | POA: Diagnosis not present

## 2021-02-09 DIAGNOSIS — Z20822 Contact with and (suspected) exposure to covid-19: Secondary | ICD-10-CM | POA: Diagnosis present

## 2021-02-09 DIAGNOSIS — D734 Cyst of spleen: Secondary | ICD-10-CM | POA: Diagnosis not present

## 2021-02-09 DIAGNOSIS — I9589 Other hypotension: Secondary | ICD-10-CM | POA: Diagnosis not present

## 2021-02-09 DIAGNOSIS — Z923 Personal history of irradiation: Secondary | ICD-10-CM | POA: Diagnosis not present

## 2021-02-09 DIAGNOSIS — Z7901 Long term (current) use of anticoagulants: Secondary | ICD-10-CM | POA: Diagnosis not present

## 2021-02-09 DIAGNOSIS — I4821 Permanent atrial fibrillation: Secondary | ICD-10-CM | POA: Diagnosis present

## 2021-02-09 DIAGNOSIS — D7389 Other diseases of spleen: Secondary | ICD-10-CM | POA: Diagnosis not present

## 2021-02-09 DIAGNOSIS — C031 Malignant neoplasm of lower gum: Secondary | ICD-10-CM | POA: Diagnosis present

## 2021-02-09 DIAGNOSIS — C3492 Malignant neoplasm of unspecified part of left bronchus or lung: Secondary | ICD-10-CM | POA: Diagnosis present

## 2021-02-09 DIAGNOSIS — K7689 Other specified diseases of liver: Secondary | ICD-10-CM | POA: Diagnosis not present

## 2021-02-09 DIAGNOSIS — K828 Other specified diseases of gallbladder: Secondary | ICD-10-CM | POA: Diagnosis not present

## 2021-02-09 LAB — RENAL FUNCTION PANEL
Albumin: 2.8 g/dL — ABNORMAL LOW (ref 3.5–5.0)
Anion gap: 5 (ref 5–15)
BUN: 16 mg/dL (ref 8–23)
CO2: 34 mmol/L — ABNORMAL HIGH (ref 22–32)
Calcium: 11.6 mg/dL — ABNORMAL HIGH (ref 8.9–10.3)
Chloride: 98 mmol/L (ref 98–111)
Creatinine, Ser: 0.96 mg/dL (ref 0.61–1.24)
GFR, Estimated: >60 mL/min (ref >60)
Glucose, Bld: 93 mg/dL (ref 70–99)
Phosphorus: 2.4 mg/dL — ABNORMAL LOW (ref 2.5–4.6)
Potassium: 3.6 mmol/L (ref 3.5–5.1)
Sodium: 137 mmol/L (ref 135–145)

## 2021-02-09 LAB — CBC WITH DIFFERENTIAL/PLATELET
Abs Immature Granulocytes: 0.36 10*3/uL — ABNORMAL HIGH (ref 0.00–0.07)
Basophils Absolute: 0.1 10*3/uL (ref 0.0–0.1)
Basophils Relative: 0 %
Eosinophils Absolute: 0.2 10*3/uL (ref 0.0–0.5)
Eosinophils Relative: 1 %
HCT: 35.4 % — ABNORMAL LOW (ref 39.0–52.0)
Hemoglobin: 10.9 g/dL — ABNORMAL LOW (ref 13.0–17.0)
Immature Granulocytes: 2 %
Lymphocytes Relative: 2 %
Lymphs Abs: 0.4 10*3/uL — ABNORMAL LOW (ref 0.7–4.0)
MCH: 27.7 pg (ref 26.0–34.0)
MCHC: 30.8 g/dL (ref 30.0–36.0)
MCV: 90.1 fL (ref 80.0–100.0)
Monocytes Absolute: 1 10*3/uL (ref 0.1–1.0)
Monocytes Relative: 5 %
Neutro Abs: 20.9 10*3/uL — ABNORMAL HIGH (ref 1.7–7.7)
Neutrophils Relative %: 90 %
Platelets: 235 10*3/uL (ref 150–400)
RBC: 3.93 MIL/uL — ABNORMAL LOW (ref 4.22–5.81)
RDW: 13.4 % (ref 11.5–15.5)
WBC: 22.9 10*3/uL — ABNORMAL HIGH (ref 4.0–10.5)
nRBC: 0 % (ref 0.0–0.2)

## 2021-02-09 IMAGING — CT CT NECK W/ CM
3 of 4 series · 13 of 33 positions shown, 16 images · IV contrast (omnipaque)
Comparison: [DATE] CT

CLINICAL DATA: Metastatic disease evaluation, squamous cell
carcinoma of the mouth with surgery and radiation therapy calming
infra progressively worsening back pain

EXAM:
CT NECK WITH CONTRAST
TECHNIQUE: Multidetector CT imaging of the neck was performed using the
standard protocol following the bolus administration of intravenous
contrast.
CONTRAST:  100mL OMNIPAQUE IOHEXOL 350 MG/ML SOLN

[Series 4: axial neck · axial · 0.65mm/px · z∈[+1096,+1288]mm · 5 of 145 slices shown, 7 images]
[im 25/145  soft-tissue]
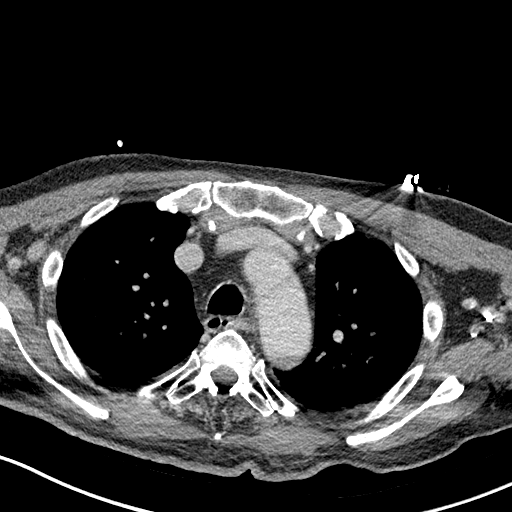
[im 25/145  bone]
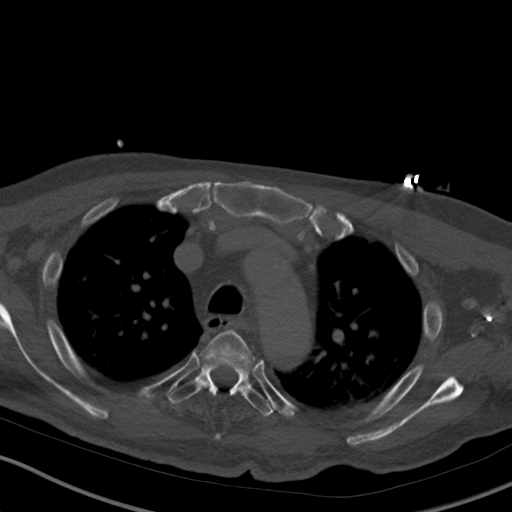
[im 49/145  bone]
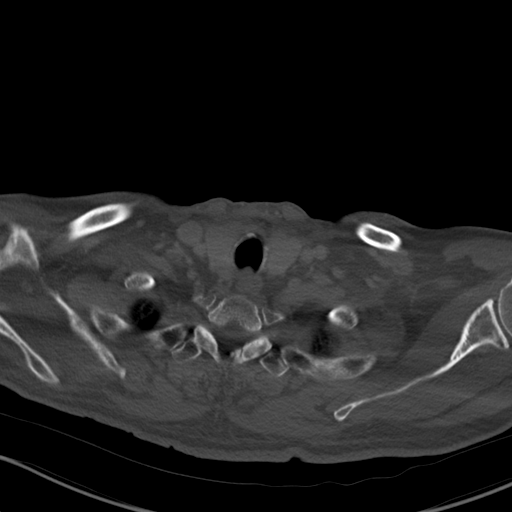
[im 73/145  bone]
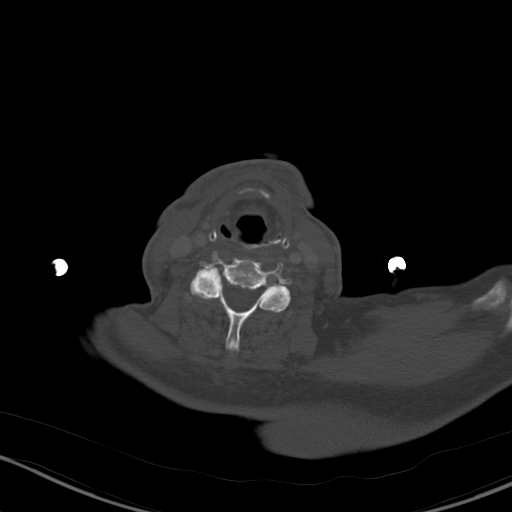
[im 97/145  bone]
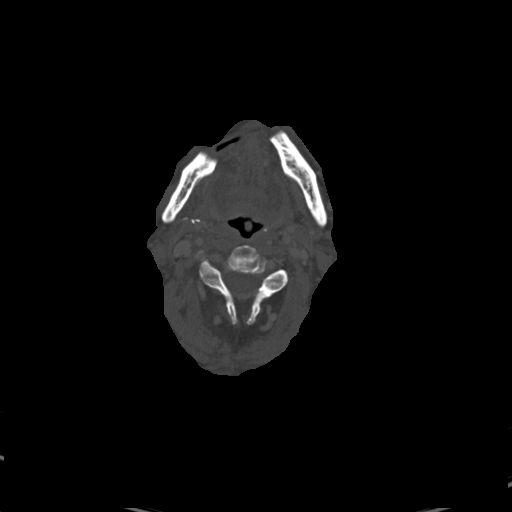
[im 121/145  soft-tissue]
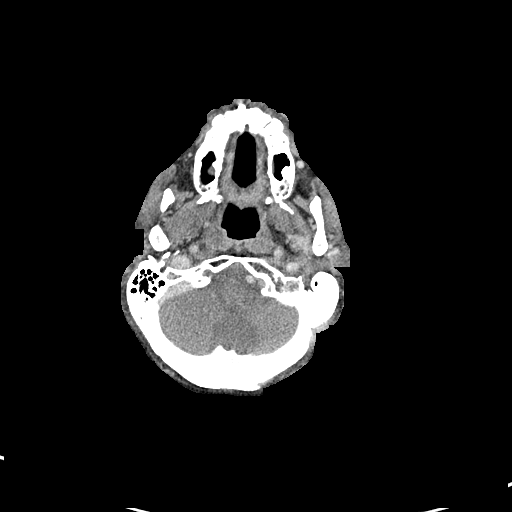
[im 121/145  bone]
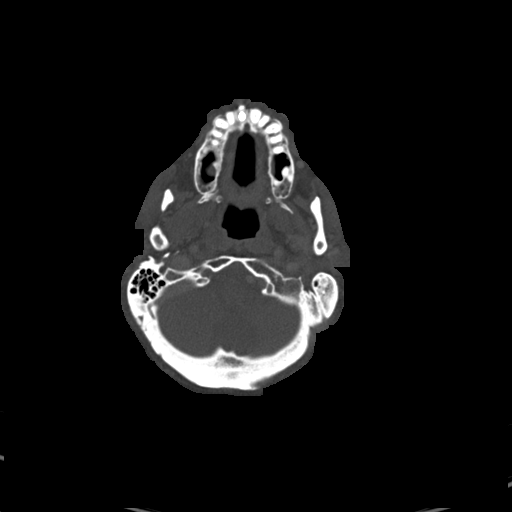

[Series 8: cor neck · coronal · 0.57mm/px · 3 of 110 slices shown]
[im 22/110  bone]
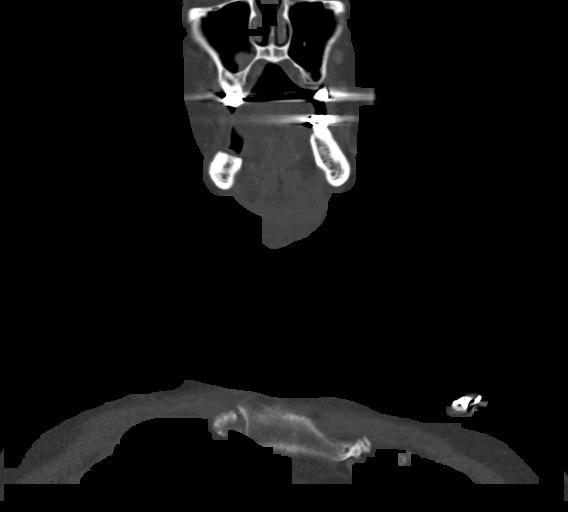
[im 44/110  bone]
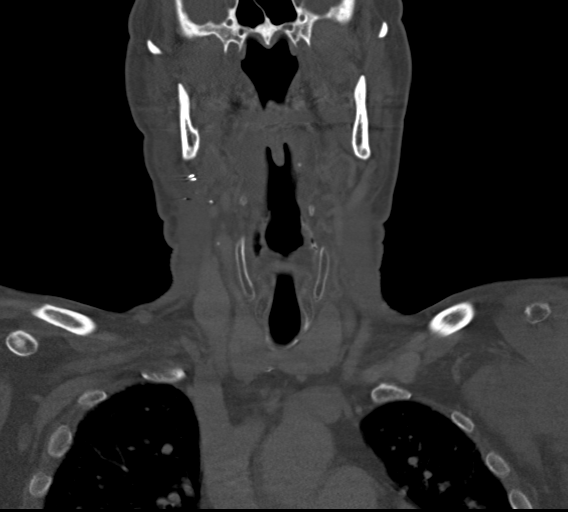
[im 66/110  bone]
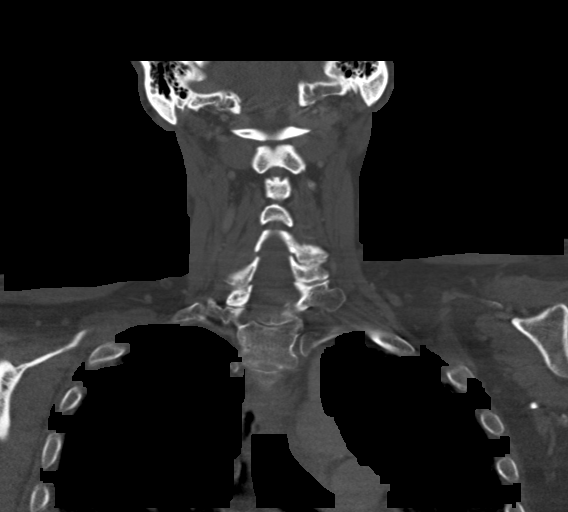

[Series 9: sag neck · sagittal · 0.46mm/px · 5 of 101 slices shown, 6 images]
[im 34/101  bone]
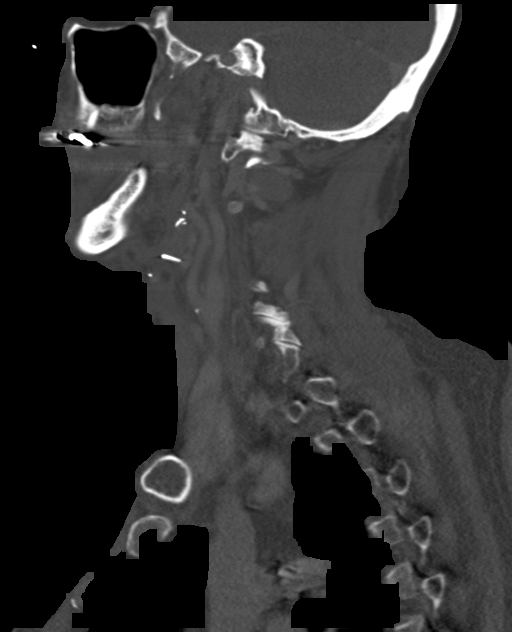
[im 42/101  bone]
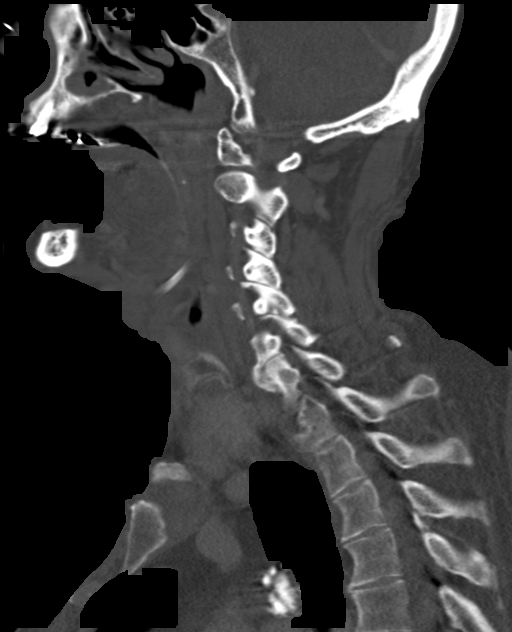
[im 51/101  soft-tissue]
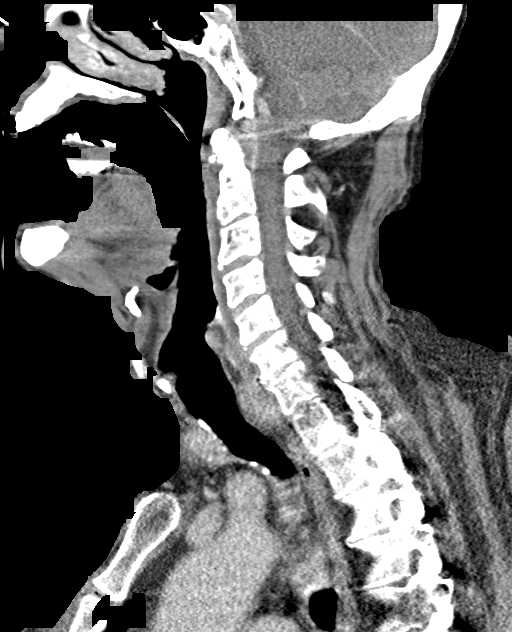
[im 51/101  bone]
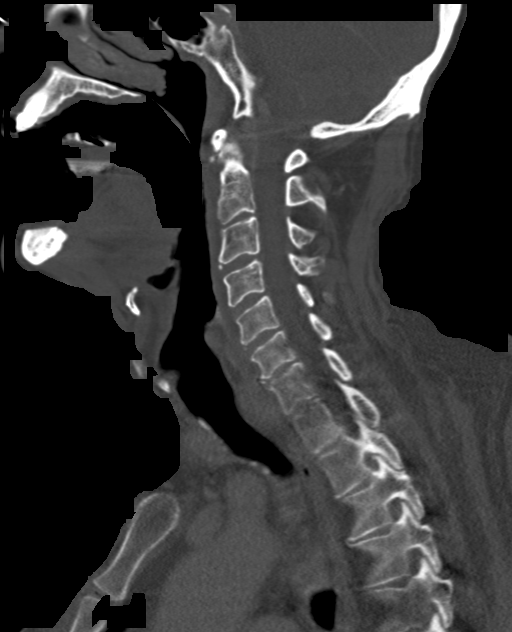
[im 59/101  bone]
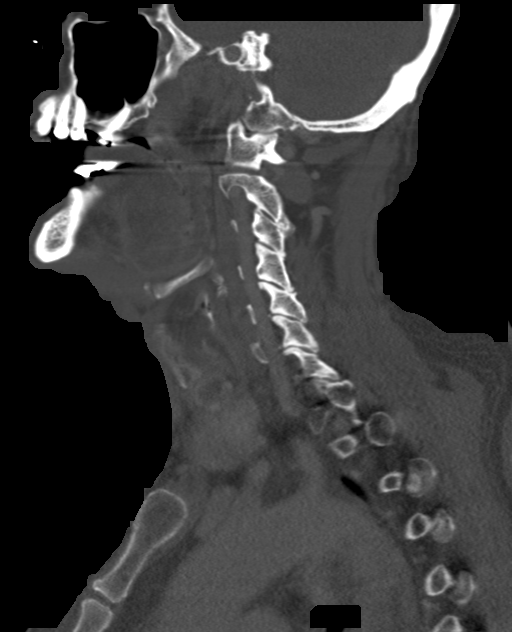
[im 67/101  bone]
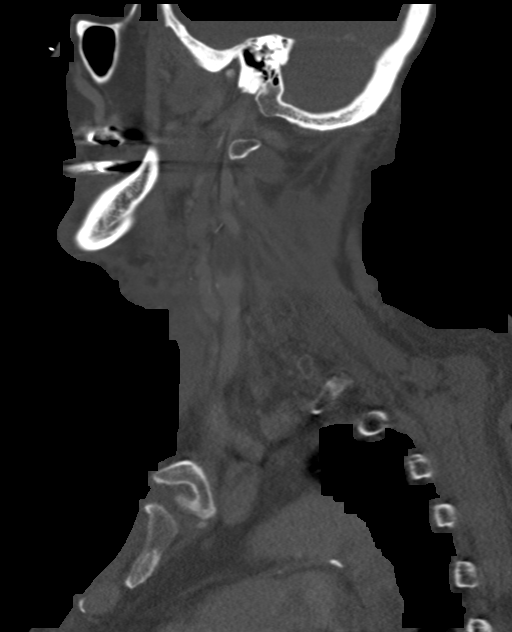

[13 of 33 positions shown; findings below may reference images not displayed]

FINDINGS: Pharynx and larynx: Status post resection and treatment of a
previously noted enhancing mass arising from the buccal surface of
the right lower lip, with focal fat, likely related to
reconstruction. No definite residual enhancing mass is seen.

Salivary glands: No inflammation, mass, or stone.

Thyroid: Unchanged mild enlargement of the thyroid. A previously
noted central hypodense lesion is not appreciated on the current
exam.

Lymph nodes: Status post bilateral neck dissection. No cervical
lymphadenopathy. For mediastinal lymph nodes, please see same-day CT
chest

Vascular: Negative.

Limited intracranial: Negative.

Visualized orbits: Negative.

Mastoids and visualized paranasal sinuses: Mucosal thickening in the
right maxillary sinus. Otherwise negative.

Skeleton: No acute osseous abnormality in the cervical spine. Status
post partial resection of the mandible

Upper chest: Please see same-day CT chest

Other: Skin thickening overlying the neck, likely sequela of prior
radiation.
IMPRESSION: 1. Status post resection of and radiation therapy to an enhancing
mass arising from the buccal surface of the right lower lip, with no
residual enhancing mass seen. Reconstructive changes. No evidence of
local recurrence.
2. Status post bilateral neck dissection. No cervical
lymphadenopathy.
3. For findings in the chest, including mediastinal lymph nodes,
please see same-day CT chest.

## 2021-02-09 IMAGING — CT CT ABD-PELV W/ CM
2 of 5 series · 11 of 36 positions shown, 13 images · IV contrast (OMNIPAQUE 350)
Comparison: [DATE]

CLINICAL DATA: Cancer of unknown primary, staging; Metastatic
disease evaluation

EXAM:
CT CHEST, ABDOMEN, AND PELVIS WITH CONTRAST
TECHNIQUE: Multidetector CT imaging of the chest, abdomen and pelvis was
performed following the standard protocol during bolus
administration of intravenous contrast.
CONTRAST:  100mL OMNIPAQUE IOHEXOL 350 MG/ML SOLN

[Series 3: cap with · axial · 0.88mm/px · z∈[+544,+1118]mm · 8 of 141 slices shown, 10 images]
[im 13/141  mediastinal]
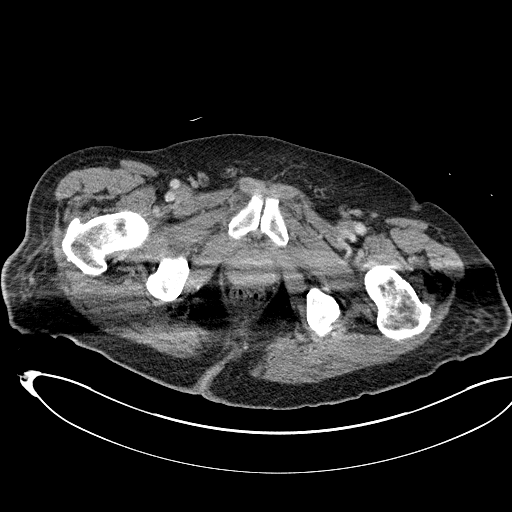
[im 13/141  lung]
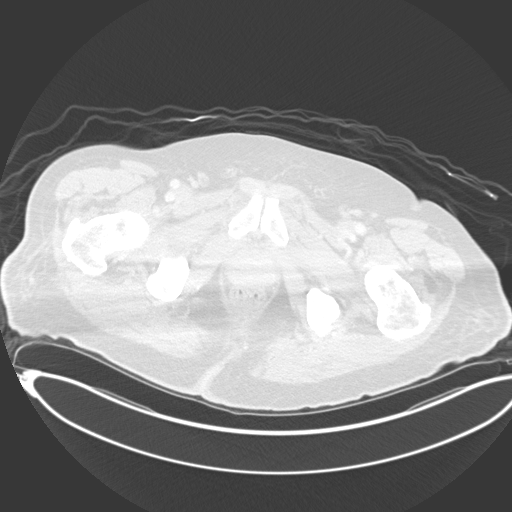
[im 26/141  lung]
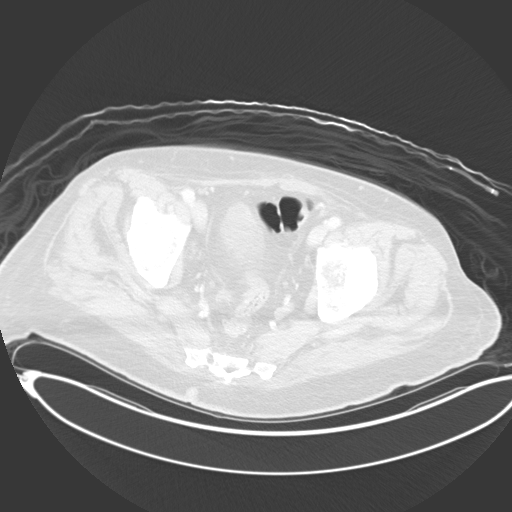
[im 51/141  lung]
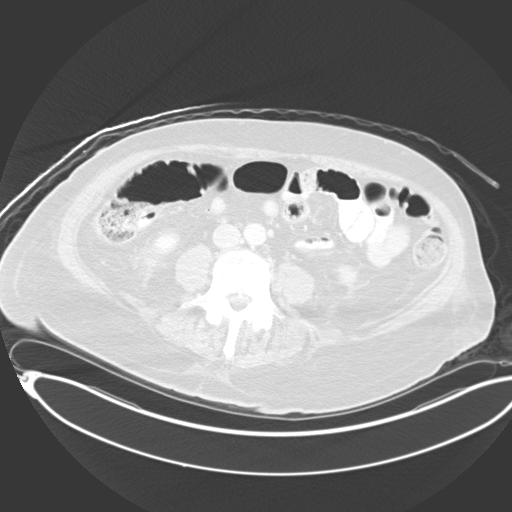
[im 64/141  lung]
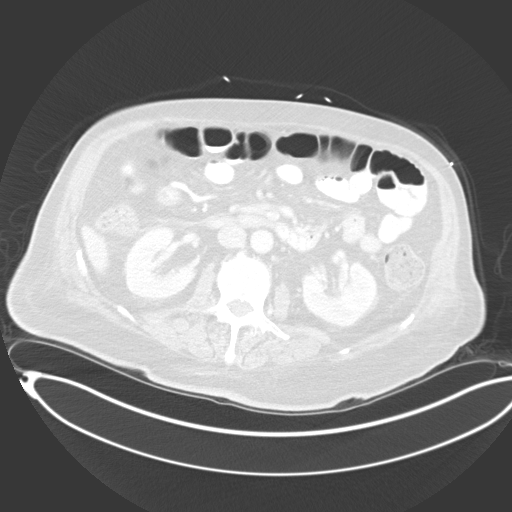
[im 77/141  mediastinal]
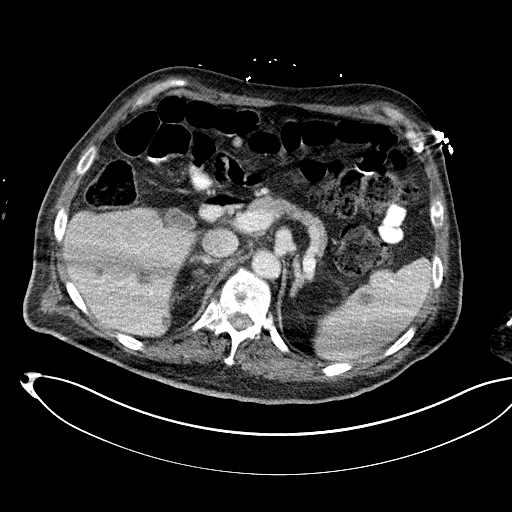
[im 77/141  lung]
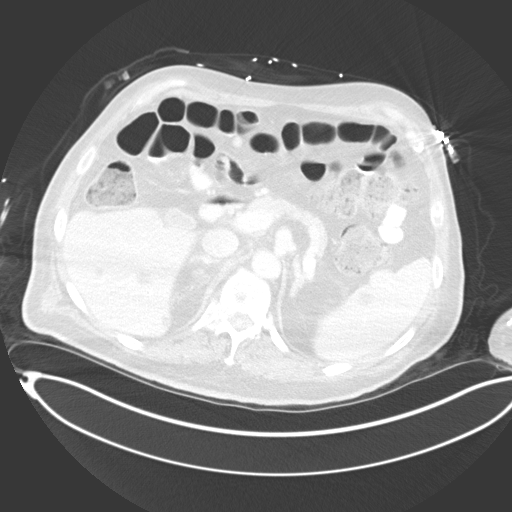
[im 90/141  lung]
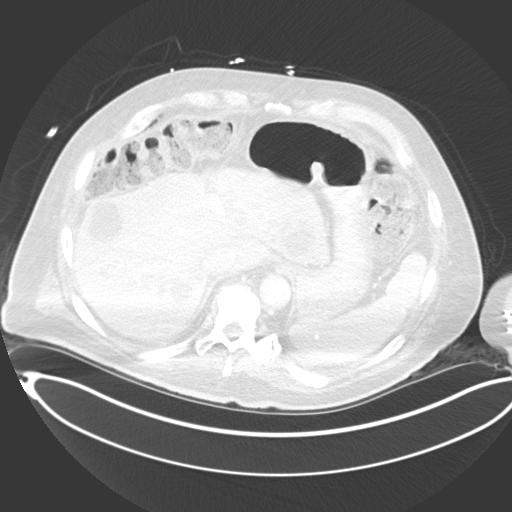
[im 115/141  lung]
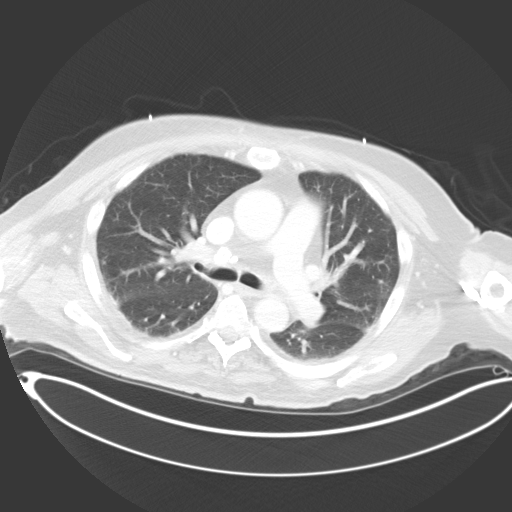
[im 128/141  lung]
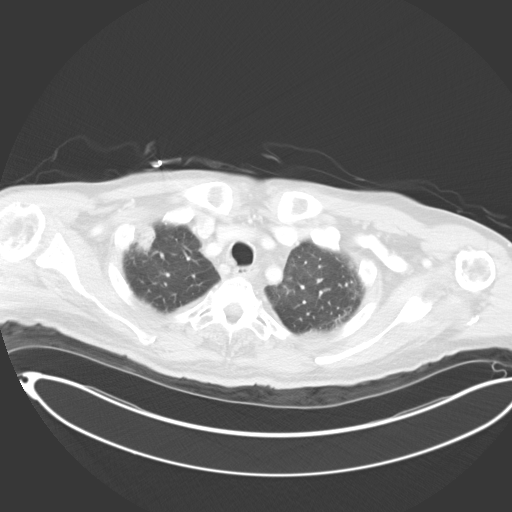

[Series 6: coronals · coronal · 0.99mm/px · 3 of 426 slices shown]
[im 86/426  lung]
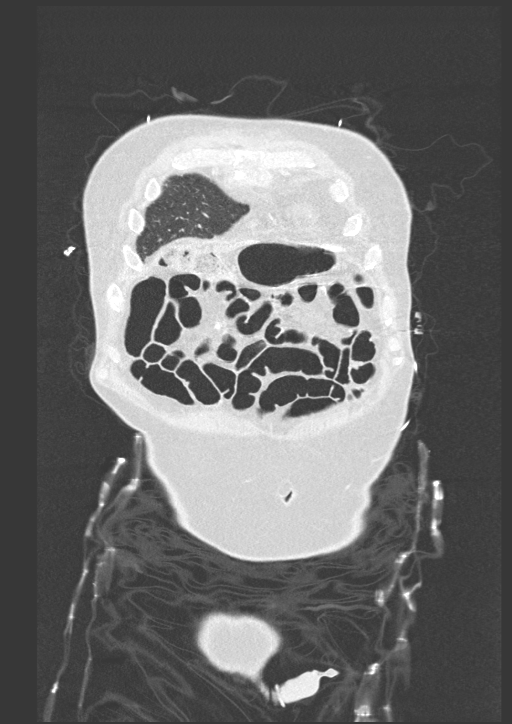
[im 171/426  lung]
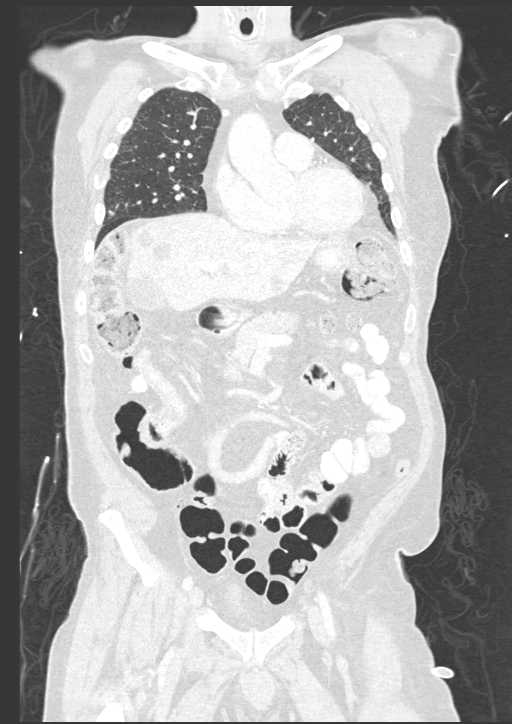
[im 256/426  lung]
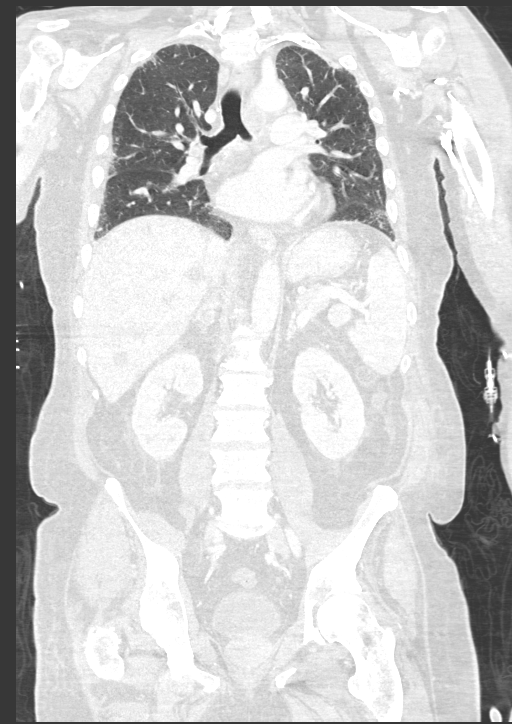

[11 of 36 positions shown; findings below may reference images not displayed]

FINDINGS: CT CHEST FINDINGS

Cardiovascular: Heart is enlarged. Three-vessel coronary artery
atherosclerotic calcifications. No pericardial effusion.
Atherosclerotic calcifications of the aorta.

Mediastinum/Nodes: There is irregular low-density lymph nodes within
the AP window with representative conglomeration of lymph nodes
measuring 21 mm (series 3, image 23). Irregular low-density LEFT
inferior hilar lymph node versus soft tissue mass measure 16 mm in
the short axis (series 3, image 30). Prominent RIGHT paratracheal
lymph node measures 10 mm in the short axis (series 3, image 15).
Coarsely calcified RIGHT mediastinal lymph nodes.There is a
low-density RIGHT hilar lymph node which measures 12 mm in the short
axis (series 3, image 30). No axillary adenopathy.

Lungs/Pleura:

Along the LEFT lower lobe hilum, there is low-density soft tissue
which demonstrates mild mass effect on the adjacent bronchus. It
tracks along the LEFT main pulmonary artery and spans approximately
32 by 17 mm in representative cross-sectional area (series 6, image
299). There is irregularity of the LEFT bronchus at the bifurcation
in the region of this mass, concerning for bronchial wall invasion.

No pleural effusion or pneumothorax. Bibasilar enhancing
consolidative opacities most consistent with atelectasis. In the
peripheral RIGHT upper lobe, there is a wedge-shaped opacity with
scattered internal lucency which measures 22 mm (series 5, image
32). No large central pulmonary embolism. There is a rounded
pulmonary nodule of the LEFT upper lobe which measures 8 x 6 mm
(series 5, image 61).

Musculoskeletal: There is an oval nodule in the upper inner RIGHT
breast which measures 9 mm (series 3, image 30). There is osseous
destruction of the sternum with adjacent soft tissue mass most
consistent with osseous metastatic disease. There is osseous
destruction of the RIGHT posterolateral sixth rib with a pathologic
fracture and associated soft tissue mass. Osseous destruction of the
posterolateral LEFT eighth and eleventh there is a lytic lesion of
T12 which extends into the RIGHT posterior element rib. Osseous
destruction with associated soft tissue mass at the LEFT scapula.

CT ABDOMEN PELVIS FINDINGS

Hepatobiliary: There are innumerable hypodense masses throughout the
liver. Many of these masses demonstrate fluid attenuation and likely
reflect hepatic cysts. However several are indeterminate and may
reflect metastatic disease. Representative indeterminate mass in the
RIGHT liver measures 22 mm (series 3, image 71). Mass in the LEFT
liver is irregular in appearance and is indeterminate; it spans 38
mm (series 3, image 50). Portal vein is patent. Gallbladder is
distended with likely cholelithiasis.

Pancreas: Unremarkable. No pancreatic ductal dilatation or
surrounding inflammatory changes.

Spleen: There are multiple hypodense masses in the spleen,
nonspecific. Multiple punctate calcifications throughout the spleen
consistent with sequela of prior granulomatous infection.

Adrenals/Urinary Tract: There is an indeterminate 24 mm RIGHT
adrenal nodule. The kidneys enhance symmetrically. No
hydronephrosis. No obstructive nephrolithiasis. Bladder is
circumferentially thick-walled, most consistent with sequela of
chronic outlet obstruction.

Stomach/Bowel: No evidence of bowel obstruction. Appendix is normal.
Mild wall thickening of the terminal ileum.

Vascular/Lymphatic: Atherosclerotic calcifications. No suspicious
lymphadenopathy visualized within the pelvis or retroperitoneum.

Reproductive: Prostate is present.

Other: Trace free fluid in the pelvis.

Musculoskeletal: Lytic lesions involving L1, L3, the LEFT iliac
crest, LEFT iliac adjacent to the SI joint, LEFT sacrum, and RIGHT
iliac bone.
IMPRESSION: 1. There is an irregular masslike opacity along the LEFT lower lobe
hilar border with mild mass effect and apparent invasion of the
bronchus at this site. Findings are concerning for primary lung
malignancy.
2. Irregular low-density mediastinal and bilateral hilar adenopathy
are concerning for metastatic nodal disease.
3. Innumerable hypodense masses throughout the liver, many of which
are cysts but several which are indeterminate and may reflect
hepatic metastatic disease. These could be further characterized
with dedicated MRI liver with contrast when and if clinically
appropriate.
4. Indeterminate RIGHT adrenal nodule, suspicious for metastatic
disease. This could be further assessed with MRI if clinically
indicated.
5. There is osseous metastatic disease involving multiple vertebral
bodies in the thoracic and lumbar spine, bilateral ribs, the LEFT
scapula, the sternum and the LEFT greater than RIGHT pelvis.
6. Wedge shape opacity of the RIGHT upper lobe with suggestion of
internal lucency. Findings could be infectious or inflammatory in
etiology. Given wedge-shaped appearance, differential considerations
include pulmonary infarction. No central pulmonary embolism within
the limitations of this exam.
7. Additional pulmonary nodule within the LEFT upper lobe is
indeterminate and may reflect additional site of disease.
8. Nonspecific oval mass in the RIGHT breast. This could be further
assessed with dedicated diagnostic mammogram and ultrasound if
deemed clinically necessary.
9. Mild wall thickening of the terminal ileum, nonspecific.
10. Scattered hypodense masses throughout the spleen, nonspecific.

Aortic Atherosclerosis ([KZ]-[KZ]).

## 2021-02-09 IMAGING — CT CT CHEST W/ CM
2 of 5 series · 11 of 36 positions shown, 13 images · IV contrast (OMNIPAQUE 350)
Comparison: [DATE]

CLINICAL DATA: Cancer of unknown primary, staging; Metastatic
disease evaluation

EXAM:
CT CHEST, ABDOMEN, AND PELVIS WITH CONTRAST
TECHNIQUE: Multidetector CT imaging of the chest, abdomen and pelvis was
performed following the standard protocol during bolus
administration of intravenous contrast.
CONTRAST:  100mL OMNIPAQUE IOHEXOL 350 MG/ML SOLN

[Series 3: cap with · axial · 0.88mm/px · z∈[+544,+1118]mm · 8 of 141 slices shown, 10 images]
[im 13/141  mediastinal]
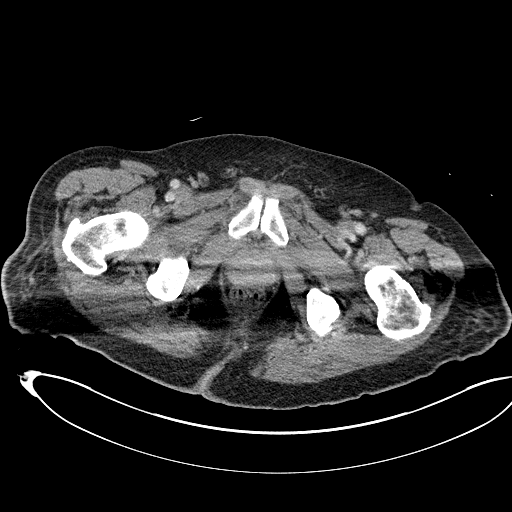
[im 13/141  lung]
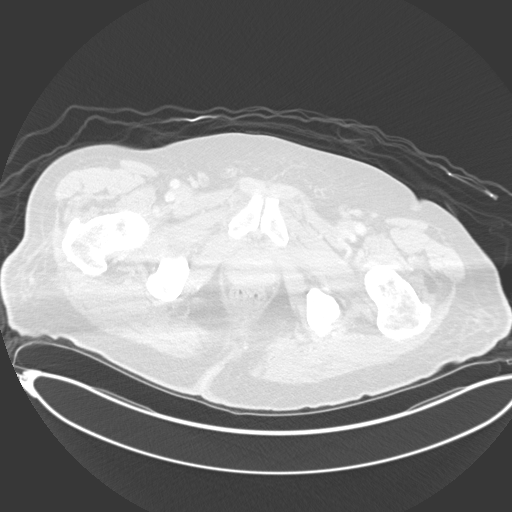
[im 26/141  lung]
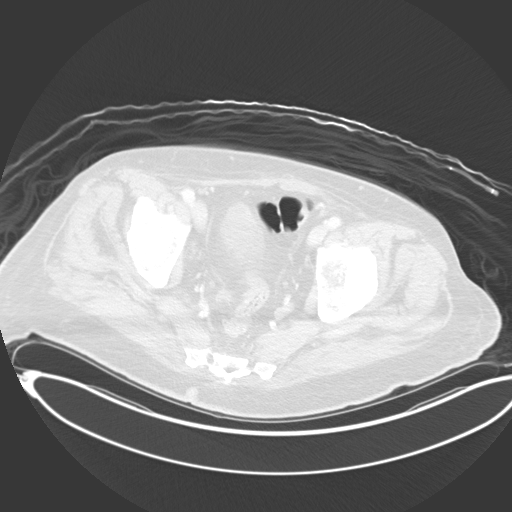
[im 51/141  lung]
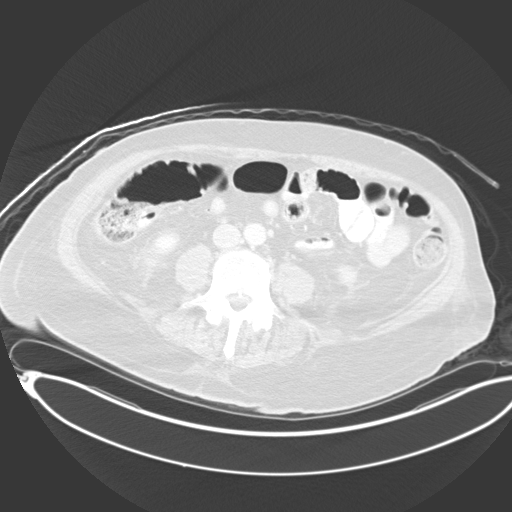
[im 64/141  lung]
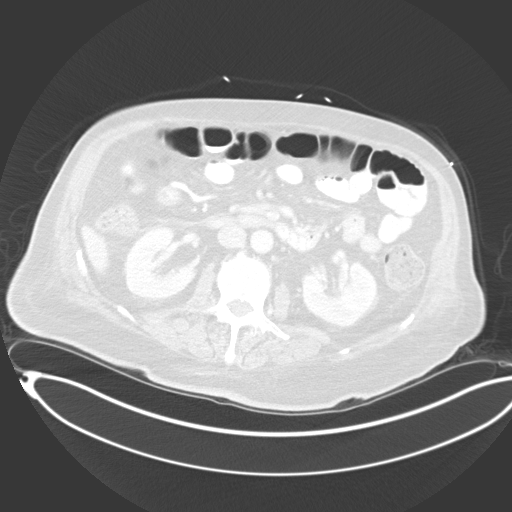
[im 77/141  mediastinal]
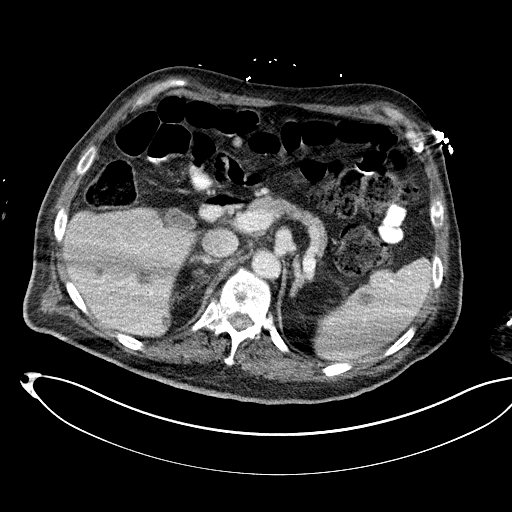
[im 77/141  lung]
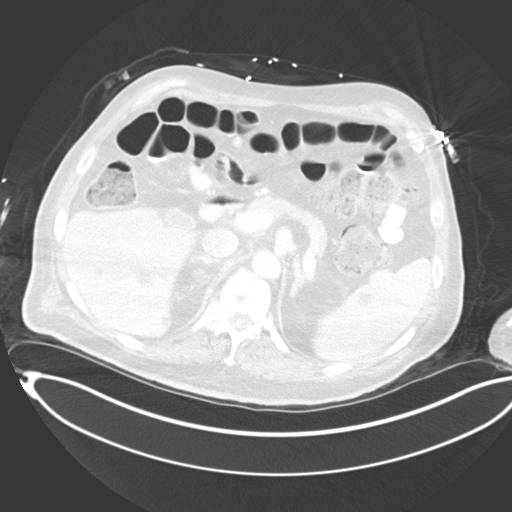
[im 90/141  lung]
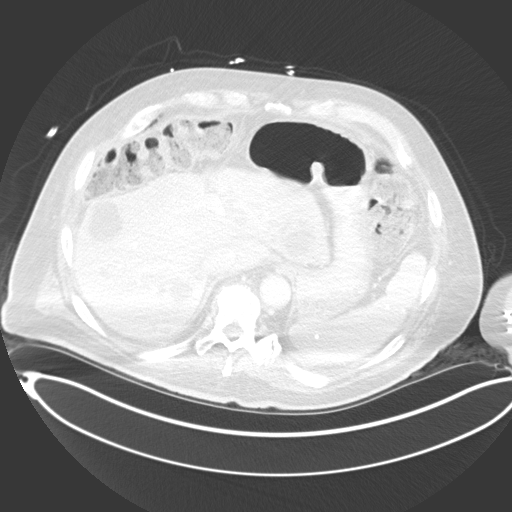
[im 115/141  lung]
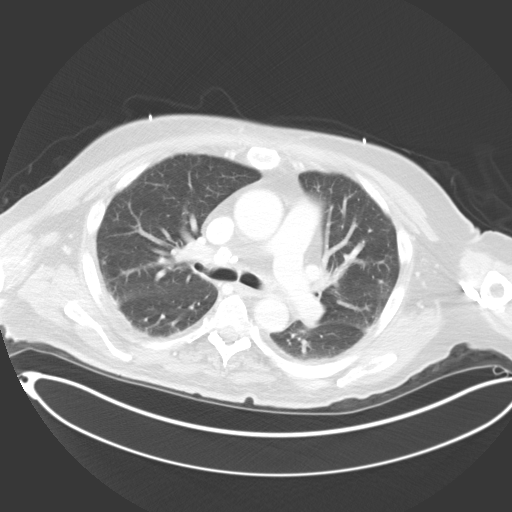
[im 128/141  lung]
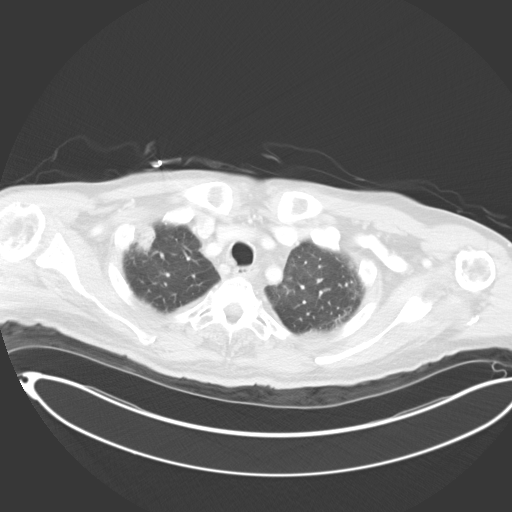

[Series 6: coronals · coronal · 0.99mm/px · 3 of 426 slices shown]
[im 86/426  lung]
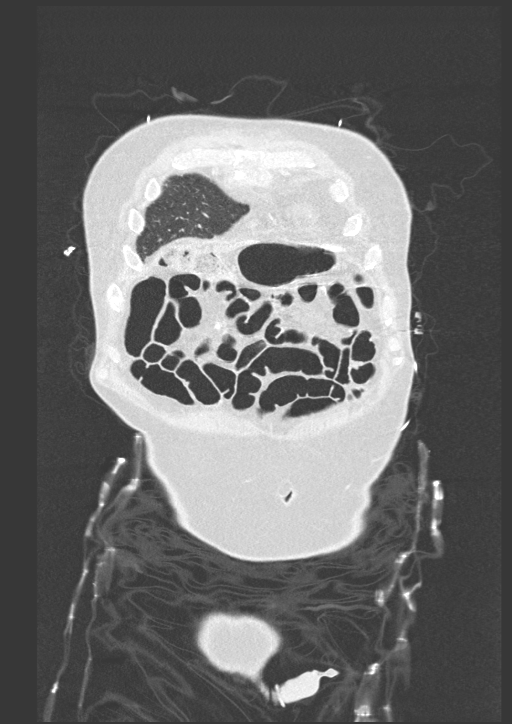
[im 171/426  lung]
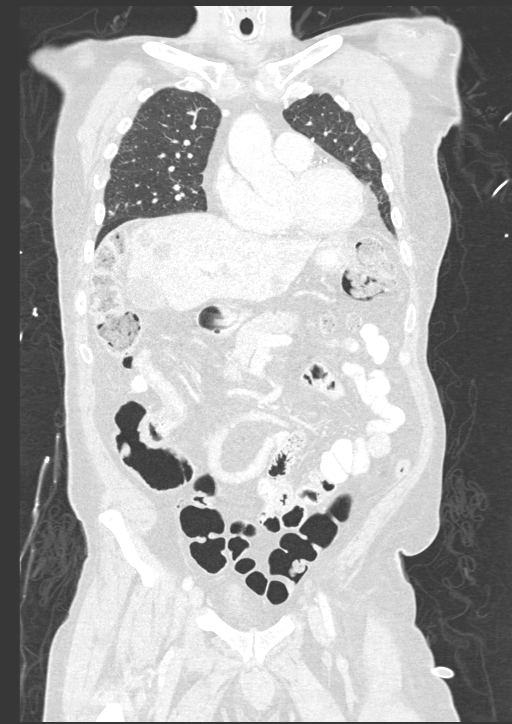
[im 256/426  lung]
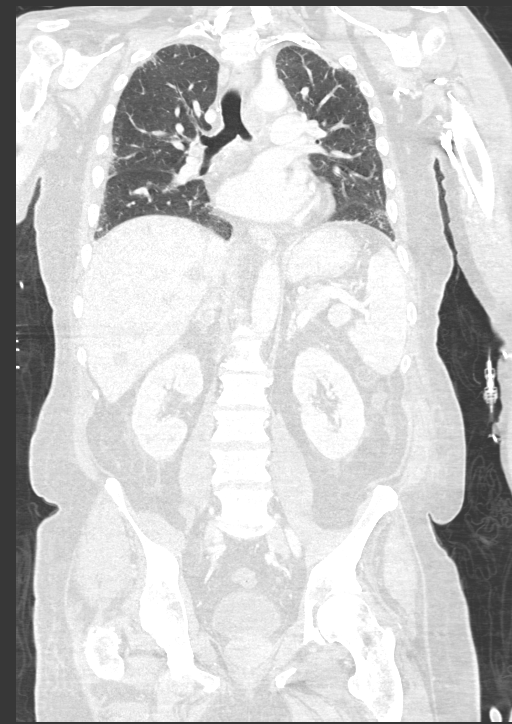

[11 of 36 positions shown; findings below may reference images not displayed]

FINDINGS: CT CHEST FINDINGS

Cardiovascular: Heart is enlarged. Three-vessel coronary artery
atherosclerotic calcifications. No pericardial effusion.
Atherosclerotic calcifications of the aorta.

Mediastinum/Nodes: There is irregular low-density lymph nodes within
the AP window with representative conglomeration of lymph nodes
measuring 21 mm (series 3, image 23). Irregular low-density LEFT
inferior hilar lymph node versus soft tissue mass measure 16 mm in
the short axis (series 3, image 30). Prominent RIGHT paratracheal
lymph node measures 10 mm in the short axis (series 3, image 15).
Coarsely calcified RIGHT mediastinal lymph nodes.There is a
low-density RIGHT hilar lymph node which measures 12 mm in the short
axis (series 3, image 30). No axillary adenopathy.

Lungs/Pleura:

Along the LEFT lower lobe hilum, there is low-density soft tissue
which demonstrates mild mass effect on the adjacent bronchus. It
tracks along the LEFT main pulmonary artery and spans approximately
32 by 17 mm in representative cross-sectional area (series 6, image
299). There is irregularity of the LEFT bronchus at the bifurcation
in the region of this mass, concerning for bronchial wall invasion.

No pleural effusion or pneumothorax. Bibasilar enhancing
consolidative opacities most consistent with atelectasis. In the
peripheral RIGHT upper lobe, there is a wedge-shaped opacity with
scattered internal lucency which measures 22 mm (series 5, image
32). No large central pulmonary embolism. There is a rounded
pulmonary nodule of the LEFT upper lobe which measures 8 x 6 mm
(series 5, image 61).

Musculoskeletal: There is an oval nodule in the upper inner RIGHT
breast which measures 9 mm (series 3, image 30). There is osseous
destruction of the sternum with adjacent soft tissue mass most
consistent with osseous metastatic disease. There is osseous
destruction of the RIGHT posterolateral sixth rib with a pathologic
fracture and associated soft tissue mass. Osseous destruction of the
posterolateral LEFT eighth and eleventh there is a lytic lesion of
T12 which extends into the RIGHT posterior element rib. Osseous
destruction with associated soft tissue mass at the LEFT scapula.

CT ABDOMEN PELVIS FINDINGS

Hepatobiliary: There are innumerable hypodense masses throughout the
liver. Many of these masses demonstrate fluid attenuation and likely
reflect hepatic cysts. However several are indeterminate and may
reflect metastatic disease. Representative indeterminate mass in the
RIGHT liver measures 22 mm (series 3, image 71). Mass in the LEFT
liver is irregular in appearance and is indeterminate; it spans 38
mm (series 3, image 50). Portal vein is patent. Gallbladder is
distended with likely cholelithiasis.

Pancreas: Unremarkable. No pancreatic ductal dilatation or
surrounding inflammatory changes.

Spleen: There are multiple hypodense masses in the spleen,
nonspecific. Multiple punctate calcifications throughout the spleen
consistent with sequela of prior granulomatous infection.

Adrenals/Urinary Tract: There is an indeterminate 24 mm RIGHT
adrenal nodule. The kidneys enhance symmetrically. No
hydronephrosis. No obstructive nephrolithiasis. Bladder is
circumferentially thick-walled, most consistent with sequela of
chronic outlet obstruction.

Stomach/Bowel: No evidence of bowel obstruction. Appendix is normal.
Mild wall thickening of the terminal ileum.

Vascular/Lymphatic: Atherosclerotic calcifications. No suspicious
lymphadenopathy visualized within the pelvis or retroperitoneum.

Reproductive: Prostate is present.

Other: Trace free fluid in the pelvis.

Musculoskeletal: Lytic lesions involving L1, L3, the LEFT iliac
crest, LEFT iliac adjacent to the SI joint, LEFT sacrum, and RIGHT
iliac bone.
IMPRESSION: 1. There is an irregular masslike opacity along the LEFT lower lobe
hilar border with mild mass effect and apparent invasion of the
bronchus at this site. Findings are concerning for primary lung
malignancy.
2. Irregular low-density mediastinal and bilateral hilar adenopathy
are concerning for metastatic nodal disease.
3. Innumerable hypodense masses throughout the liver, many of which
are cysts but several which are indeterminate and may reflect
hepatic metastatic disease. These could be further characterized
with dedicated MRI liver with contrast when and if clinically
appropriate.
4. Indeterminate RIGHT adrenal nodule, suspicious for metastatic
disease. This could be further assessed with MRI if clinically
indicated.
5. There is osseous metastatic disease involving multiple vertebral
bodies in the thoracic and lumbar spine, bilateral ribs, the LEFT
scapula, the sternum and the LEFT greater than RIGHT pelvis.
6. Wedge shape opacity of the RIGHT upper lobe with suggestion of
internal lucency. Findings could be infectious or inflammatory in
etiology. Given wedge-shaped appearance, differential considerations
include pulmonary infarction. No central pulmonary embolism within
the limitations of this exam.
7. Additional pulmonary nodule within the LEFT upper lobe is
indeterminate and may reflect additional site of disease.
8. Nonspecific oval mass in the RIGHT breast. This could be further
assessed with dedicated diagnostic mammogram and ultrasound if
deemed clinically necessary.
9. Mild wall thickening of the terminal ileum, nonspecific.
10. Scattered hypodense masses throughout the spleen, nonspecific.

Aortic Atherosclerosis ([KZ]-[KZ]).

## 2021-02-09 MED ORDER — IOHEXOL 9 MG/ML PO SOLN: 500.0000 mL | ORAL | Status: DC

## 2021-02-09 MED ORDER — IOHEXOL 350 MG/ML SOLN
100.0000 mL | Freq: Once | INTRAVENOUS | Status: AC | PRN
  Administered 2021-02-09: 100 mL via INTRAVENOUS

## 2021-02-09 MED ORDER — IOHEXOL 9 MG/ML PO SOLN
500.0000 mL | ORAL | Status: AC
  Administered 2021-02-09 (×2): 500 mL via ORAL

## 2021-02-09 NOTE — Progress Notes (Signed)
PROGRESS NOTE    Robert [name]  VHQ:[id number] DOB: [birth year] DOA: 02/08/2021 PCP: Farris Has, MD   Brief Narrative:  Robert [name] is a 80 y.o. 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 Admitted for pain control and oncology evaluation.  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 Continue analgesics as needed - plan for transition to PO meds as tolerated for potential DC in the next 24-48h Oncology consulted - Dr Gwenyth Bouillon recommending outpatient evaluation and deferring to radiation oncology, no inpatient follow up from his standpoint necessary [facility] called - Dr Basilio Cairo recommending CT neck/chest/abdomen/pelvis for staging purposes and to evaluate for biopsy site - will follow outpatient once pain well controlled.    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   Leukocytosis, likely reactive  No fever, night sweats, rigors or chills. Hold antibiotics Improving with supportive care/fluids/pain control Cultures pending  Permanent atrial fibrillation (HCC) CHA?DS?-VASc Score of at least 4. Continue Xarelto and metoprolol.     HTN (hypertension) Continue metoprolol 100 mg p.o. twice daily. Monitor BP and heart rate.     Normocytic anemia Monitor H&H.  Transfuse as needed.         DVT prophylaxis:      Xarelto. Code Status:              Full code.  Status is: inpt  Dispo: The patient is from: home               Anticipated d/c is to: home              Anticipated d/c date is: 24-48h              Patient currently NOT medically stable for discharge  Consultants:  Oncology, rads oncology  Procedures:  None  Antimicrobials:  None   Subjective: No acute issues/events overnight  Objective: Vitals:   02/08/21 1753 02/08/21 2154 02/09/21 0158 02/09/21 0618  BP: 137/85 115/62 127/71 (!) 141/81  Pulse: (!) 102 (!) 104 (!) 110 (!) 108  Resp: 18 17 18    Temp: (!) 97.4 F (36.3 C) 98.3 F (36.8 C) (!) 97.5 F (36.4 C) 99.7 F (37.6 C)  TempSrc: Oral Oral Oral Oral  SpO2: 98% 97% 94% 96%  Weight:      Height:        Intake/Output Summary (Last 24 hours) at 02/09/2021 0725 Last data filed at 02/09/2021 4132 Gross per 24 hour  Intake 1295.46 ml  Output 2900 ml  Net -1604.54 ml   Filed Weights   02/08/21 1012  Weight: 98 kg    Examination:  General exam: Appears calm and comfortable  Respiratory system: Clear to auscultation. Respiratory effort normal. Cardiovascular system: S1 & S2 heard, RRR. No JVD, murmurs, rubs, gallops or clicks. No pedal edema. Gastrointestinal system: Abdomen is nondistended, soft and nontender. No organomegaly or masses felt. Normal bowel sounds heard. Central nervous system: Alert and oriented. No focal neurological deficits. Extremities: Symmetric 5 x 5 power. Skin: No rashes, lesions or ulcers Psychiatry:  Judgement and insight appear normal. Mood & affect appropriate.     Data Reviewed: I have personally reviewed following labs and imaging studies  CBC: Recent Labs  Lab 02/08/21 1106 02/09/21 0535  WBC 38.2* 22.9*  NEUTROABS 34.8* 20.9*  HGB 12.5* 10.9*  HCT 39.8 35.4*  MCV 89.0 90.1  PLT 356 235   Basic Metabolic Panel: Recent Labs  Lab 02/08/21 1106 02/09/21 0535  NA 136 137  K 3.8 3.6  CL 97* 98  CO2 31 34*  GLUCOSE 124* 93  BUN 28* 16  CREATININE 1.26* 0.96  CALCIUM 13.4* 11.6*  PHOS  --  2.4*   GFR: Estimated  Creatinine Clearance: 78.2 mL/min (by C-G formula based on SCr of 0.96 mg/dL). Liver Function Tests: Recent Labs  Lab 02/08/21 1106 02/09/21 0535  AST 32  --   ALT 25  --   ALKPHOS 136*  --   BILITOT 0.7  --   PROT 8.5*  --   ALBUMIN 3.6 2.8*   No results for input(s): LIPASE, AMYLASE in the last 168 hours. No results for input(s): AMMONIA in the last 168 hours. Coagulation Profile: No results for input(s): INR, PROTIME in the last 168 hours. Cardiac Enzymes: No results for input(s): CKTOTAL, CKMB, CKMBINDEX, TROPONINI in the last 168 hours. BNP (last 3 results) No results for input(s): PROBNP in the last 8760 hours. HbA1C: No results for input(s): HGBA1C in the last 72 hours. CBG: No results for input(s): GLUCAP in the last 168 hours. Lipid Profile: No results for input(s): CHOL, HDL, LDLCALC, TRIG, CHOLHDL, LDLDIRECT in the last 72 hours. Thyroid Function Tests: No results for input(s): TSH, T4TOTAL, FREET4, T3FREE, THYROIDAB in the last 72 hours. Anemia Panel: No results for input(s): VITAMINB12, FOLATE, FERRITIN, TIBC, IRON, RETICCTPCT in the last 72 hours. Sepsis Labs: Recent Labs  Lab 02/08/21 1500  LATICACIDVEN 2.1*    No results found for this or any previous visit (from the past 240 hour(s)).       Radiology Studies: DG Lumbar Spine Complete  Result Date: 02/08/2021 CLINICAL DATA:  Low back pain extending into the left flank for 3 days appeared EXAM: LUMBAR SPINE - COMPLETE 4+ VIEW COMPARISON:  None. FINDINGS: Alignment is within normal limits. Moderate disc height loss at L5-S1. Mild disc height loss at L1-L2 and L2-L3. advanced facet degenerative changes seen throughout, greatest at L4-L5. IMPRESSION: No acute abnormality of the lumbar spine. Multilevel degenerative changes as above. Electronically Signed   By: Acquanetta Belling M.D.   On: 02/08/2021 11:15   MR LUMBAR SPINE WO CONTRAST  Result Date: 02/08/2021 CLINICAL DATA:  Low back pain, cancer suspected;  technologist note states low back pain and bilateral leg weakness EXAM: MRI LUMBAR SPINE WITHOUT CONTRAST TECHNIQUE: Multiplanar, multisequence MR imaging of the lumbar spine was performed. No intravenous contrast was administered. COMPARISON:  None. FINDINGS: Segmentation:  Standard. Alignment:  No significant listhesis. Vertebrae: Vertebral body heights are maintained. There is patchy STIR hyperintensity at T12 and L1 including involvement of the right T12 pedicle with there may be some extraosseous extension. Patchy STIR hyperintensity at L3 eccentric to the right. Partially imaged possible STIR hyperintensity within the right sacrum. Overall decreased T1 marrow signal. Conus medullaris and cauda equina: Conus extends to the T12-L1 level. Conus and cauda equina appear normal. Paraspinal and other soft tissues: Unremarkable. Disc levels: L1-L2: Disc bulge eccentric to the right. No canal or foraminal stenosis. L2-L3: Disc bulge with endplate osteophytes. Mild facet arthropathy with ligamentum flavum  infolding. No canal or foraminal stenosis. L3-L4: Disc bulge with endplate osteophytes. Mild facet arthropathy with ligamentum flavum infolding. No canal or foraminal stenosis. L4-L5: Disc bulge with endplate osteophytic ridging. Marked facet arthropathy with ligamentum flavum infolding. Minor canal stenosis. Effacement of subarticular recesses. Mild right foraminal stenosis with facet spurring contacting the exiting nerve root. Mild left foraminal stenosis. L5-S1: Marked disc height loss with probable partial bridging. Disc bulge with endplate osteophytic ridging. Moderate facet arthropathy. No canal stenosis. Minor foraminal stenosis. IMPRESSION: Overall abnormal marrow signal with areas of more discrete abnormality. Suspect metastatic disease. Multilevel degenerative changes without high-grade stenosis. Electronically Signed   By: Guadlupe Spanish M.D.   On: 02/08/2021 12:38     Scheduled Meds:  furosemide  20 mg  Intravenous Daily   influenza vaccine adjuvanted  0.5 mL Intramuscular Tomorrow-1000   metoprolol tartrate  100 mg Oral BID   rivaroxaban  20 mg Oral Q supper   Continuous Infusions:  lactated ringers 125 mL/hr at 02/09/21 0231     LOS: 0 days   Time spent:  Azucena Fallen, DO [facility]  If 7PM-7AM, please contact night-coverage [email or web address]  02/09/2021, 7:25 AM

## 2021-02-09 NOTE — Plan of Care (Signed)
  Problem: Education: Goal: Knowledge of General Education information will improve Description Including pain rating scale, medication(s)/side effects and non-pharmacologic comfort measures Outcome: Progressing   

## 2021-02-10 LAB — RENAL FUNCTION PANEL
Albumin: 2.6 g/dL — ABNORMAL LOW (ref 3.5–5.0)
Anion gap: 8 (ref 5–15)
BUN: 17 mg/dL (ref 8–23)
CO2: 33 mmol/L — ABNORMAL HIGH (ref 22–32)
Calcium: 10.1 mg/dL (ref 8.9–10.3)
Chloride: 90 mmol/L — ABNORMAL LOW (ref 98–111)
Creatinine, Ser: 0.88 mg/dL (ref 0.61–1.24)
GFR, Estimated: >60 mL/min (ref >60)
Glucose, Bld: 93 mg/dL (ref 70–99)
Phosphorus: 2.3 mg/dL — ABNORMAL LOW (ref 2.5–4.6)
Potassium: 2.6 mmol/L — CL (ref 3.5–5.1)
Sodium: 131 mmol/L — ABNORMAL LOW (ref 135–145)

## 2021-02-10 LAB — CBC WITH DIFFERENTIAL/PLATELET
Abs Immature Granulocytes: 0.17 10*3/uL — ABNORMAL HIGH (ref 0.00–0.07)
Basophils Absolute: 0 10*3/uL (ref 0.0–0.1)
Basophils Relative: 0 %
Eosinophils Absolute: 0 10*3/uL (ref 0.0–0.5)
Eosinophils Relative: 0 %
HCT: 35.9 % — ABNORMAL LOW (ref 39.0–52.0)
Hemoglobin: 11.3 g/dL — ABNORMAL LOW (ref 13.0–17.0)
Immature Granulocytes: 1 %
Lymphocytes Relative: 2 %
Lymphs Abs: 0.3 10*3/uL — ABNORMAL LOW (ref 0.7–4.0)
MCH: 27.4 pg (ref 26.0–34.0)
MCHC: 31.5 g/dL (ref 30.0–36.0)
MCV: 87.1 fL (ref 80.0–100.0)
Monocytes Absolute: 0.6 10*3/uL (ref 0.1–1.0)
Monocytes Relative: 4 %
Neutro Abs: 12.7 10*3/uL — ABNORMAL HIGH (ref 1.7–7.7)
Neutrophils Relative %: 93 %
Platelets: 203 10*3/uL (ref 150–400)
RBC: 4.12 MIL/uL — ABNORMAL LOW (ref 4.22–5.81)
RDW: 13.6 % (ref 11.5–15.5)
WBC: 13.7 10*3/uL — ABNORMAL HIGH (ref 4.0–10.5)
nRBC: 0 % (ref 0.0–0.2)

## 2021-02-10 LAB — PROCALCITONIN: Procalcitonin: 0.29 ng/mL

## 2021-02-10 LAB — PARATHYROID HORMONE, INTACT (NO CA): PTH: 15 pg/mL (ref 15–65)

## 2021-02-10 MED ORDER — POTASSIUM CHLORIDE 10 MEQ/100ML IV SOLN
10.0000 meq | INTRAVENOUS | Status: AC
  Administered 2021-02-10 (×2): 10 meq via INTRAVENOUS
  Filled 2021-02-10 (×2): qty 100

## 2021-02-10 MED ORDER — SODIUM CHLORIDE 0.9 % IV BOLUS
500.0000 mL | Freq: Once | INTRAVENOUS | Status: AC
  Administered 2021-02-10: 500 mL via INTRAVENOUS

## 2021-02-10 MED ORDER — POTASSIUM CHLORIDE CRYS ER 20 MEQ PO TBCR
40.0000 meq | EXTENDED_RELEASE_TABLET | Freq: Two times a day (BID) | ORAL | Status: AC
  Administered 2021-02-10 (×2): 40 meq via ORAL
  Filled 2021-02-10 (×2): qty 2

## 2021-02-10 NOTE — Progress Notes (Signed)
PROGRESS NOTE    Robert [name]  WUJ:[id number] DOB: 1940-10-24 DOA: 02/08/2021 PCP: Farris Has, MD   Brief Narrative:  Robert [name] is a 80 y.o. 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 Admitted for pain control and oncology evaluation.  Assessment & Plan:   Acute symptomatic hypercalcemia, improving Likely malignant in nature given below Calcium downtrending appropriately - currently WNL PTH WNL   Acute onset lumbago Acute fracture/traumatic injury less likely Possible metastatic disease in the setting of previous history of oral squamous cell carcinoma Rule out new metastaic disease Continue analgesics as needed - plan for transition to PO meds as tolerated for potential DC in the next 24-48h Oncology consulted - Dr Gwenyth Bouillon following [facility] called - Dr Basilio Cairo recommending CT neck/chest/abdomen/pelvis for staging purposes and to evaluate for biopsy site - multiple sources of metastases noted below - Left lower lobe lung, bilateral hilar adenopathy, liver, right adrenal nodule, metastatic disease to bone(vertebral bodies of thoracic/lumbar spine, Left scapula, sternum, and pelvis), R chest mass, spleen. 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   Leukocytosis, likely reactive  No fever, night sweats, rigors or chills. Hold antibiotics Improving with supportive care/fluids/pain control Cultures pending  Permanent atrial fibrillation (HCC) CHADSVASc Score of at least 4. DC Xarelto  Continue metoprolol.   HTN (hypertension) Continue metoprolol 100 mg p.o. twice daily. Monitor BP and heart rate.   Normocytic  anemia Monitor H&H.  Transfuse as needed.     DVT prophylaxis:  Holding xarelot for biopsy as above. Code Status:  Full code.  Status is: inpt  Dispo: The patient is from: home             Anticipated d/c is to: home             Anticipated d/c date is: 24-48h             Patient currently medically stable for discharge  Consultants:  Oncology, rads oncology  Procedures:  None  Antimicrobials:  None   Subjective: No acute issues/events overnight, mild hypotension this afternoon but pain currently well controlled. Awaiting further evaluation/recommendations from oncology.  Objective: Vitals:   02/10/21 1302 02/10/21 1306 02/10/21 1330 02/10/21 1433  BP: (!) 78/45 (!) 84/52 (!) 92/54 92/63  Pulse:      Resp:      Temp:      TempSrc:      SpO2:      Weight:      Height:        Intake/Output Summary (Last 24 hours) at 02/10/2021 1502 Last data filed at 02/10/2021 0954 Gross per 24 hour  Intake 3341.99 ml  Output 2375 ml  Net 966.99 ml    Filed Weights   02/08/21 1012  Weight: 98 kg    Examination:  General exam: Appears calm and comfortable  Respiratory system: Clear to auscultation. Respiratory effort normal. Cardiovascular system: S1 & S2 heard, RRR. No JVD, murmurs, rubs, gallops or clicks. No pedal edema. Gastrointestinal system: Abdomen is nondistended, soft and nontender. No organomegaly or masses felt. Normal bowel sounds heard. Central nervous system: Alert and oriented. No focal neurological deficits. Extremities: Symmetric 5 x 5 power. Skin:  No rashes, lesions or ulcers Psychiatry: Judgement and insight appear normal. Mood & affect appropriate.     Data Reviewed: I have personally reviewed following labs and imaging studies  CBC: Recent Labs  Lab 02/08/21 1106 02/09/21 0535 02/10/21 0434  WBC 38.2* 22.9* 13.7*  NEUTROABS 34.8* 20.9* 12.7*  HGB 12.5* 10.9* 11.3*  HCT 39.8 35.4* 35.9*  MCV 89.0 90.1 87.1  PLT 356 235 203    Basic  Metabolic Panel: Recent Labs  Lab 02/08/21 1106 02/09/21 0535 02/10/21 0434  NA 136 137 131*  K 3.8 3.6 2.6*  CL 97* 98 90*  CO2 31 34* 33*  GLUCOSE 124* 93 93  BUN 28* 16 17  CREATININE 1.26* 0.96 0.88  CALCIUM 13.4* 11.6* 10.1  PHOS  --  2.4* 2.3*    GFR: Estimated Creatinine Clearance: 85.3 mL/min (by C-G formula based on SCr of 0.88 mg/dL). Liver Function Tests: Recent Labs  Lab 02/08/21 1106 02/09/21 0535 02/10/21 0434  AST 32  --   --   ALT 25  --   --   ALKPHOS 136*  --   --   BILITOT 0.7  --   --   PROT 8.5*  --   --   ALBUMIN 3.6 2.8* 2.6*    No results for input(s): LIPASE, AMYLASE in the last 168 hours. No results for input(s): AMMONIA in the last 168 hours. Coagulation Profile: No results for input(s): INR, PROTIME in the last 168 hours. Cardiac Enzymes: No results for input(s): CKTOTAL, CKMB, CKMBINDEX, TROPONINI in the last 168 hours. BNP (last 3 results) No results for input(s): PROBNP in the last 8760 hours. HbA1C: No results for input(s): HGBA1C in the last 72 hours. CBG: No results for input(s): GLUCAP in the last 168 hours. Lipid Profile: No results for input(s): CHOL, HDL, LDLCALC, TRIG, CHOLHDL, LDLDIRECT in the last 72 hours. Thyroid Function Tests: No results for input(s): TSH, T4TOTAL, FREET4, T3FREE, THYROIDAB in the last 72 hours. Anemia Panel: No results for input(s): VITAMINB12, FOLATE, FERRITIN, TIBC, IRON, RETICCTPCT in the last 72 hours. Sepsis Labs: Recent Labs  Lab 02/08/21 1500 02/10/21 0434  PROCALCITON  --  0.29  LATICACIDVEN 2.1*  --      Recent Results (from the past 240 hour(s))  Blood culture (routine x 2)     Status: None (Preliminary result)   Collection Time: 02/08/21  6:48 PM   Specimen: BLOOD  Result Value Ref Range Status   Specimen Description   Final    BLOOD RIGHT ANTECUBITAL Performed at [facility], [address], [place], [place] [address]    Special Requests   Final     BOTTLES DRAWN AEROBIC ONLY Blood Culture adequate volume Performed at [facility], [address], [place], [place] [address]    Culture   Final    NO GROWTH 2 DAYS Performed at [facility], [address] [place], [place] [address]    Report Status PENDING  Incomplete  Blood culture (routine x 2)     Status: None (Preliminary result)   Collection Time: 02/08/21  6:48 PM   Specimen: BLOOD  Result Value Ref Range Status   Specimen Description   Final    BLOOD BLOOD RIGHT ARM Performed at [facility], [address] [place], [place] [address]    Special Requests   Final    BOTTLES DRAWN AEROBIC ONLY Blood Culture adequate volume Performed at [facility], [address] [place], [place] [address]    Culture   Final    NO GROWTH 2 DAYS Performed at [facility], [address], [place], [place] [address]    Report Status PENDING  Incomplete         Radiology Studies: CT SOFT TISSUE NECK W CONTRAST  Result Date: 02/09/2021 CLINICAL DATA:  Metastatic disease evaluation, squamous cell carcinoma of the mouth with surgery and radiation therapy calming infra progressively worsening back pain EXAM: CT NECK WITH CONTRAST TECHNIQUE: Multidetector CT imaging of the neck was performed using the standard protocol following the bolus administration of intravenous contrast. CONTRAST:  OMNIPAQUE IOHEXOL 350 MG/ML SOLN COMPARISON:  08/15/2020 CT FINDINGS: Pharynx and larynx: Status post resection and treatment of a previously noted enhancing mass arising from the buccal surface of the right lower lip, with focal fat, likely related to reconstruction. No definite residual enhancing mass is seen. Salivary glands: No inflammation, mass, or stone. Thyroid: Unchanged mild enlargement of the thyroid. A previously noted central hypodense lesion is not appreciated on the current exam. Lymph nodes: Status post bilateral neck  dissection. No cervical lymphadenopathy. For mediastinal lymph nodes, please see same-day CT chest Vascular: Negative. Limited intracranial: Negative. Visualized orbits: Negative. Mastoids and visualized paranasal sinuses: Mucosal thickening in the right maxillary sinus. Otherwise negative. Skeleton: No acute osseous abnormality in the cervical spine. Status post partial resection of the mandible Upper chest: Please see same-day CT chest Other: Skin thickening overlying the neck, likely sequela of prior radiation. IMPRESSION: 1. Status post resection of and radiation therapy to an enhancing mass arising from the buccal surface of the right lower lip, with no residual enhancing mass seen. Reconstructive changes. No evidence of local recurrence. 2. Status post bilateral neck dissection. No cervical lymphadenopathy. 3. For findings in the chest, including mediastinal lymph nodes, please see same-day CT chest. Electronically Signed   By: Wiliam Ke M.D.   On: 02/09/2021 20:06   CT CHEST W CONTRAST  Result Date: 02/09/2021 CLINICAL DATA:  Cancer of unknown primary, staging; Metastatic disease evaluation EXAM: CT CHEST, ABDOMEN, AND PELVIS WITH CONTRAST TECHNIQUE: Multidetector CT imaging of the chest, abdomen and pelvis was performed following the standard protocol during bolus administration of intravenous contrast. CONTRAST:  OMNIPAQUE IOHEXOL 350 MG/ML SOLN COMPARISON:  February 08, 2021 FINDINGS: CT CHEST FINDINGS Cardiovascular: Heart is enlarged. Three-vessel coronary artery atherosclerotic calcifications. No pericardial effusion. Atherosclerotic calcifications of the aorta. Mediastinum/Nodes: There is irregular low-density lymph nodes within the AP window with representative conglomeration of lymph nodes measuring 21 mm (series 3, image 23). Irregular low-density LEFT inferior hilar lymph node versus soft tissue mass measure 16 mm in the short axis (series 3, image 30). Prominent RIGHT paratracheal  lymph node measures 10 mm in the short axis (series 3, image 15). Coarsely calcified RIGHT mediastinal lymph nodes.There is a low-density RIGHT hilar lymph node which measures 12 mm in the short axis (series 3, image 30). No axillary adenopathy. Lungs/Pleura: Along the LEFT lower lobe hilum, there is low-density soft tissue which demonstrates mild mass effect on the adjacent bronchus. It tracks along the LEFT main pulmonary artery and spans approximately 32 by 17 mm in representative cross-sectional area (series 6, image 299). There is irregularity of the LEFT bronchus at the bifurcation in the region of this mass, concerning for bronchial wall invasion. No pleural effusion or pneumothorax. Bibasilar enhancing consolidative opacities most consistent with atelectasis. In the peripheral RIGHT upper lobe, there is a  wedge-shaped opacity with scattered internal lucency which measures 22 mm (series 5, image 32). No large central pulmonary embolism. There is a rounded pulmonary nodule of the LEFT upper lobe which measures 8 x 6 mm (series 5, image 61). Musculoskeletal: There is an oval nodule in the upper inner RIGHT breast which measures 9 mm (series 3, image 30). There is osseous destruction of the sternum with adjacent soft tissue mass most consistent with osseous metastatic disease. There is osseous destruction of the RIGHT posterolateral sixth rib with a pathologic fracture and associated soft tissue mass. Osseous destruction of the posterolateral LEFT eighth and eleventh there is a lytic lesion of T12 which extends into the RIGHT posterior element rib. Osseous destruction with associated soft tissue mass at the LEFT scapula. CT ABDOMEN PELVIS FINDINGS Hepatobiliary: There are innumerable hypodense masses throughout the liver. Many of these masses demonstrate fluid attenuation and likely reflect hepatic cysts. However several are indeterminate and may reflect metastatic disease. Representative indeterminate mass in  the RIGHT liver measures 22 mm (series 3, image 71). Mass in the LEFT liver is irregular in appearance and is indeterminate; it spans 38 mm (series 3, image 50). Portal vein is patent. Gallbladder is distended with likely cholelithiasis. Pancreas: Unremarkable. No pancreatic ductal dilatation or surrounding inflammatory changes. Spleen: There are multiple hypodense masses in the spleen, nonspecific. Multiple punctate calcifications throughout the spleen consistent with sequela of prior granulomatous infection. Adrenals/Urinary Tract: There is an indeterminate 24 mm RIGHT adrenal nodule. The kidneys enhance symmetrically. No hydronephrosis. No obstructive nephrolithiasis. Bladder is circumferentially thick-walled, most consistent with sequela of chronic outlet obstruction. Stomach/Bowel: No evidence of bowel obstruction. Appendix is normal. Mild wall thickening of the terminal ileum. Vascular/Lymphatic: Atherosclerotic calcifications. No suspicious lymphadenopathy visualized within the pelvis or retroperitoneum. Reproductive: Prostate is present. Other: Trace free fluid in the pelvis. Musculoskeletal: Lytic lesions involving L1, L3, the LEFT iliac crest, LEFT iliac adjacent to the SI joint, LEFT sacrum, and RIGHT iliac bone. IMPRESSION: 1. There is an irregular masslike opacity along the LEFT lower lobe hilar border with mild mass effect and apparent invasion of the bronchus at this site. Findings are concerning for primary lung malignancy. 2. Irregular low-density mediastinal and bilateral hilar adenopathy are concerning for metastatic nodal disease. 3. Innumerable hypodense masses throughout the liver, many of which are cysts but several which are indeterminate and may reflect hepatic metastatic disease. These could be further characterized with dedicated MRI liver with contrast when and if clinically appropriate. 4. Indeterminate RIGHT adrenal nodule, suspicious for metastatic disease. This could be further  assessed with MRI if clinically indicated. 5. There is osseous metastatic disease involving multiple vertebral bodies in the thoracic and lumbar spine, bilateral ribs, the LEFT scapula, the sternum and the LEFT greater than RIGHT pelvis. 6. Wedge shape opacity of the RIGHT upper lobe with suggestion of internal lucency. Findings could be infectious or inflammatory in etiology. Given wedge-shaped appearance, differential considerations include pulmonary infarction. No central pulmonary embolism within the limitations of this exam. 7. Additional pulmonary nodule within the LEFT upper lobe is indeterminate and may reflect additional site of disease. 8. Nonspecific oval mass in the RIGHT breast. This could be further assessed with dedicated diagnostic mammogram and ultrasound if deemed clinically necessary. 9. Mild wall thickening of the terminal ileum, nonspecific. 10. Scattered hypodense masses throughout the spleen, nonspecific. Aortic Atherosclerosis (ICD10-I70.0). Electronically Signed   By: Meda Klinefelter M.D.   On: 02/09/2021 20:09   CT ABDOMEN PELVIS W CONTRAST  Result  Date: 02/09/2021 CLINICAL DATA:  Cancer of unknown primary, staging; Metastatic disease evaluation EXAM: CT CHEST, ABDOMEN, AND PELVIS WITH CONTRAST TECHNIQUE: Multidetector CT imaging of the chest, abdomen and pelvis was performed following the standard protocol during bolus administration of intravenous contrast. CONTRAST:  OMNIPAQUE IOHEXOL 350 MG/ML SOLN COMPARISON:  February 08, 2021 FINDINGS: CT CHEST FINDINGS Cardiovascular: Heart is enlarged. Three-vessel coronary artery atherosclerotic calcifications. No pericardial effusion. Atherosclerotic calcifications of the aorta. Mediastinum/Nodes: There is irregular low-density lymph nodes within the AP window with representative conglomeration of lymph nodes measuring 21 mm (series 3, image 23). Irregular low-density LEFT inferior hilar lymph node versus soft tissue mass measure 16  mm in the short axis (series 3, image 30). Prominent RIGHT paratracheal lymph node measures 10 mm in the short axis (series 3, image 15). Coarsely calcified RIGHT mediastinal lymph nodes.There is a low-density RIGHT hilar lymph node which measures 12 mm in the short axis (series 3, image 30). No axillary adenopathy. Lungs/Pleura: Along the LEFT lower lobe hilum, there is low-density soft tissue which demonstrates mild mass effect on the adjacent bronchus. It tracks along the LEFT main pulmonary artery and spans approximately 32 by 17 mm in representative cross-sectional area (series 6, image 299). There is irregularity of the LEFT bronchus at the bifurcation in the region of this mass, concerning for bronchial wall invasion. No pleural effusion or pneumothorax. Bibasilar enhancing consolidative opacities most consistent with atelectasis. In the peripheral RIGHT upper lobe, there is a wedge-shaped opacity with scattered internal lucency which measures 22 mm (series 5, image 32). No large central pulmonary embolism. There is a rounded pulmonary nodule of the LEFT upper lobe which measures 8 x 6 mm (series 5, image 61). Musculoskeletal: There is an oval nodule in the upper inner RIGHT breast which measures 9 mm (series 3, image 30). There is osseous destruction of the sternum with adjacent soft tissue mass most consistent with osseous metastatic disease. There is osseous destruction of the RIGHT posterolateral sixth rib with a pathologic fracture and associated soft tissue mass. Osseous destruction of the posterolateral LEFT eighth and eleventh there is a lytic lesion of T12 which extends into the RIGHT posterior element rib. Osseous destruction with associated soft tissue mass at the LEFT scapula. CT ABDOMEN PELVIS FINDINGS Hepatobiliary: There are innumerable hypodense masses throughout the liver. Many of these masses demonstrate fluid attenuation and likely reflect hepatic cysts. However several are indeterminate  and may reflect metastatic disease. Representative indeterminate mass in the RIGHT liver measures 22 mm (series 3, image 71). Mass in the LEFT liver is irregular in appearance and is indeterminate; it spans 38 mm (series 3, image 50). Portal vein is patent. Gallbladder is distended with likely cholelithiasis. Pancreas: Unremarkable. No pancreatic ductal dilatation or surrounding inflammatory changes. Spleen: There are multiple hypodense masses in the spleen, nonspecific. Multiple punctate calcifications throughout the spleen consistent with sequela of prior granulomatous infection. Adrenals/Urinary Tract: There is an indeterminate 24 mm RIGHT adrenal nodule. The kidneys enhance symmetrically. No hydronephrosis. No obstructive nephrolithiasis. Bladder is circumferentially thick-walled, most consistent with sequela of chronic outlet obstruction. Stomach/Bowel: No evidence of bowel obstruction. Appendix is normal. Mild wall thickening of the terminal ileum. Vascular/Lymphatic: Atherosclerotic calcifications. No suspicious lymphadenopathy visualized within the pelvis or retroperitoneum. Reproductive: Prostate is present. Other: Trace free fluid in the pelvis. Musculoskeletal: Lytic lesions involving L1, L3, the LEFT iliac crest, LEFT iliac adjacent to the SI joint, LEFT sacrum, and RIGHT iliac bone. IMPRESSION: 1. There is an irregular masslike  opacity along the LEFT lower lobe hilar border with mild mass effect and apparent invasion of the bronchus at this site. Findings are concerning for primary lung malignancy. 2. Irregular low-density mediastinal and bilateral hilar adenopathy are concerning for metastatic nodal disease. 3. Innumerable hypodense masses throughout the liver, many of which are cysts but several which are indeterminate and may reflect hepatic metastatic disease. These could be further characterized with dedicated MRI liver with contrast when and if clinically appropriate. 4. Indeterminate RIGHT  adrenal nodule, suspicious for metastatic disease. This could be further assessed with MRI if clinically indicated. 5. There is osseous metastatic disease involving multiple vertebral bodies in the thoracic and lumbar spine, bilateral ribs, the LEFT scapula, the sternum and the LEFT greater than RIGHT pelvis. 6. Wedge shape opacity of the RIGHT upper lobe with suggestion of internal lucency. Findings could be infectious or inflammatory in etiology. Given wedge-shaped appearance, differential considerations include pulmonary infarction. No central pulmonary embolism within the limitations of this exam. 7. Additional pulmonary nodule within the LEFT upper lobe is indeterminate and may reflect additional site of disease. 8. Nonspecific oval mass in the RIGHT breast. This could be further assessed with dedicated diagnostic mammogram and ultrasound if deemed clinically necessary. 9. Mild wall thickening of the terminal ileum, nonspecific. 10. Scattered hypodense masses throughout the spleen, nonspecific. Aortic Atherosclerosis (ICD10-I70.0). Electronically Signed   By: Meda Klinefelter M.D.   On: 02/09/2021 20:09     Scheduled Meds:  furosemide  20 mg Intravenous Daily   influenza vaccine adjuvanted  0.5 mL Intramuscular Tomorrow-1000   metoprolol tartrate  100 mg Oral BID   potassium chloride  40 mEq Oral BID   rivaroxaban  20 mg Oral Q supper   Continuous Infusions:  lactated ringers Stopped (02/10/21 1000)     LOS: 1 day   Time spent:  Azucena Fallen, DO [facility]  If 7PM-7AM, please contact night-coverage [email or web address]  02/10/2021, 3:02 PM

## 2021-02-10 NOTE — Consult Note (Signed)
[place] [phone]   [phone]  CONSULT NOTE  REFERRING PHYSICIAN: Dr. Holli Humbles  REASON FOR CONSULTATION:  80 years old white male with likely metastatic cancer.  HPI Robert [name] is a 80 y.o. 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 Owens Shark at [facility].  In May 2022 the patient developed new squamous cell carcinoma in the right gingivobuccal sulcus and the submandibular neck mass.  He was seen by Dr. 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  The tumor extends to focally involve the inked resection margin and did not invade the edges and submandibular gland.  The final pathology calcification at that time was T4a, PN 0.  He was seen by Dr. Chryl Heck and Dr. Isidore Moos for consideration for course of concurrent chemoradiation but the patient was concerned about the healing process in the right forearm which could have been affected by systemic chemotherapy.  He only underwent adjuvant radiotherapy under the care of Dr. Isidore Moos for 33 fractions.  He was last seen by Dr. Chryl Heck on November 05, 2020.  She is currently on maternity leave and I was asked to see the patient today for evaluation of his condition.  He was admitted to the hospital with low back pain and  weakness in the lower extremity after a fall.  MRI of the lumbar spine showed overall abnormal marrow signals with areas of more discrete abnormality suspicious for metastatic disease.  He also had multilevel degenerative changes without high-grade stenosis.  The patient had full CT images of the neck, chest, abdomen pelvis performed yesterday and that showed no evidence for disease recurrence in the neck area.  CT scan of the chest, abdomen pelvis showed irregular masslike opacity along the left lower lobe hilar border with mild mass-effect and apparent invasion of the bronchus at this site.  This is concerning for primary lung malignancy.  There was also irregular low-density mediastinal and bilateral hilar adenopathy concerning for metastatic nodal disease.  The scan also showed innumerable hypodense masses throughout the liver many of which are cysts but several are indeterminate and may reflect hepatic metastatic disease and MRI of the liver was recommended.  There was also indeterminant right adrenal nodule suspicious for metastatic disease.  There was osseous metastatic disease involving multiple vertebral bodies in the thoracic and lumbar spines, bilateral ribs, left scapula as well as the sternum and left greater than right pelvis.  The scan also showed wedge-shaped opacity of the right upper lobe with suggestion of internal lucency that could be inflammatory or infectious in etiology but pulmonary infarction was not excluded.  There was no central pulmonary embolus.  The patient also has a nonspecific oval mass in the right breast. When seen today the patient is feeling fine except for the slurred speech and the back pain.  He denied having any current chest pain, shortness of breath, cough or hemoptysis.  He denied having any fever or chills.  He has no nausea, vomiting, diarrhea or constipation.  He has no headache or visual changes. Family history significant for mother with COPD and father had  hypertension. The patient has no history of smoking but he used to chew tobacco.  No history of alcohol or drug abuse.  He was accompanied today at the bedside by his daughter Robert [name], his son-in-law and his granddaughter.  HPI  Past Medical History:  Diagnosis Date   A-fib ([place])    HTN (hypertension)     Past Surgical History:  Procedure Laterality Date   DENTAL SURGERY      Family History  Problem Relation Age of Onset   COPD Mother        Heavy smoker for 62 years   Hypertension Father        uncontrolled    Social History Social History   Tobacco Use   Smoking status: Never   Smokeless tobacco: Former    Types: Nurse, children's Use: Former  Substance Use Topics   Alcohol use: Yes    Comment: occ   Drug use: Never    No Known Allergies  Current Facility-Administered Medications  Medication Dose Route Frequency Provider Last Rate Last Admin   acetaminophen (TYLENOL) tablet 650 mg  650 mg Oral Q6H PRN Reubin Milan, MD       Or   acetaminophen (TYLENOL) suppository 650 mg  650 mg Rectal Q6H PRN Reubin Milan, MD       cyclobenzaprine (FLEXERIL) tablet 5 mg  5 mg Oral TID PRN Reubin Milan, MD       docusate sodium (COLACE) capsule 100 mg  100 mg Oral BID PRN Reubin Milan, MD   100 mg at 02/09/21 2200   furosemide (LASIX) injection 20 mg  20 mg Intravenous Daily Reubin Milan, MD   20 mg at 02/10/21 0945   influenza vaccine adjuvanted (FLUAD) injection 0.5 mL  0.5 mL Intramuscular Tomorrow-1000 Reubin Milan, MD       ketorolac (TORADOL) 15 MG/ML injection 15 mg  15 mg Intravenous Q6H PRN Reubin Milan, MD   15 mg at 02/08/21 1922   lactated ringers infusion   Intravenous Continuous Reubin Milan, MD   Stopped at 02/10/21 1000   metoprolol tartrate (LOPRESSOR) tablet 100 mg  100 mg Oral BID Reubin Milan, MD   100 mg at 02/10/21 0943   ondansetron (ZOFRAN) tablet 4 mg  4 mg Oral Q6H PRN Reubin Milan, MD       Or   ondansetron [facility]) injection 4 mg  4 mg Intravenous Q6H PRN Reubin Milan, MD       potassium chloride SA (KLOR-CON) CR tablet 40 mEq  40 mEq Oral BID Lovey Newcomer T, NP   40 mEq at 02/10/21 3086   rivaroxaban (XARELTO) tablet 20 mg  20 mg Oral Q supper Reubin Milan, MD   20 mg at 02/09/21 2201    Review of Systems  Constitutional: positive for fatigue Eyes: negative Ears, nose, mouth, throat, and face: negative Respiratory: negative Cardiovascular: negative Gastrointestinal: negative Genitourinary:negative Integument/breast: negative Hematologic/lymphatic: negative Musculoskeletal:positive for back pain Neurological: negative Behavioral/Psych: negative Endocrine: negative Allergic/Immunologic: negative  Physical Exam  VHQ:IONGE, healthy, no distress, well nourished, and well developed SKIN: skin color, texture, turgor are normal, no rashes or significant lesions HEAD: Normocephalic, No masses, lesions, tenderness or abnormalities EYES: normal,  PERRLA, Conjunctiva are pink and non-injected EARS: External ears normal, Canals clear OROPHARYNX:no exudate, no erythema, and dentition normal  NECK: supple, no adenopathy, no JVD LYMPH:  no palpable lymphadenopathy, no hepatosplenomegaly BREAST:not examined LUNGS: clear to auscultation , and palpation HEART: regular rate & rhythm, no murmurs, and no gallops ABDOMEN:abdomen soft, non-tender, normal bowel sounds, and no masses or organomegaly BACK: No CVA tenderness, Range of motion is normal EXTREMITIES:no joint deformities, effusion, or inflammation, no edema  NEURO: alert & oriented x 3 with fluent speech, no focal motor/sensory deficits  PERFORMANCE STATUS: ECOG 1  LABORATORY DATA: Lab Results  Component Value Date   WBC 13.7 (H) 02/10/2021   HGB 11.3 (L) 02/10/2021   HCT 35.9 (L) 02/10/2021   MCV 87.1 02/10/2021   PLT 203 02/10/2021    @LASTCHEM @  RADIOGRAPHIC STUDIES: DG  Lumbar Spine Complete  Result Date: 02/08/2021 CLINICAL DATA:  Low back pain extending into the left flank for 3 days appeared EXAM: LUMBAR SPINE - COMPLETE 4+ VIEW COMPARISON:  None. FINDINGS: Alignment is within normal limits. Moderate disc height loss at L5-S1. Mild disc height loss at L1-L2 and L2-L3. advanced facet degenerative changes seen throughout, greatest at L4-L5. IMPRESSION: No acute abnormality of the lumbar spine. Multilevel degenerative changes as above. Electronically Signed   By: Miachel Roux M.D.   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 CONTRAST:  117mL OMNIPAQUE IOHEXOL 350 MG/ML SOLN COMPARISON:  08/15/2020 CT FINDINGS: Pharynx and larynx: Status post resection and treatment of a previously noted enhancing mass arising from the buccal surface of the right lower lip, with focal fat, likely related to reconstruction. No definite residual enhancing mass is seen. Salivary glands: No inflammation, mass, or stone. Thyroid: Unchanged mild enlargement of the thyroid. A previously noted central hypodense lesion is not appreciated on the current exam. Lymph nodes: Status post bilateral neck dissection. No cervical lymphadenopathy. For mediastinal lymph nodes, please see same-day CT chest Vascular: Negative. Limited intracranial: Negative. Visualized orbits: Negative. Mastoids and visualized paranasal sinuses: Mucosal thickening in the right maxillary sinus. Otherwise negative. Skeleton: No acute osseous abnormality in the cervical spine. Status post partial resection of the mandible Upper chest: Please see same-day CT  chest Other: Skin thickening overlying the neck, likely sequela of prior radiation. IMPRESSION: 1. Status post resection of and radiation therapy to an enhancing mass arising from the buccal surface of the right lower lip, with no residual enhancing mass seen. Reconstructive changes. No evidence of local recurrence. 2. Status post bilateral neck dissection. No cervical lymphadenopathy. 3. For findings in the chest, including mediastinal lymph nodes, please see same-day CT chest. Electronically Signed   By: Merilyn Baba M.D.   On: 02/09/2021 20:06   CT CHEST W CONTRAST  Result Date: 02/09/2021 CLINICAL DATA:  Cancer of unknown primary, staging; Metastatic disease evaluation EXAM: CT CHEST, ABDOMEN, AND PELVIS WITH CONTRAST TECHNIQUE: Multidetector CT imaging of the chest, abdomen and pelvis was performed following the standard protocol during bolus administration of intravenous contrast. CONTRAST:  133mL OMNIPAQUE IOHEXOL 350 MG/ML SOLN COMPARISON:  February 08, 2021 FINDINGS: CT CHEST FINDINGS Cardiovascular: Heart is enlarged. Three-vessel coronary artery atherosclerotic calcifications. No pericardial  effusion. Atherosclerotic calcifications of the aorta. Mediastinum/Nodes: There is irregular low-density lymph nodes within the AP window with representative conglomeration of lymph nodes measuring 21 mm (series 3, image 23). Irregular low-density LEFT inferior hilar lymph node versus soft tissue mass measure 16 mm in the short axis (series 3, image 30). Prominent RIGHT paratracheal lymph node measures 10 mm in the short axis (series 3, image 15). Coarsely calcified RIGHT mediastinal lymph nodes.There is a low-density RIGHT hilar lymph node which measures 12 mm in the short axis (series 3, image 30). No axillary adenopathy. Lungs/Pleura: Along the LEFT lower lobe hilum, there is low-density soft tissue which demonstrates mild mass effect on the adjacent bronchus. It tracks along the LEFT main pulmonary artery and  spans approximately 32 by 17 mm in representative cross-sectional area (series 6, image 299). There is irregularity of the LEFT bronchus at the bifurcation in the region of this mass, concerning for bronchial wall invasion. No pleural effusion or pneumothorax. Bibasilar enhancing consolidative opacities most consistent with atelectasis. In the peripheral RIGHT upper lobe, there is a wedge-shaped opacity with scattered internal lucency which measures 22 mm (series 5, image 32). No large central pulmonary embolism. There is a rounded pulmonary nodule of the LEFT upper lobe which measures 8 x 6 mm (series 5, image 61). Musculoskeletal: There is an oval nodule in the upper inner RIGHT breast which measures 9 mm (series 3, image 30). There is osseous destruction of the sternum with adjacent soft tissue mass most consistent with osseous metastatic disease. There is osseous destruction of the RIGHT posterolateral sixth rib with a pathologic fracture and associated soft tissue mass. Osseous destruction of the posterolateral LEFT eighth and eleventh there is a lytic lesion of T12 which extends into the RIGHT posterior element rib. Osseous destruction with associated soft tissue mass at the LEFT scapula. CT ABDOMEN PELVIS FINDINGS Hepatobiliary: There are innumerable hypodense masses throughout the liver. Many of these masses demonstrate fluid attenuation and likely reflect hepatic cysts. However several are indeterminate and may reflect metastatic disease. Representative indeterminate mass in the RIGHT liver measures 22 mm (series 3, image 71). Mass in the LEFT liver is irregular in appearance and is indeterminate; it spans 38 mm (series 3, image 50). Portal vein is patent. Gallbladder is distended with likely cholelithiasis. Pancreas: Unremarkable. No pancreatic ductal dilatation or surrounding inflammatory changes. Spleen: There are multiple hypodense masses in the spleen, nonspecific. Multiple punctate calcifications  throughout the spleen consistent with sequela of prior granulomatous infection. Adrenals/Urinary Tract: There is an indeterminate 24 mm RIGHT adrenal nodule. The kidneys enhance symmetrically. No hydronephrosis. No obstructive nephrolithiasis. Bladder is circumferentially thick-walled, most consistent with sequela of chronic outlet obstruction. Stomach/Bowel: No evidence of bowel obstruction. Appendix is normal. Mild wall thickening of the terminal ileum. Vascular/Lymphatic: Atherosclerotic calcifications. No suspicious lymphadenopathy visualized within the pelvis or retroperitoneum. Reproductive: Prostate is present. Other: Trace free fluid in the pelvis. Musculoskeletal: Lytic lesions involving L1, L3, the LEFT iliac crest, LEFT iliac adjacent to the SI joint, LEFT sacrum, and RIGHT iliac bone. IMPRESSION: 1. There is an irregular masslike opacity along the LEFT lower lobe hilar border with mild mass effect and apparent invasion of the bronchus at this site. Findings are concerning for primary lung malignancy. 2. Irregular low-density mediastinal and bilateral hilar adenopathy are concerning for metastatic nodal disease. 3. Innumerable hypodense masses throughout the liver, many of which are cysts but several which are indeterminate and may reflect hepatic metastatic disease. These could be further characterized with  dedicated MRI liver with contrast when and if clinically appropriate. 4. Indeterminate RIGHT adrenal nodule, suspicious for metastatic disease. This could be further assessed with MRI if clinically indicated. 5. There is osseous metastatic disease involving multiple vertebral bodies in the thoracic and lumbar spine, bilateral ribs, the LEFT scapula, the sternum and the LEFT greater than RIGHT pelvis. 6. Wedge shape opacity of the RIGHT upper lobe with suggestion of internal lucency. Findings could be infectious or inflammatory in etiology. Given wedge-shaped appearance, differential considerations  include pulmonary infarction. No central pulmonary embolism within the limitations of this exam. 7. Additional pulmonary nodule within the LEFT upper lobe is indeterminate and may reflect additional site of disease. 8. Nonspecific oval mass in the RIGHT breast. This could be further assessed with dedicated diagnostic mammogram and ultrasound if deemed clinically necessary. 9. Mild wall thickening of the terminal ileum, nonspecific. 10. Scattered hypodense masses throughout the spleen, nonspecific. Aortic Atherosclerosis (ICD10-I70.0). Electronically Signed   By: Valentino Saxon M.D.   On: 02/09/2021 20:09   MR LUMBAR SPINE WO CONTRAST  Result Date: 02/08/2021 CLINICAL DATA:  Low back pain, cancer suspected; technologist note states low back pain and bilateral leg weakness EXAM: MRI LUMBAR SPINE WITHOUT CONTRAST TECHNIQUE: Multiplanar, multisequence MR imaging of the lumbar spine was performed. No intravenous contrast was administered. COMPARISON:  None. FINDINGS: Segmentation:  Standard. Alignment:  No significant listhesis. Vertebrae: Vertebral body heights are maintained. There is patchy STIR hyperintensity at T12 and L1 including involvement of the right T12 pedicle with there may be some extraosseous extension. Patchy STIR hyperintensity at L3 eccentric to the right. Partially imaged possible STIR hyperintensity within the right sacrum. Overall decreased T1 marrow signal. Conus medullaris and cauda equina: Conus extends to the T12-L1 level. Conus and cauda equina appear normal. Paraspinal and other soft tissues: Unremarkable. Disc levels: L1-L2: Disc bulge eccentric to the right. No canal or foraminal stenosis. L2-L3: Disc bulge with endplate osteophytes. Mild facet arthropathy with ligamentum flavum infolding. No canal or foraminal stenosis. L3-L4: Disc bulge with endplate osteophytes. Mild facet arthropathy with ligamentum flavum infolding. No canal or foraminal stenosis. L4-L5: Disc bulge with  endplate osteophytic ridging. Marked facet arthropathy with ligamentum flavum infolding. Minor canal stenosis. Effacement of subarticular recesses. Mild right foraminal stenosis with facet spurring contacting the exiting nerve root. Mild left foraminal stenosis. L5-S1: Marked disc height loss with probable partial bridging. Disc bulge with endplate osteophytic ridging. Moderate facet arthropathy. No canal stenosis. Minor foraminal stenosis. IMPRESSION: Overall abnormal marrow signal with areas of more discrete abnormality. Suspect metastatic disease. Multilevel degenerative changes without high-grade stenosis. Electronically Signed   By: Macy Mis M.D.   On: 02/08/2021 12:38   CT ABDOMEN PELVIS W CONTRAST  Result Date: 02/09/2021 CLINICAL DATA:  Cancer of unknown primary, staging; Metastatic disease evaluation EXAM: CT CHEST, ABDOMEN, AND PELVIS WITH CONTRAST TECHNIQUE: Multidetector CT imaging of the chest, abdomen and pelvis was performed following the standard protocol during bolus administration of intravenous contrast. CONTRAST:  161mL OMNIPAQUE IOHEXOL 350 MG/ML SOLN COMPARISON:  February 08, 2021 FINDINGS: CT CHEST FINDINGS Cardiovascular: Heart is enlarged. Three-vessel coronary artery atherosclerotic calcifications. No pericardial effusion. Atherosclerotic calcifications of the aorta. Mediastinum/Nodes: There is irregular low-density lymph nodes within the AP window with representative conglomeration of lymph nodes measuring 21 mm (series 3, image 23). Irregular low-density LEFT inferior hilar lymph node versus soft tissue mass measure 16 mm in the short axis (series 3, image 30). Prominent RIGHT paratracheal lymph node measures 10 mm in  the short axis (series 3, image 15). Coarsely calcified RIGHT mediastinal lymph nodes.There is a low-density RIGHT hilar lymph node which measures 12 mm in the short axis (series 3, image 30). No axillary adenopathy. Lungs/Pleura: Along the LEFT lower lobe hilum,  there is low-density soft tissue which demonstrates mild mass effect on the adjacent bronchus. It tracks along the LEFT main pulmonary artery and spans approximately 32 by 17 mm in representative cross-sectional area (series 6, image 299). There is irregularity of the LEFT bronchus at the bifurcation in the region of this mass, concerning for bronchial wall invasion. No pleural effusion or pneumothorax. Bibasilar enhancing consolidative opacities most consistent with atelectasis. In the peripheral RIGHT upper lobe, there is a wedge-shaped opacity with scattered internal lucency which measures 22 mm (series 5, image 32). No large central pulmonary embolism. There is a rounded pulmonary nodule of the LEFT upper lobe which measures 8 x 6 mm (series 5, image 61). Musculoskeletal: There is an oval nodule in the upper inner RIGHT breast which measures 9 mm (series 3, image 30). There is osseous destruction of the sternum with adjacent soft tissue mass most consistent with osseous metastatic disease. There is osseous destruction of the RIGHT posterolateral sixth rib with a pathologic fracture and associated soft tissue mass. Osseous destruction of the posterolateral LEFT eighth and eleventh there is a lytic lesion of T12 which extends into the RIGHT posterior element rib. Osseous destruction with associated soft tissue mass at the LEFT scapula. CT ABDOMEN PELVIS FINDINGS Hepatobiliary: There are innumerable hypodense masses throughout the liver. Many of these masses demonstrate fluid attenuation and likely reflect hepatic cysts. However several are indeterminate and may reflect metastatic disease. Representative indeterminate mass in the RIGHT liver measures 22 mm (series 3, image 71). Mass in the LEFT liver is irregular in appearance and is indeterminate; it spans 38 mm (series 3, image 50). Portal vein is patent. Gallbladder is distended with likely cholelithiasis. Pancreas: Unremarkable. No pancreatic ductal dilatation  or surrounding inflammatory changes. Spleen: There are multiple hypodense masses in the spleen, nonspecific. Multiple punctate calcifications throughout the spleen consistent with sequela of prior granulomatous infection. Adrenals/Urinary Tract: There is an indeterminate 24 mm RIGHT adrenal nodule. The kidneys enhance symmetrically. No hydronephrosis. No obstructive nephrolithiasis. Bladder is circumferentially thick-walled, most consistent with sequela of chronic outlet obstruction. Stomach/Bowel: No evidence of bowel obstruction. Appendix is normal. Mild wall thickening of the terminal ileum. Vascular/Lymphatic: Atherosclerotic calcifications. No suspicious lymphadenopathy visualized within the pelvis or retroperitoneum. Reproductive: Prostate is present. Other: Trace free fluid in the pelvis. Musculoskeletal: Lytic lesions involving L1, L3, the LEFT iliac crest, LEFT iliac adjacent to the SI joint, LEFT sacrum, and RIGHT iliac bone. IMPRESSION: 1. There is an irregular masslike opacity along the LEFT lower lobe hilar border with mild mass effect and apparent invasion of the bronchus at this site. Findings are concerning for primary lung malignancy. 2. Irregular low-density mediastinal and bilateral hilar adenopathy are concerning for metastatic nodal disease. 3. Innumerable hypodense masses throughout the liver, many of which are cysts but several which are indeterminate and may reflect hepatic metastatic disease. These could be further characterized with dedicated MRI liver with contrast when and if clinically appropriate. 4. Indeterminate RIGHT adrenal nodule, suspicious for metastatic disease. This could be further assessed with MRI if clinically indicated. 5. There is osseous metastatic disease involving multiple vertebral bodies in the thoracic and lumbar spine, bilateral ribs, the LEFT scapula, the sternum and the LEFT greater than RIGHT pelvis. 6. Wedge  shape opacity of the RIGHT upper lobe with suggestion  of internal lucency. Findings could be infectious or inflammatory in etiology. Given wedge-shaped appearance, differential considerations include pulmonary infarction. No central pulmonary embolism within the limitations of this exam. 7. Additional pulmonary nodule within the LEFT upper lobe is indeterminate and may reflect additional site of disease. 8. Nonspecific oval mass in the RIGHT breast. This could be further assessed with dedicated diagnostic mammogram and ultrasound if deemed clinically necessary. 9. Mild wall thickening of the terminal ileum, nonspecific. 10. Scattered hypodense masses throughout the spleen, nonspecific. Aortic Atherosclerosis (ICD10-I70.0). Electronically Signed   By: Valentino Saxon M.D.   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 Isidore Moos The patient presented today with evidence of metastatic disease involving the lung as well as mediastinal lymphadenopathy and multiple suspicious liver lesion as well as multiple osseous metastasis and right adrenal suspicious metastatic disease.  PLAN: I had a lengthy discussion with the patient and his family today about his current condition and further investigation to confirm his diagnosis as well as treatment options. I personally and independently reviewed the scan images and discussed the results with the patient and his family. I recommended for the patient to have MRI of the liver for further identification of the suspicious liver lesion and also to choose the right lesion for ultrasound-guided core biopsy for confirmation of tissue diagnosis.  If the lesion in the liver are consistent  with cyst, I would refer the patient to pulmonary medicine for consideration of bronchoscopy with endobronchial ultrasound and biopsies for tissue diagnosis. I will arrange for the patient to have a PET scan on outpatient basis after discharge to complete the staging work-up.  I will also order MRI of the brain to rule out brain metastasis. Once the tissue diagnosis is confirmed, I will have a discussion with the patient regarding his treatment options.  I briefly discussed with him the option of palliative care and hospice referral versus consideration of palliative systemic chemotherapy plus immunotherapy if needed in the future.  The patient is interested in treatment if needed. I will arrange a follow-up appointment for the patient with me after discharge for more detailed discussion of his treatment options in the absence of Dr. Chryl Heck who is currently on maternity leave. For the back pain, the patient may benefit from palliative radiotherapy to the painful metastatic lesions.  The patient voices understanding of current disease status and treatment options and is in agreement with the current care plan.  All questions were answered. The patient knows to call the clinic with any problems, questions or concerns. We can certainly see the patient much sooner if necessary.  Thank you so much for allowing me to participate in the care of Robert [name]. I will continue to follow up the patient with you and assist in his care.  Disclaimer: This note was dictated with voice recognition software. Similar sounding words can inadvertently be transcribed and may not be corrected upon review.   Eilleen Kempf February 10, 2021, 3:51 PM

## 2021-02-10 NOTE — Progress Notes (Signed)
Pt had episode of asymptomatic hypotension with systolic blood pressures in the 90-30'B diastolics in the 49'P. MD Avon Gully paged to make aware. NS bolus ordered 517mL/1 hour. Will reassess BP on completion of bolus.

## 2021-02-11 ENCOUNTER — Inpatient Hospital Stay (HOSPITAL_COMMUNITY): Payer: Medicare Other

## 2021-02-11 LAB — CBC WITH DIFFERENTIAL/PLATELET
Abs Immature Granulocytes: 0.25 10*3/uL — ABNORMAL HIGH (ref 0.00–0.07)
Basophils Absolute: 0.1 10*3/uL (ref 0.0–0.1)
Basophils Relative: 0 %
Eosinophils Absolute: 0.3 10*3/uL (ref 0.0–0.5)
Eosinophils Relative: 1 %
HCT: 34.1 % — ABNORMAL LOW (ref 39.0–52.0)
Hemoglobin: 11 g/dL — ABNORMAL LOW (ref 13.0–17.0)
Immature Granulocytes: 1 %
Lymphocytes Relative: 2 %
Lymphs Abs: 0.4 10*3/uL — ABNORMAL LOW (ref 0.7–4.0)
MCH: 28 pg (ref 26.0–34.0)
MCHC: 32.3 g/dL (ref 30.0–36.0)
MCV: 86.8 fL (ref 80.0–100.0)
Monocytes Absolute: 1.1 10*3/uL — ABNORMAL HIGH (ref 0.1–1.0)
Monocytes Relative: 5 %
Neutro Abs: 19.8 10*3/uL — ABNORMAL HIGH (ref 1.7–7.7)
Neutrophils Relative %: 91 %
Platelets: 182 10*3/uL (ref 150–400)
RBC: 3.93 MIL/uL — ABNORMAL LOW (ref 4.22–5.81)
RDW: 13.4 % (ref 11.5–15.5)
WBC: 21.9 10*3/uL — ABNORMAL HIGH (ref 4.0–10.5)
nRBC: 0 % (ref 0.0–0.2)

## 2021-02-11 LAB — BASIC METABOLIC PANEL
Anion gap: 4 — ABNORMAL LOW (ref 5–15)
BUN: 22 mg/dL (ref 8–23)
CO2: 34 mmol/L — ABNORMAL HIGH (ref 22–32)
Calcium: 9.3 mg/dL (ref 8.9–10.3)
Chloride: 92 mmol/L — ABNORMAL LOW (ref 98–111)
Creatinine, Ser: 0.86 mg/dL (ref 0.61–1.24)
GFR, Estimated: >60 mL/min (ref >60)
Glucose, Bld: 91 mg/dL (ref 70–99)
Potassium: 3 mmol/L — ABNORMAL LOW (ref 3.5–5.1)
Sodium: 130 mmol/L — ABNORMAL LOW (ref 135–145)

## 2021-02-11 IMAGING — MR MR HEAD WO/W CM
13 series · 48 of 48 positions shown · IV contrast (gadavist)
Comparison: None.

CLINICAL DATA: Non-small cell lung cancer, staging

EXAM:
MRI HEAD WITHOUT AND WITH CONTRAST
TECHNIQUE: Multiplanar, multiecho pulse sequences of the brain and surrounding
structures were obtained without and with intravenous contrast.
CONTRAST:  10mL GADAVIST GADOBUTROL 1 MMOL/ML IV SOLN

[Series 5: DWI · axial · 3.0mm · 1.36mm/px · z∈[+9,+157]mm · 7 of 104 slices shown (1 of 2)]
[im 1/104]
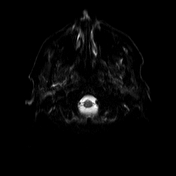
[im 18/104]
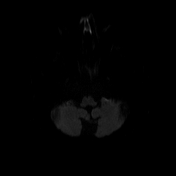
[im 35/104]
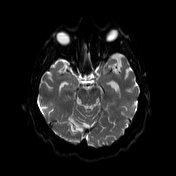
[im 52/104]
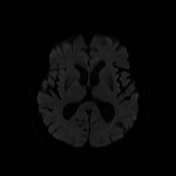
[im 69/104]
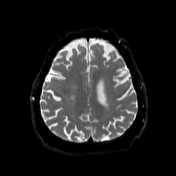
[im 86/104]
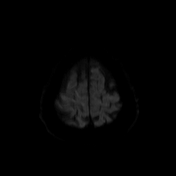
[im 104/104]
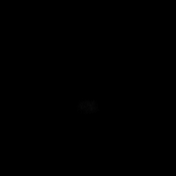

[Series 6: DWI · axial · 3.0mm · 1.36mm/px · z∈[+9,+157]mm · 3 of 52 slices shown (2 of 2)]
[im 1/52]
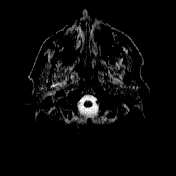
[im 26/52]
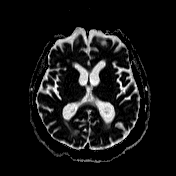
[im 52/52]
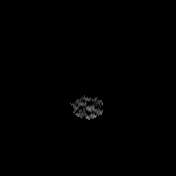

[Series 7: T1 · sagittal · 5.0mm · 0.75mm/px · 1 of 24 slices shown (1 of 2)]
[im 1/24]
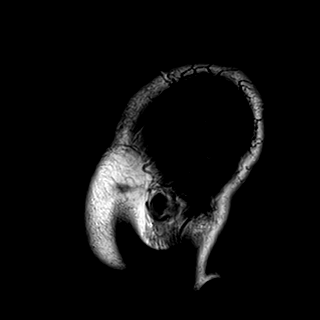

[Series 8: T2 · axial · 5.0mm · 0.62mm/px · z∈[+4,+161]mm · 2 of 26 slices shown (1 of 2)]
[im 1/26]
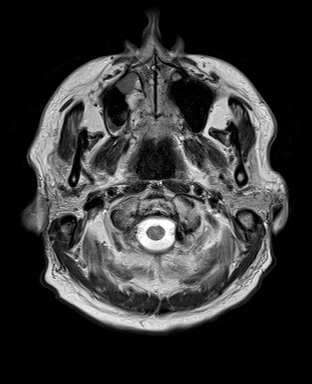
[im 26/26]
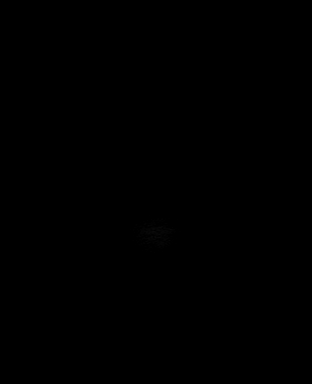

[Series 9: swi_images · axial · 3.0mm · 0.75mm/px · z∈[+9,+156]mm · 3 of 52 slices shown]
[im 1/52]
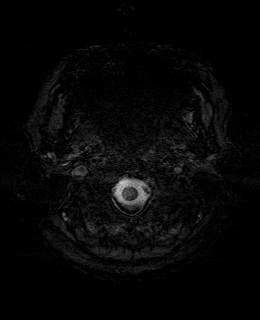
[im 26/52]
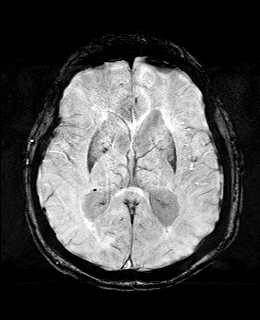
[im 52/52]
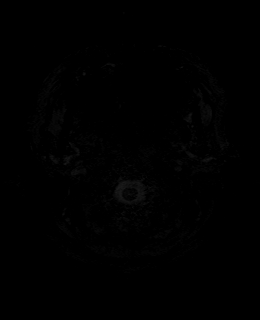

[Series 11: FLAIR · axial · 3.0mm · 0.75mm/px · z∈[+9,+156]mm · 3 of 52 slices shown]
[im 1/52]
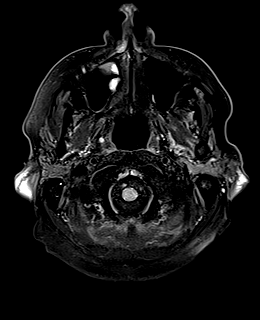
[im 26/52]
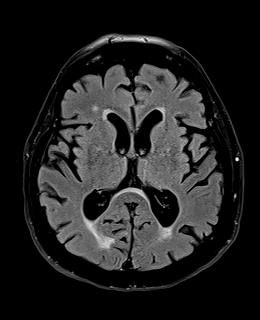
[im 52/52]
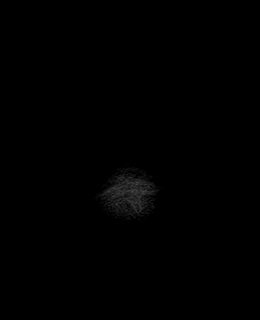

[Series 12: T1 · axial · 1.0mm · 0.94mm/px · z∈[+13,+152]mm · 9 of 144 slices shown (2 of 2)]
[im 1/144]
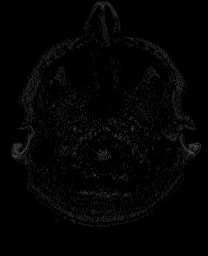
[im 18/144]
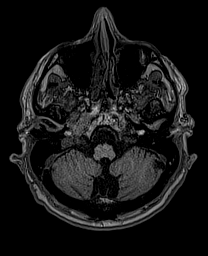
[im 36/144]
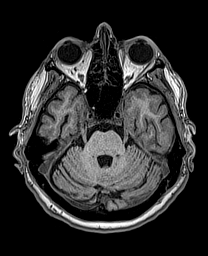
[im 54/144]
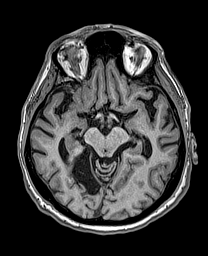
[im 72/144]
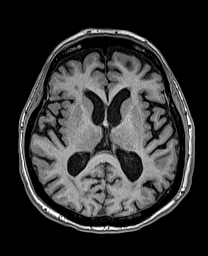
[im 90/144]
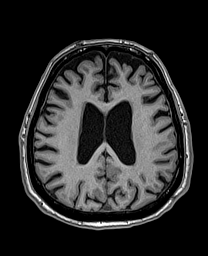
[im 108/144]
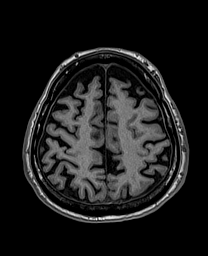
[im 126/144]
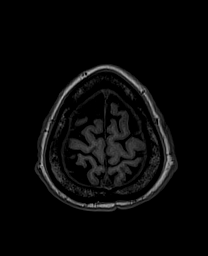
[im 144/144]
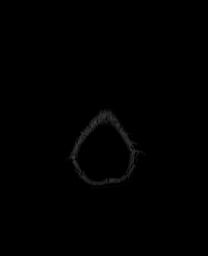

[Series 13: cor dwi_tracew · coronal · 5.0mm · 1.53mm/px · 3 of 52 slices shown]
[im 1/52]
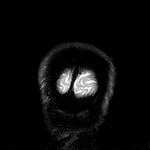
[im 26/52]
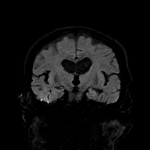
[im 52/52]
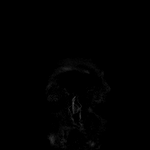

[Series 14: cor dwi_adc · coronal · 5.0mm · 1.53mm/px · 2 of 26 slices shown]
[im 1/26]
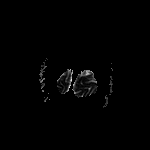
[im 26/26]
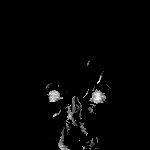

[Series 15: T2 · coronal · 5.0mm · 0.57mm/px · 2 of 33 slices shown (2 of 2)]
[im 1/33]
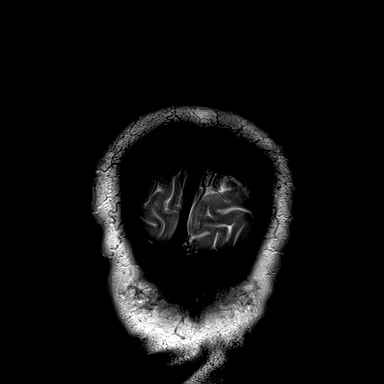
[im 33/33]
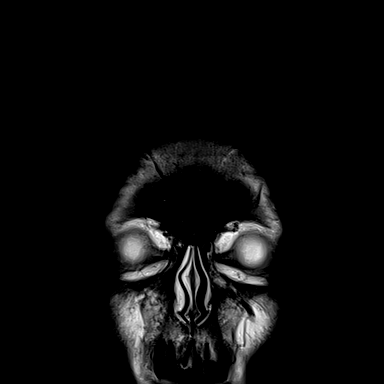

[Series 21: T1 post-contrast · axial · 1.0mm · 0.94mm/px · z∈[-23,+131]mm · 10 of 160 slices shown (1 of 3)]
[im 1/160]
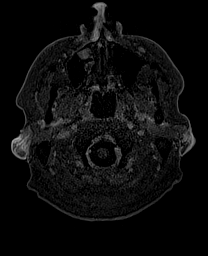
[im 18/160]
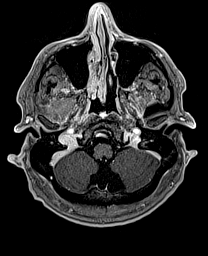
[im 36/160]
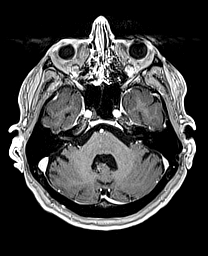
[im 54/160]
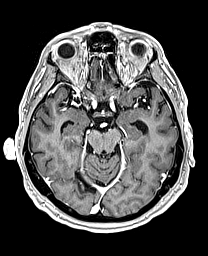
[im 71/160]
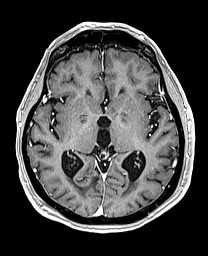
[im 89/160]
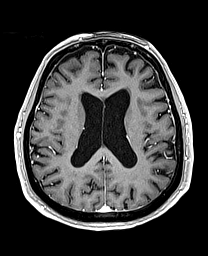
[im 107/160]
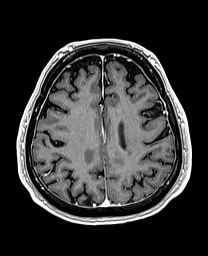
[im 124/160]
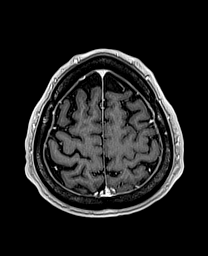
[im 142/160]
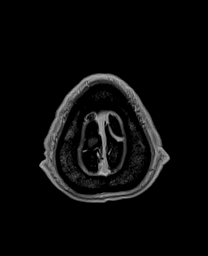
[im 160/160]
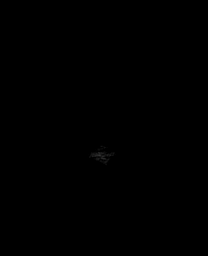

[Series 22: T1 post-contrast · coronal · 5.0mm · 0.43mm/px · 2 of 28 slices shown (2 of 3)]
[im 1/28]
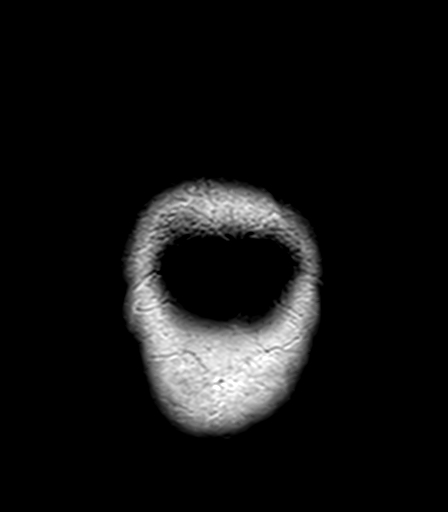
[im 28/28]
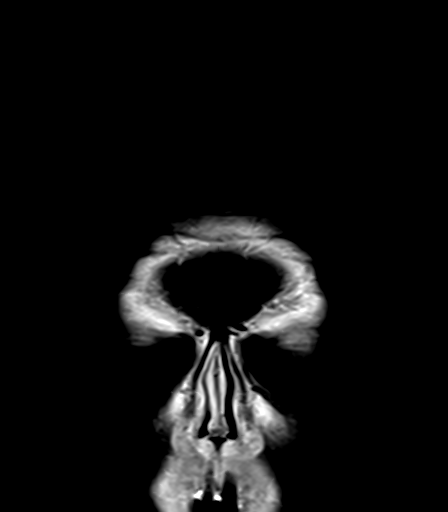

[Series 23: T1 post-contrast · sagittal · 5.0mm · 0.75mm/px · 1 of 24 slices shown (3 of 3)]
[im 1/24]
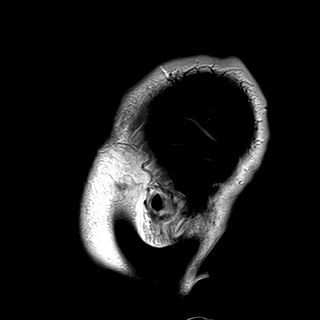

[48 of 48 positions shown; findings below may reference images not displayed]

FINDINGS: Brain: Encephalomalacia in the right occipital lobe and right para
hippocampal region with surrounding gliosis, compatible with remote
right PCA territory infarct. Additional mild scattered T2
hyperintensities in the white matter, nonspecific but compatible
with chronic microvascular ischemic disease. Mild for age
generalized atrophy with ex vacuo ventricular dilation. No evidence
of acute infarct, hydrocephalus, acute hemorrhage, mass lesion,
midline shift, or extra-axial fluid collection. No abnormal
enhancement.

Vascular: Major arterial flow voids are maintained at the skull
base. Small/non dominant right vertebral artery, which appears
patent on post-contrast imaging.

Skull and upper cervical spine: Normal marrow signal.

Sinuses/Orbits: Mild paranasal sinus mucosal thickening.
Unremarkable orbits.

Other: No sizable mastoid effusions.
IMPRESSION: 1. No evidence of acute intracranial abnormality or metastatic
disease.
2. Remote right PCA territory infarct.
3. Mild for age chronic microvascular ischemic disease and atrophy.

## 2021-02-11 IMAGING — MR MR ABDOMEN WO/W CM
18 of 19 series · 47 of 48 positions shown · IV contrast (gadavist)
Comparison: None.

EXAM:
MRI ABDOMEN WITHOUT AND WITH CONTRAST
TECHNIQUE: Multiplanar multisequence MR imaging of the abdomen was performed
both before and after the administration of intravenous contrast.

CONTRAST:  10mL GADAVIST GADOBUTROL 1 MMOL/ML IV SOLN

[Series 3: T2 · coronal · 6.0mm · 1.56mm/px · 2 of 35 slices shown (1 of 2)]
[im 1/35]
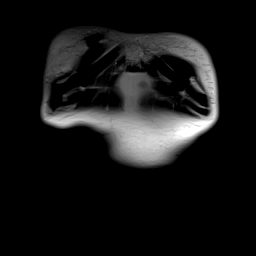
[im 35/35]
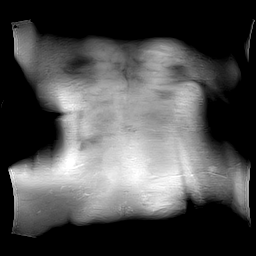

[Series 4: T2 · axial · 6.0mm · 1.56mm/px · z∈[-115,+151]mm · 2 of 38 slices shown (2 of 2)]
[im 1/38]
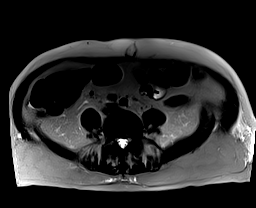
[im 38/38]
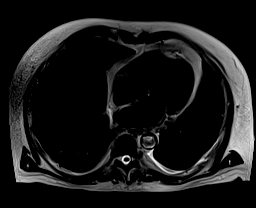

[Series 6: T2 fat-sat · axial · 6.0mm · 1.25mm/px · 1 of 38 slices shown]
[im 1/38]
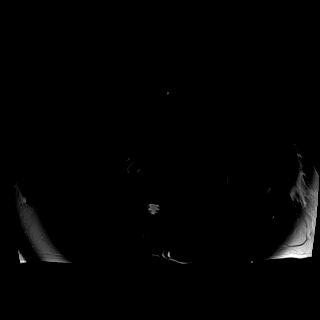

[Series 7: T1 · axial · 3.0mm · 1.25mm/px · z∈[-111,+150]mm · 3 of 88 slices shown (1 of 2)]
[im 1/88]
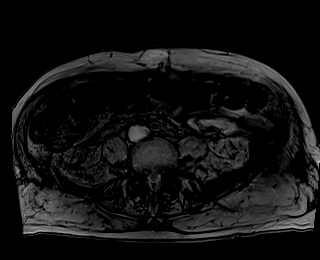
[im 44/88]
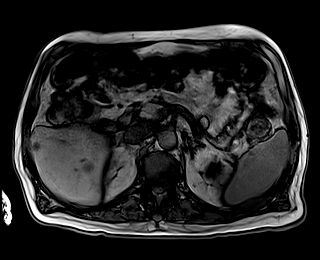
[im 88/88]
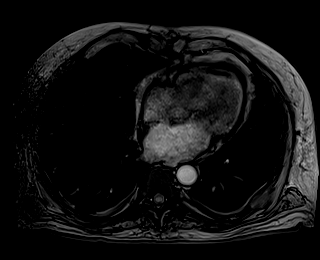

[Series 8: T1 · axial · 3.0mm · 1.25mm/px · z∈[-111,+150]mm · 3 of 88 slices shown (2 of 2)]
[im 1/88]
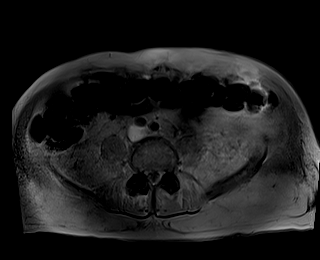
[im 44/88]
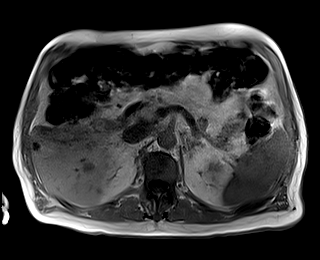
[im 88/88]
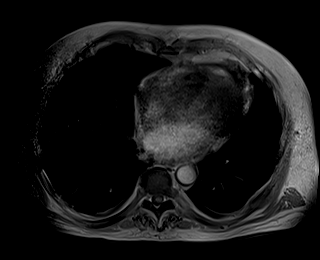

[Series 10: T1 dynamic · axial · 3.0mm · 1.25mm/px · z∈[-113,+148]mm · 3 of 88 slices shown (1 of 10)]
[im 1/88]
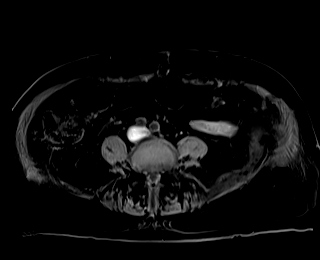
[im 44/88]
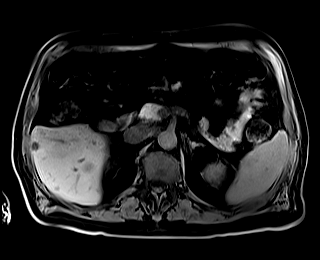
[im 88/88]
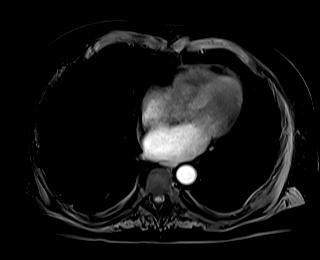

[Series 11: DWI · axial · 6.0mm · 1.49mm/px · z∈[-115,+151]mm · 3 of 76 slices shown (1 of 2)]
[im 1/76]
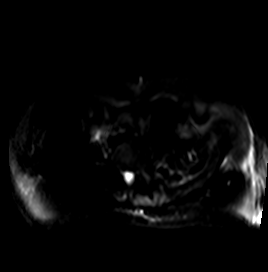
[im 38/76]
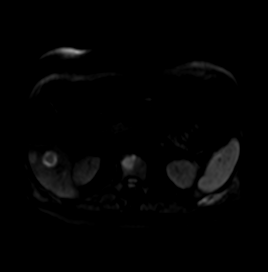
[im 76/76]
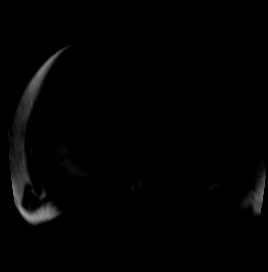

[Series 12: DWI · axial · 6.0mm · 1.49mm/px · 1 of 38 slices shown (2 of 2)]
[im 1/38]
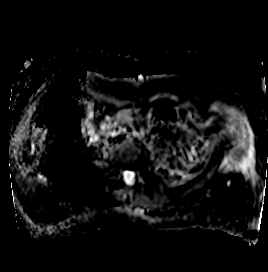

[Series 13: bSSFP · axial · 4.0mm · 0.84mm/px · z∈[-53,+163]mm · 2 of 55 slices shown]
[im 1/55]
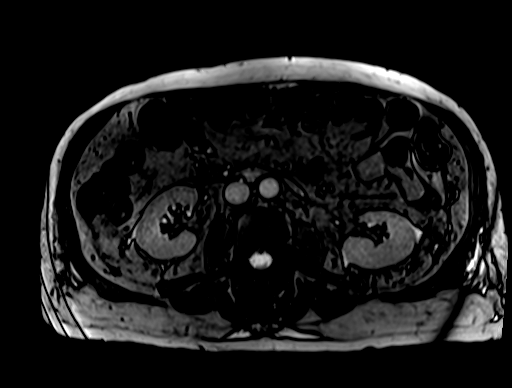
[im 55/55]
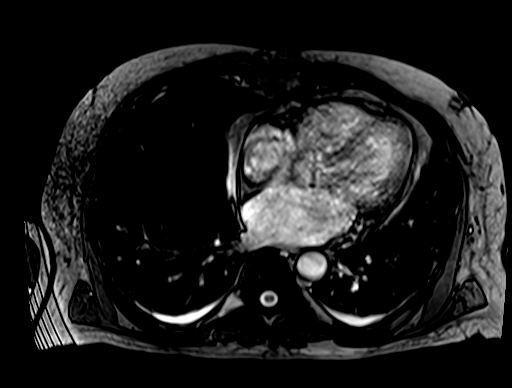

[Series 17: T1 dynamic · axial · 3.0mm · 1.25mm/px · z∈[-113,+148]mm · 3 of 88 slices shown (2 of 10)]
[im 1/88]
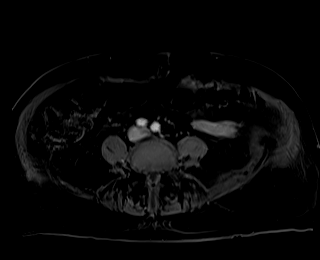
[im 44/88]
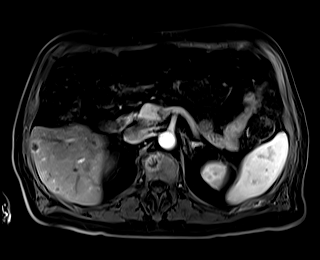
[im 88/88]
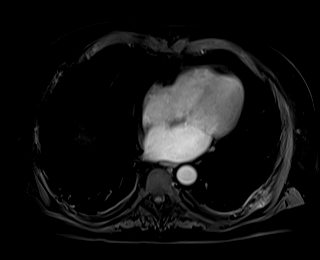

[Series 18: T1 dynamic · axial · 3.0mm · 1.25mm/px · z∈[-113,+148]mm · 3 of 88 slices shown (3 of 10)]
[im 1/88]
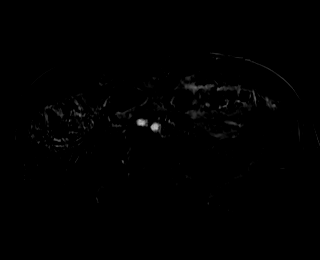
[im 44/88]
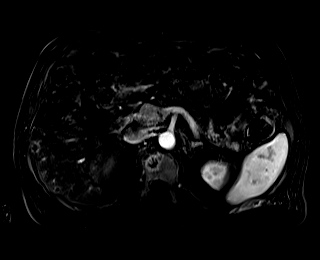
[im 88/88]
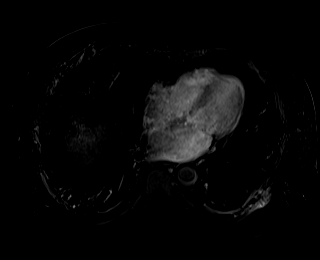

[Series 21: T1 dynamic · axial · 3.0mm · 1.25mm/px · z∈[-113,+148]mm · 3 of 88 slices shown (4 of 10)]
[im 1/88]
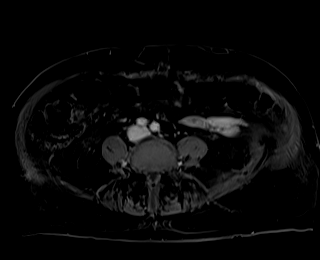
[im 44/88]
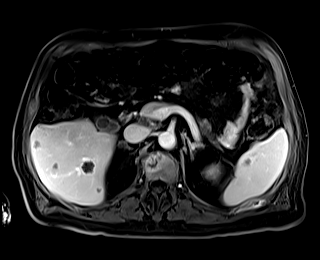
[im 88/88]
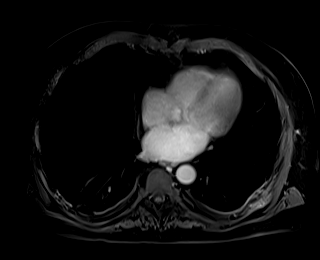

[Series 22: T1 dynamic · axial · 3.0mm · 1.25mm/px · z∈[-113,+148]mm · 3 of 88 slices shown (5 of 10)]
[im 1/88]
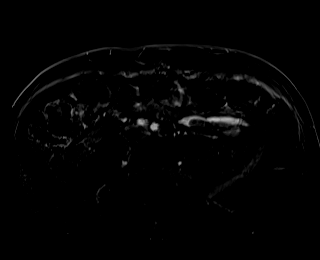
[im 44/88]
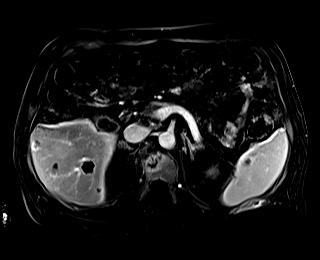
[im 88/88]
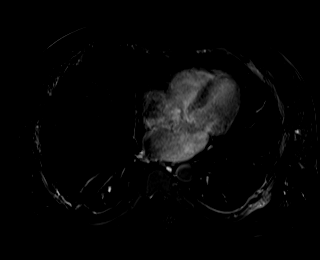

[Series 25: T1 dynamic · axial · 3.0mm · 1.25mm/px · z∈[-113,+148]mm · 3 of 88 slices shown (6 of 10)]
[im 1/88]
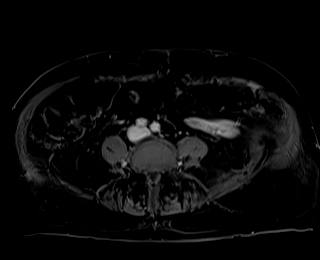
[im 44/88]
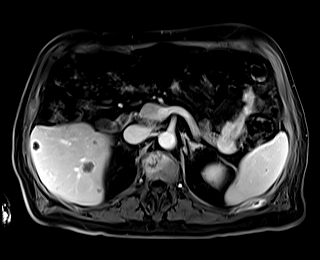
[im 88/88]
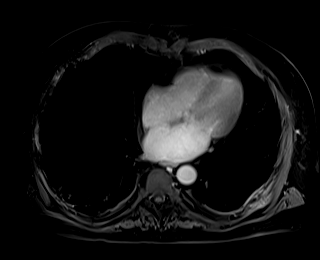

[Series 26: T1 dynamic · axial · 3.0mm · 1.25mm/px · z∈[-113,+148]mm · 3 of 88 slices shown (7 of 10)]
[im 1/88]
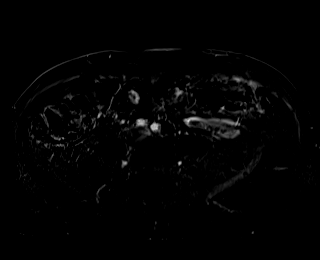
[im 44/88]
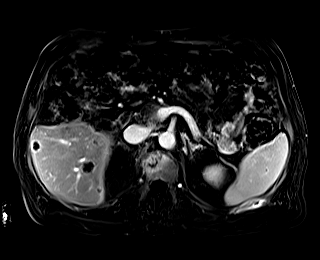
[im 88/88]
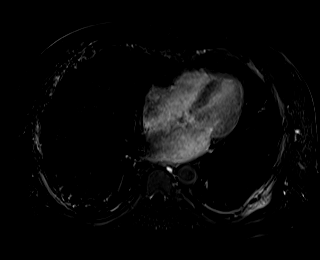

[Series 28: T1 dynamic · coronal · 3.0mm · 1.41mm/px · 3 of 80 slices shown (8 of 10)]
[im 1/80]
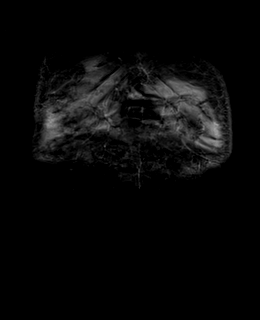
[im 40/80]
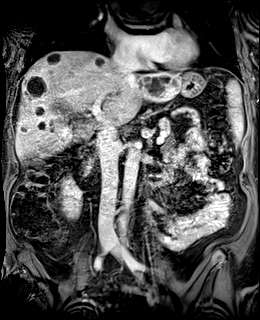
[im 80/80]
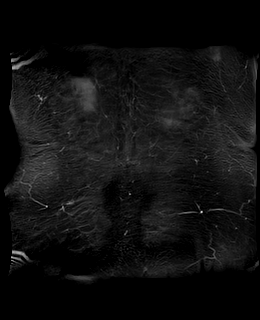

[Series 31: T1 dynamic · axial · 3.0mm · 1.25mm/px · z∈[-113,+148]mm · 3 of 88 slices shown (9 of 10)]
[im 1/88]
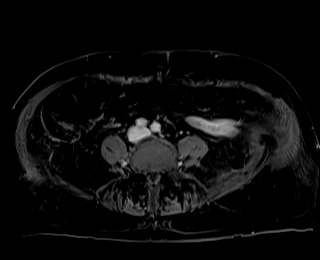
[im 44/88]
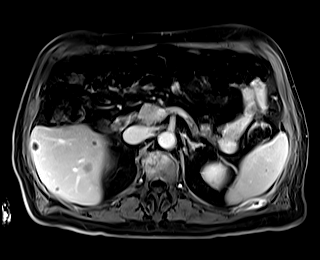
[im 88/88]
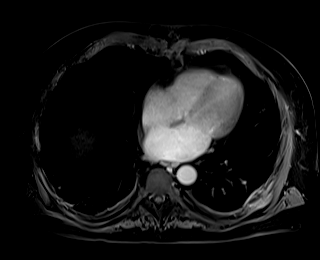

[Series 32: T1 dynamic · axial · 3.0mm · 1.25mm/px · z∈[-113,+148]mm · 3 of 88 slices shown (10 of 10)]
[im 1/88]
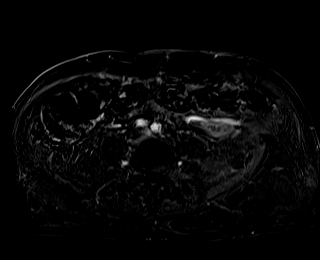
[im 44/88]
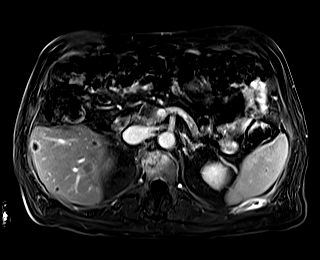
[im 88/88]
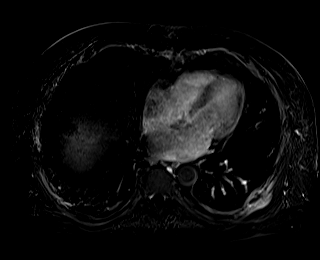

[47 of 48 positions shown; findings below may reference images not displayed]

FINDINGS: Lower chest:  Lung bases are clear.

Hepatobiliary: Multiple round cystic lesions LEFT hepatic lobe.
These are complex cystic lesions with small peripheral crypts
bordering the cystic complexes. These are hyperintense on T2
weighted imaging and low signal intensity on T1 weighted imaging.
Lesions have thin peripheral enhancement. For example lesion in the
RIGHT hepatic lobe measuring 4.2 by 3.2 cm (image 17/series 17).
There is a conglomerate of cystic enhancing lesions in the lateral
segment LEFT hepatic lobe measuring 5.9 x 4.3 cm with SUV max on
image 18/21. Similar cystic lesion with multiple small satellite
cysts in the posterior RIGHT hepatic lobe measuring 3 cm on image
16/21.

Pancreas: Normal pancreatic parenchymal intensity. No ductal
dilatation or inflammation.

Spleen: Similar cystic lesion with peripheral enhancement in the
spleen measuring 18 mm on image 35/series 21.

Adrenals/urinary tract: Peripheral enhancing lesion of the adrenal
gland measuring 2.4 cm (image 38/series 21.

Stomach/Bowel: Stomach and limited of the small bowel is
unremarkable

Vascular/Lymphatic: Abdominal aortic normal caliber. No
retroperitoneal periportal lymphadenopathy.

Musculoskeletal: There is an enhancing lesion in the L1 vertebral
body (image 36/21. Small enhancing lesion in lower lumbar spine on
image 73/21. Enlarged lesion of the posterior LEFT rib measuring 18
x 24 mm on image 40 [DATE].
IMPRESSION: 1. Unusual pattern of peripheral enhancing cystic complexes within
LEFT and RIGHT hepatic lobe. Differential includes cystic metastatic
disease versus multifocal hepatic abscesses. With the multiplicity
of lesion would favor hepatic metastasis.
2. Similar cystic lesion within the spleen favor cystic metastasis.
3. Indeterminate lesion of the RIGHT adrenal gland concern for RIGHT
adrenal metastasis.
4. Expansile lesion in the posterior LEFT rib is suggestive of
metastatic lesion. Enhancing lesions within the spine lumbar spine
are also concerning for metastatic lesions.

## 2021-02-11 MED ORDER — POTASSIUM CHLORIDE CRYS ER 20 MEQ PO TBCR
40.0000 meq | EXTENDED_RELEASE_TABLET | Freq: Every day | ORAL | Status: DC
  Administered 2021-02-11 – 2021-02-12 (×2): 40 meq via ORAL
  Filled 2021-02-11 (×2): qty 2

## 2021-02-11 MED ORDER — GADOBUTROL 1 MMOL/ML IV SOLN
10.0000 mL | Freq: Once | INTRAVENOUS | Status: AC | PRN
  Administered 2021-02-11: 10 mL via INTRAVENOUS

## 2021-02-11 NOTE — Evaluation (Signed)
Physical Therapy Evaluation Patient Details Name: Robert [name] MRN: [id number] DOB: Feb 10, 1941 Today's Date: 02/11/2021  History of Present Illness  80 y.o. male who is coming to the emergency department with progressively worse back pain for the past 2 weeks, imaging  concerning for metastatic disease to lumbar spine, L scapula, pelvis, liver, lung likely primary. 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 Pt ambulated 180' with RW, no loss of balance. He reports his biggest challenge is getting out of bed. Instructed pt in log rolling technique. RW recommended for home use.  Pt currently with functional limitations due to the deficits listed below (see PT Problem List). Pt will benefit from skilled PT to increase their independence and safety with mobility to allow discharge to the venue listed below.          Recommendations for follow up therapy are one component of a multi-disciplinary discharge planning process, led by the attending physician.  Recommendations may be updated based on patient status, additional functional criteria and insurance authorization.  Follow Up Recommendations No PT follow up    Assistance Recommended at Discharge None  Functional Status Assessment Patient has had a recent decline in their functional status and demonstrates the ability to make significant improvements in function in a reasonable and predictable amount of time.  Equipment Recommendations  Rolling walker (2 wheels)    Recommendations for Other Services       Precautions / Restrictions Precautions Precautions: Fall Restrictions Weight Bearing Restrictions: No      Mobility  Bed Mobility Overal bed mobility: Needs Assistance Bed Mobility: Supine to Sit     Supine to sit: Min assist     General bed mobility comments: min A to raise  trunk    Transfers Overall transfer level: Needs assistance Equipment used: Rolling walker (2 wheels) Transfers: Sit to/from Stand Sit to Stand: Supervision           General transfer comment: VCs hand placement    Ambulation/Gait Ambulation/Gait assistance: Min guard Gait Distance (Feet): 180 Feet Assistive device: Rolling walker (2 wheels) Gait Pattern/deviations: WFL(Within Functional Limits) Gait velocity: WNL   General Gait Details: steady with RW, no loss of balance  Stairs            Wheelchair Mobility    Modified Rankin (Stroke Patients Only)       Balance Overall balance assessment: Modified Independent                                           Pertinent Vitals/Pain Pain Assessment: 0-10 Pain Score: 3  Pain Location: back Pain Descriptors / Indicators: Sore Pain Intervention(s): Limited activity within patient's tolerance;Monitored during session;Premedicated before session    Home Living Family/patient expects to be discharged to:: Private residence Living Arrangements: Spouse/significant other Available Help at Discharge: Family;Available 24 hours/day Type of Home: House Home Access: Stairs to enter   [employer] of Steps: 1   Home Layout: One level Home Equipment: None      Prior Function Prior Level of Function : Independent/Modified Independent             Mobility Comments: plays golf, works in yard, no AD       Higher education careers adviser  Extremity/Trunk Assessment   Upper Extremity Assessment Upper Extremity Assessment: Overall WFL for tasks assessed    Lower Extremity Assessment Lower Extremity Assessment: Overall WFL for tasks assessed    Cervical / Trunk Assessment Cervical / Trunk Assessment: Normal  Communication   Communication: Expressive difficulties (speech somewhat slurred 2* h/o oral cancer)  Cognition Arousal/Alertness: Awake/alert Behavior During Therapy: WFL for tasks  assessed/performed Overall Cognitive Status: Within Functional Limits for tasks assessed                                          General Comments      Exercises     Assessment/Plan    PT Assessment Patient needs continued PT services  PT Problem List Decreased mobility;Pain       PT Treatment Interventions Functional mobility training;Therapeutic exercise;Patient/family education;Therapeutic activities    PT Goals (Current goals can be found in the Care Plan section)  Acute Rehab PT Goals Patient Stated Goal: golf, yard work PT Goal Formulation: With patient/family Time For Goal Achievement: 02/25/21 Potential to Achieve Goals: Good    Frequency Min 3X/week   Barriers to discharge        Co-evaluation               AM-PAC PT "6 Clicks" Mobility  Outcome Measure Help needed turning from your back to your side while in a flat bed without using bedrails?: None Help needed moving from lying on your back to sitting on the side of a flat bed without using bedrails?: A Little Help needed moving to and from a bed to a chair (including a wheelchair)?: None Help needed standing up from a chair using your arms (e.g., wheelchair or bedside chair)?: None Help needed to walk in hospital room?: None Help needed climbing 3-5 steps with a railing? : None 6 Click Score: 23    End of Session Equipment Utilized During Treatment: Gait belt Activity Tolerance: Patient tolerated treatment well Patient left: in chair;with call bell/phone within reach;with family/visitor present Nurse Communication: Mobility status PT Visit Diagnosis: Other abnormalities of gait and mobility (R26.89);Pain    Time: 3086-5784 PT Time Calculation (min) (ACUTE ONLY): 22 min   Charges:   PT Evaluation $PT Eval Moderate Complexity: 1 Mod         Tamala Ser PT 02/11/2021  Acute Rehabilitation Services Pager [phone] Office [phone]

## 2021-02-11 NOTE — Consult Note (Addendum)
Chief Complaint: Patient was seen in consultation today for CT-guided biopsy of right rib lesion/soft tissue component  Chief Complaint  Patient presents with   Back Pain    Referring Physician(s): [place] Physician: Owens Shark  Patient Status: [facility] [place] - In-pt  History of Present Illness: Robert [name] is a 80 y.o. 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 Isidore Moos. The patient presented recently with evidence of metastatic disease involving the lung as well as mediastinal lymphadenopathy and multiple suspicious liver lesions as well as multiple osseous metastases and right adrenal suspicious metastatic disease.  Request now received from oncology for image guided biopsy of right rib lesion/soft tissue component for further evaluation.  Past Medical History:  Diagnosis Date   A-fib ([place])    HTN (hypertension)     Past Surgical History:  Procedure Laterality Date   DENTAL SURGERY      Allergies: Patient has no known allergies.  Medications: Prior to Admission medications   Medication Sig Start Date End Date Taking? Authorizing Provider  docusate sodium (COLACE) 100 MG capsule Take 100 mg by mouth 2 (two) times daily as needed for mild constipation.   Yes [provider]  metoprolol tartrate (LOPRESSOR) 100 MG tablet Take 100 mg by mouth 2 (two) times daily. 07/26/18  Yes [provider]  rivaroxaban (XARELTO) 20 MG TABS tablet Take 1 tablet (20 mg total) by mouth daily with supper. 09/19/20  Yes Adrian Prows, MD     Family History  Problem Relation Age of Onset   COPD Mother        Heavy smoker for 57 years   Hypertension Father         uncontrolled    Social History   Socioeconomic History   Marital status: Married    Spouse name: Not on file   Number of children: 2   Years of education: Not on file   Highest education level: Not on file  Occupational History   Not on file  Tobacco Use   Smoking status: Never   Smokeless tobacco: Former    Types: Nurse, children's Use: Former  Substance and Sexual Activity   Alcohol use: Yes    Comment: occ   Drug use: Never   Sexual activity: Not on file  Other Topics Concern   Not on file  Social History Narrative   Not on file   Social Determinants of Health   Financial Resource Strain: Low Risk    Difficulty of Paying Living Expenses: Not hard at all  Food Insecurity: No Food Insecurity   Worried About Charity fundraiser in the Last Year: Never true   [place] in the Last Year: Never true  Transportation Needs: No Transportation Needs   Lack of Transportation (Medical): No   Lack of Transportation (Non-Medical): No  Physical Activity: Not on file  Stress: Not on file  Social Connections: Moderately Integrated   Frequency of Communication with Friends and Family: Twice a week   Frequency of Social Gatherings with Friends and Family: Once a week   Attends Religious Services: Never   Marine scientist or Organizations: No   Attends Archivist  Meetings: 1 to 4 times per year   Marital Status: Married      Review of Systems currently denies fever, headache, chest pain, worsening dyspnea, abdominal pain, nausea, vomiting or bleeding.  He does have some slurred speech,  occasional sputum/phlegm production and back pain.  Vital Signs: BP 113/73   Pulse (!) 105   Temp 98.2 F (36.8 C) (Oral)   Resp 20   Ht 6' (1.829 m)   Wt 216 lb 0.8 oz (98 kg)   SpO2 95%   BMI 29.30 kg/m   Physical Exam awake, alert.  Chest clear to auscultation bilaterally.  Heart with irregularly irregular rhythm.  Abdomen soft, positive bowel sounds,  nontender.  No lower extremity edema.  Imaging: DG Lumbar Spine Complete  Result Date: 02/08/2021 CLINICAL DATA:  Low back pain extending into the left flank for 3 days appeared EXAM: LUMBAR SPINE - COMPLETE 4+ VIEW COMPARISON:  None. FINDINGS: Alignment is within normal limits. Moderate disc height loss at L5-S1. Mild disc height loss at L1-L2 and L2-L3. advanced facet degenerative changes seen throughout, greatest at L4-L5. IMPRESSION: No acute abnormality of the lumbar spine. Multilevel degenerative changes as above. Electronically Signed   By: Miachel Roux M.D.   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 CONTRAST:  113mL OMNIPAQUE IOHEXOL 350 MG/ML SOLN COMPARISON:  08/15/2020 CT FINDINGS: Pharynx and larynx: Status post resection and treatment of a previously noted enhancing mass arising from the buccal surface of the right lower lip, with focal fat, likely related to reconstruction. No definite residual enhancing mass is seen. Salivary glands: No inflammation, mass, or stone. Thyroid: Unchanged mild enlargement of the thyroid. A previously noted central hypodense lesion is not appreciated on the current exam. Lymph nodes: Status post bilateral neck dissection. No cervical lymphadenopathy. For mediastinal lymph nodes, please see same-day CT chest Vascular: Negative. Limited intracranial: Negative. Visualized orbits: Negative. Mastoids and visualized paranasal sinuses: Mucosal thickening in the right maxillary sinus. Otherwise negative. Skeleton: No acute osseous abnormality in the cervical spine. Status post partial resection  of the mandible Upper chest: Please see same-day CT chest Other: Skin thickening overlying the neck, likely sequela of prior radiation. IMPRESSION: 1. Status post resection of and radiation therapy to an enhancing mass arising from the buccal surface of the right lower lip, with no residual enhancing mass seen. Reconstructive changes. No evidence of local recurrence. 2. Status post bilateral neck dissection. No cervical lymphadenopathy. 3. For findings in the chest, including mediastinal lymph nodes, please see same-day CT chest. Electronically Signed   By: Merilyn Baba M.D.   On: 02/09/2021 20:06   CT CHEST W CONTRAST  Result Date: 02/09/2021 CLINICAL DATA:  Cancer of unknown primary, staging; Metastatic disease evaluation EXAM: CT CHEST, ABDOMEN, AND PELVIS WITH CONTRAST TECHNIQUE: Multidetector CT imaging of the chest, abdomen and pelvis was performed following the standard protocol during bolus administration of intravenous contrast. CONTRAST:  129mL OMNIPAQUE IOHEXOL 350 MG/ML SOLN COMPARISON:  February 08, 2021 FINDINGS: CT CHEST FINDINGS Cardiovascular: Heart is enlarged. Three-vessel coronary artery atherosclerotic calcifications. No pericardial effusion. Atherosclerotic calcifications of the aorta. Mediastinum/Nodes: There is irregular low-density lymph nodes within the AP window  with representative conglomeration of lymph nodes measuring 21 mm (series 3, image 23). Irregular low-density LEFT inferior hilar lymph node versus soft tissue mass measure 16 mm in the short axis (series 3, image 30). Prominent RIGHT paratracheal lymph node measures 10 mm in the short axis (series 3, image 15). Coarsely calcified RIGHT mediastinal lymph nodes.There is a low-density RIGHT hilar lymph node which measures 12 mm in the short axis (series 3, image 30). No axillary adenopathy. Lungs/Pleura: Along the LEFT lower lobe hilum, there is low-density soft tissue which demonstrates mild mass effect on the adjacent  bronchus. It tracks along the LEFT main pulmonary artery and spans approximately 32 by 17 mm in representative cross-sectional area (series 6, image 299). There is irregularity of the LEFT bronchus at the bifurcation in the region of this mass, concerning for bronchial wall invasion. No pleural effusion or pneumothorax. Bibasilar enhancing consolidative opacities most consistent with atelectasis. In the peripheral RIGHT upper lobe, there is a wedge-shaped opacity with scattered internal lucency which measures 22 mm (series 5, image 32). No large central pulmonary embolism. There is a rounded pulmonary nodule of the LEFT upper lobe which measures 8 x 6 mm (series 5, image 61). Musculoskeletal: There is an oval nodule in the upper inner RIGHT breast which measures 9 mm (series 3, image 30). There is osseous destruction of the sternum with adjacent soft tissue mass most consistent with osseous metastatic disease. There is osseous destruction of the RIGHT posterolateral sixth rib with a pathologic fracture and associated soft tissue mass. Osseous destruction of the posterolateral LEFT eighth and eleventh there is a lytic lesion of T12 which extends into the RIGHT posterior element rib. Osseous destruction with associated soft tissue mass at the LEFT scapula. CT ABDOMEN PELVIS FINDINGS Hepatobiliary: There are innumerable hypodense masses throughout the liver. Many of these masses demonstrate fluid attenuation and likely reflect hepatic cysts. However several are indeterminate and may reflect metastatic disease. Representative indeterminate mass in the RIGHT liver measures 22 mm (series 3, image 71). Mass in the LEFT liver is irregular in appearance and is indeterminate; it spans 38 mm (series 3, image 50). Portal vein is patent. Gallbladder is distended with likely cholelithiasis. Pancreas: Unremarkable. No pancreatic ductal dilatation or surrounding inflammatory changes. Spleen: There are multiple hypodense masses in  the spleen, nonspecific. Multiple punctate calcifications throughout the spleen consistent with sequela of prior granulomatous infection. Adrenals/Urinary Tract: There is an indeterminate 24 mm RIGHT adrenal nodule. The kidneys enhance symmetrically. No hydronephrosis. No obstructive nephrolithiasis. Bladder is circumferentially thick-walled, most consistent with sequela of chronic outlet obstruction. Stomach/Bowel: No evidence of bowel obstruction. Appendix is normal. Mild wall thickening of the terminal ileum. Vascular/Lymphatic: Atherosclerotic calcifications. No suspicious lymphadenopathy visualized within the pelvis or retroperitoneum. Reproductive: Prostate is present. Other: Trace free fluid in the pelvis. Musculoskeletal: Lytic lesions involving L1, L3, the LEFT iliac crest, LEFT iliac adjacent to the SI joint, LEFT sacrum, and RIGHT iliac bone. IMPRESSION: 1. There is an irregular masslike opacity along the LEFT lower lobe hilar border with mild mass effect and apparent invasion of the bronchus at this site. Findings are concerning for primary lung malignancy. 2. Irregular low-density mediastinal and bilateral hilar adenopathy are concerning for metastatic nodal disease. 3. Innumerable hypodense masses throughout the liver, many of which are cysts but several which are indeterminate and may reflect hepatic metastatic disease. These could be further characterized with dedicated MRI liver with contrast when and if clinically appropriate. 4. Indeterminate RIGHT adrenal nodule, suspicious for  metastatic disease. This could be further assessed with MRI if clinically indicated. 5. There is osseous metastatic disease involving multiple vertebral bodies in the thoracic and lumbar spine, bilateral ribs, the LEFT scapula, the sternum and the LEFT greater than RIGHT pelvis. 6. Wedge shape opacity of the RIGHT upper lobe with suggestion of internal lucency. Findings could be infectious or inflammatory in etiology.  Given wedge-shaped appearance, differential considerations include pulmonary infarction. No central pulmonary embolism within the limitations of this exam. 7. Additional pulmonary nodule within the LEFT upper lobe is indeterminate and may reflect additional site of disease. 8. Nonspecific oval mass in the RIGHT breast. This could be further assessed with dedicated diagnostic mammogram and ultrasound if deemed clinically necessary. 9. Mild wall thickening of the terminal ileum, nonspecific. 10. Scattered hypodense masses throughout the spleen, nonspecific. Aortic Atherosclerosis (ICD10-I70.0). Electronically Signed   By: Valentino Saxon M.D.   On: 02/09/2021 20:09   MR LUMBAR SPINE WO CONTRAST  Result Date: 02/08/2021 CLINICAL DATA:  Low back pain, cancer suspected; technologist note states low back pain and bilateral leg weakness EXAM: MRI LUMBAR SPINE WITHOUT CONTRAST TECHNIQUE: Multiplanar, multisequence MR imaging of the lumbar spine was performed. No intravenous contrast was administered. COMPARISON:  None. FINDINGS: Segmentation:  Standard. Alignment:  No significant listhesis. Vertebrae: Vertebral body heights are maintained. There is patchy STIR hyperintensity at T12 and L1 including involvement of the right T12 pedicle with there may be some extraosseous extension. Patchy STIR hyperintensity at L3 eccentric to the right. Partially imaged possible STIR hyperintensity within the right sacrum. Overall decreased T1 marrow signal. Conus medullaris and cauda equina: Conus extends to the T12-L1 level. Conus and cauda equina appear normal. Paraspinal and other soft tissues: Unremarkable. Disc levels: L1-L2: Disc bulge eccentric to the right. No canal or foraminal stenosis. L2-L3: Disc bulge with endplate osteophytes. Mild facet arthropathy with ligamentum flavum infolding. No canal or foraminal stenosis. L3-L4: Disc bulge with endplate osteophytes. Mild facet arthropathy with ligamentum flavum infolding. No  canal or foraminal stenosis. L4-L5: Disc bulge with endplate osteophytic ridging. Marked facet arthropathy with ligamentum flavum infolding. Minor canal stenosis. Effacement of subarticular recesses. Mild right foraminal stenosis with facet spurring contacting the exiting nerve root. Mild left foraminal stenosis. L5-S1: Marked disc height loss with probable partial bridging. Disc bulge with endplate osteophytic ridging. Moderate facet arthropathy. No canal stenosis. Minor foraminal stenosis. IMPRESSION: Overall abnormal marrow signal with areas of more discrete abnormality. Suspect metastatic disease. Multilevel degenerative changes without high-grade stenosis. Electronically Signed   By: Macy Mis M.D.   On: 02/08/2021 12:38   CT ABDOMEN PELVIS W CONTRAST  Result Date: 02/09/2021 CLINICAL DATA:  Cancer of unknown primary, staging; Metastatic disease evaluation EXAM: CT CHEST, ABDOMEN, AND PELVIS WITH CONTRAST TECHNIQUE: Multidetector CT imaging of the chest, abdomen and pelvis was performed following the standard protocol during bolus administration of intravenous contrast. CONTRAST:  142mL OMNIPAQUE IOHEXOL 350 MG/ML SOLN COMPARISON:  February 08, 2021 FINDINGS: CT CHEST FINDINGS Cardiovascular: Heart is enlarged. Three-vessel coronary artery atherosclerotic calcifications. No pericardial effusion. Atherosclerotic calcifications of the aorta. Mediastinum/Nodes: There is irregular low-density lymph nodes within the AP window with representative conglomeration of lymph nodes measuring 21 mm (series 3, image 23). Irregular low-density LEFT inferior hilar lymph node versus soft tissue mass measure 16 mm in the short axis (series 3, image 30). Prominent RIGHT paratracheal lymph node measures 10 mm in the short axis (series 3, image 15). Coarsely calcified RIGHT mediastinal lymph nodes.There is a low-density RIGHT  hilar lymph node which measures 12 mm in the short axis (series 3, image 30). No axillary  adenopathy. Lungs/Pleura: Along the LEFT lower lobe hilum, there is low-density soft tissue which demonstrates mild mass effect on the adjacent bronchus. It tracks along the LEFT main pulmonary artery and spans approximately 32 by 17 mm in representative cross-sectional area (series 6, image 299). There is irregularity of the LEFT bronchus at the bifurcation in the region of this mass, concerning for bronchial wall invasion. No pleural effusion or pneumothorax. Bibasilar enhancing consolidative opacities most consistent with atelectasis. In the peripheral RIGHT upper lobe, there is a wedge-shaped opacity with scattered internal lucency which measures 22 mm (series 5, image 32). No large central pulmonary embolism. There is a rounded pulmonary nodule of the LEFT upper lobe which measures 8 x 6 mm (series 5, image 61). Musculoskeletal: There is an oval nodule in the upper inner RIGHT breast which measures 9 mm (series 3, image 30). There is osseous destruction of the sternum with adjacent soft tissue mass most consistent with osseous metastatic disease. There is osseous destruction of the RIGHT posterolateral sixth rib with a pathologic fracture and associated soft tissue mass. Osseous destruction of the posterolateral LEFT eighth and eleventh there is a lytic lesion of T12 which extends into the RIGHT posterior element rib. Osseous destruction with associated soft tissue mass at the LEFT scapula. CT ABDOMEN PELVIS FINDINGS Hepatobiliary: There are innumerable hypodense masses throughout the liver. Many of these masses demonstrate fluid attenuation and likely reflect hepatic cysts. However several are indeterminate and may reflect metastatic disease. Representative indeterminate mass in the RIGHT liver measures 22 mm (series 3, image 71). Mass in the LEFT liver is irregular in appearance and is indeterminate; it spans 38 mm (series 3, image 50). Portal vein is patent. Gallbladder is distended with likely  cholelithiasis. Pancreas: Unremarkable. No pancreatic ductal dilatation or surrounding inflammatory changes. Spleen: There are multiple hypodense masses in the spleen, nonspecific. Multiple punctate calcifications throughout the spleen consistent with sequela of prior granulomatous infection. Adrenals/Urinary Tract: There is an indeterminate 24 mm RIGHT adrenal nodule. The kidneys enhance symmetrically. No hydronephrosis. No obstructive nephrolithiasis. Bladder is circumferentially thick-walled, most consistent with sequela of chronic outlet obstruction. Stomach/Bowel: No evidence of bowel obstruction. Appendix is normal. Mild wall thickening of the terminal ileum. Vascular/Lymphatic: Atherosclerotic calcifications. No suspicious lymphadenopathy visualized within the pelvis or retroperitoneum. Reproductive: Prostate is present. Other: Trace free fluid in the pelvis. Musculoskeletal: Lytic lesions involving L1, L3, the LEFT iliac crest, LEFT iliac adjacent to the SI joint, LEFT sacrum, and RIGHT iliac bone. IMPRESSION: 1. There is an irregular masslike opacity along the LEFT lower lobe hilar border with mild mass effect and apparent invasion of the bronchus at this site. Findings are concerning for primary lung malignancy. 2. Irregular low-density mediastinal and bilateral hilar adenopathy are concerning for metastatic nodal disease. 3. Innumerable hypodense masses throughout the liver, many of which are cysts but several which are indeterminate and may reflect hepatic metastatic disease. These could be further characterized with dedicated MRI liver with contrast when and if clinically appropriate. 4. Indeterminate RIGHT adrenal nodule, suspicious for metastatic disease. This could be further assessed with MRI if clinically indicated. 5. There is osseous metastatic disease involving multiple vertebral bodies in the thoracic and lumbar spine, bilateral ribs, the LEFT scapula, the sternum and the LEFT greater than  RIGHT pelvis. 6. Wedge shape opacity of the RIGHT upper lobe with suggestion of internal lucency. Findings could be infectious  or inflammatory in etiology. Given wedge-shaped appearance, differential considerations include pulmonary infarction. No central pulmonary embolism within the limitations of this exam. 7. Additional pulmonary nodule within the LEFT upper lobe is indeterminate and may reflect additional site of disease. 8. Nonspecific oval mass in the RIGHT breast. This could be further assessed with dedicated diagnostic mammogram and ultrasound if deemed clinically necessary. 9. Mild wall thickening of the terminal ileum, nonspecific. 10. Scattered hypodense masses throughout the spleen, nonspecific. Aortic Atherosclerosis (ICD10-I70.0). Electronically Signed   By: Valentino Saxon M.D.   On: 02/09/2021 20:09    Labs:  CBC: Recent Labs    02/08/21 1106 02/09/21 0535 02/10/21 0434 02/11/21 0438  WBC 38.2* 22.9* 13.7* 21.9*  HGB 12.5* 10.9* 11.3* 11.0*  HCT 39.8 35.4* 35.9* 34.1*  PLT 356 235 203 182    COAGS: No results for input(s): INR, APTT in the last 8760 hours.  BMP: Recent Labs    02/08/21 1106 02/09/21 0535 02/10/21 0434 02/11/21 0438  NA 136 137 131* 130*  K 3.8 3.6 2.6* 3.0*  CL 97* 98 90* 92*  CO2 31 34* 33* 34*  GLUCOSE 124* 93 93 91  BUN 28* 16 17 22   CALCIUM 13.4* 11.6* 10.1 9.3  CREATININE 1.26* 0.96 0.88 0.86  GFRNONAA 59* >60 >60 >60    LIVER FUNCTION TESTS: Recent Labs    02/08/21 1106 02/09/21 0535 02/10/21 0434  BILITOT 0.7  --   --   AST 32  --   --   ALT 25  --   --   ALKPHOS 136*  --   --   PROT 8.5*  --   --   ALBUMIN 3.6 2.8* 2.6*    TUMOR MARKERS: No results for input(s): AFPTM, CEA, CA199, CHROMGRNA in the last 8760 hours.  Assessment and Plan: 80 y.o. male with past medical history of atrial fibrillation, hypertension and recurrent head and neck cancer that was initially diagnosed in October 2019 as left buccal squamous cell  carcinoma status post surgical resection with recurrence in May 2022 status post surgical resection with bilateral neck dissection as well as adjuvant radiotherapy under the care of Dr. Isidore Moos. The patient presented recently with evidence of metastatic disease involving the lung as well as mediastinal lymphadenopathy and multiple suspicious liver lesions as well as multiple osseous metastases and right adrenal suspicious metastatic disease.  Request now received from oncology for image guided biopsy of right rib lesion/soft tissue component for further evaluation.Imaging studies have been reviewed by Dr. Annamaria Boots.  Risks and benefits of procedure were discussed with the patient/spouse family including, but not limited to bleeding, infection, damage to adjacent structures or low yield requiring additional tests.  All of the questions were answered and there is agreement to proceed.  Consent signed and in chart.  Procedure scheduled for 10/25  Pt's xarelto currently on hold  Thank you for this interesting consult.  I greatly enjoyed meeting Juniper Snyders and look forward to participating in their care.  A copy of this report was sent to the requesting provider on this date.  Electronically Signed: D. Rowe Robert, PA-C 02/11/2021, 12:31 PM   I spent a total of  25 minutes   in face to face in clinical consultation, greater than 50% of which was counseling/coordinating care for guided biopsy of right rib lesion/soft tissue component

## 2021-02-11 NOTE — Progress Notes (Signed)
PROGRESS NOTE    Robert [name]  ZOX:[id number] DOB: 1941/02/23 DOA: 02/08/2021 PCP: Farris Has, MD   Brief Narrative:  Robert [name] is a 80 y.o. 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 Admitted for pain control and oncology evaluation.  Assessment & Plan:   Acute onset lumbago Acute fracture/traumatic injury less likely Possible metastatic disease in the setting of previous history of oral squamous cell carcinoma Rule out new metastaic disease Continue analgesics as needed - plan for transition to PO meds as tolerated for potential DC in the next 24-48h Oncology consulted - Dr Gwenyth Bouillon following [facility] called - Dr Basilio Cairo aware of the patient CT neck chest abdomen and pelvis shows multiple metastatic processes diffusely, MRI per oncology to further classify liver lesions, pending biopsy of rib lesion on 02/12/2021 Will hold xarelto in anticipation of need for biopsy   Acute symptomatic hypercalcemia, improving Likely malignant in nature given below Calcium downtrending appropriately - currently WNL PTH WNL  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   Leukocytosis, likely reactive in the setting of above No fever, night sweats, rigors or chills. Hold antibiotics -no infectious process suspected Improving with supportive care/fluids/pain control Cultures remain negative  Permanent atrial fibrillation (HCC) CHADSVASc Score of at least 4. DC Xarelto in preparation for biopsy Continue metoprolol.   HTN (hypertension) Continue metoprolol 100 mg p.o. twice daily. Monitor BP and heart rate.   Normocytic anemia Monitor H&H.   Transfuse as needed.     DVT prophylaxis:  Holding xarelto for biopsy as above. Family : Wife updated at bedside Code Status:  Full code.  Status is: inpt  Dispo: The patient is from: home             Anticipated d/c is to: home             Anticipated d/c date is: 24-48h             Patient currently medically stable for discharge  Consultants:  Oncology, rads oncology  Procedures:  None  Antimicrobials:  None   Subjective: No acute issues/events overnight, hypotension resolved, patient requesting discharge today but we discussed need for further imaging and biopsy prior to discharge given deleting treatment if he were to leave.  Objective: Vitals:   02/10/21 1900 02/10/21 2018 02/11/21 0558 02/11/21 1249  BP: (!) 92/57 97/63 113/73 103/66  Pulse:  80 (!) 105 (!) 104  Resp:  20 20 18   Temp:  98.1 F (36.7 C) 98.2 F (36.8 C) 97.9 F (36.6 C)  TempSrc:  Oral Oral Oral  SpO2:  (!) 88% 95% 99%  Weight:      Height:        Intake/Output Summary (Last 24 hours) at 02/11/2021 1518 Last data filed at 02/11/2021 1300 Gross per 24 hour  Intake 1497.98 ml  Output 950 ml  Net 547.98 ml    Filed Weights   02/08/21 1012  Weight: 98 kg    Examination:  General exam: Appears calm and comfortable. Respiratory system: Clear to auscultation. Respiratory effort normal. Cardiovascular system: S1 & S2 heard, RRR. No JVD, murmurs, rubs, gallops or clicks. No pedal edema. Gastrointestinal system: Abdomen is nondistended, soft and  nontender. No organomegaly or masses felt. Normal bowel sounds heard. Central nervous system: Alert and oriented. No focal neurological deficits. Extremities: Symmetric 5 x 5 power. Skin: No rashes, lesions or ulcers. Psychiatry: Judgement and insight appear normal. Mood & affect appropriate.  Data Reviewed: I have personally reviewed following labs and imaging studies  CBC: Recent Labs  Lab 02/08/21 1106 02/09/21 0535 02/10/21 0434  02/11/21 0438  WBC 38.2* 22.9* 13.7* 21.9*  NEUTROABS 34.8* 20.9* 12.7* 19.8*  HGB 12.5* 10.9* 11.3* 11.0*  HCT 39.8 35.4* 35.9* 34.1*  MCV 89.0 90.1 87.1 86.8  PLT 356 235 203 182    Basic Metabolic Panel: Recent Labs  Lab 02/08/21 1106 02/09/21 0535 02/10/21 0434 02/11/21 0438  NA 136 137 131* 130*  K 3.8 3.6 2.6* 3.0*  CL 97* 98 90* 92*  CO2 31 34* 33* 34*  GLUCOSE 124* 93 93 91  BUN 28* 16 17 22   CREATININE 1.26* 0.96 0.88 0.86  CALCIUM 13.4* 11.6* 10.1 9.3  PHOS  --  2.4* 2.3*  --     GFR: Estimated Creatinine Clearance: 87.3 mL/min (by C-G formula based on SCr of 0.86 mg/dL). Liver Function Tests: Recent Labs  Lab 02/08/21 1106 02/09/21 0535 02/10/21 0434  AST 32  --   --   ALT 25  --   --   ALKPHOS 136*  --   --   BILITOT 0.7  --   --   PROT 8.5*  --   --   ALBUMIN 3.6 2.8* 2.6*    No results for input(s): LIPASE, AMYLASE in the last 168 hours. No results for input(s): AMMONIA in the last 168 hours. Coagulation Profile: No results for input(s): INR, PROTIME in the last 168 hours. Cardiac Enzymes: No results for input(s): CKTOTAL, CKMB, CKMBINDEX, TROPONINI in the last 168 hours. BNP (last 3 results) No results for input(s): PROBNP in the last 8760 hours. HbA1C: No results for input(s): HGBA1C in the last 72 hours. CBG: No results for input(s): GLUCAP in the last 168 hours. Lipid Profile: No results for input(s): CHOL, HDL, LDLCALC, TRIG, CHOLHDL, LDLDIRECT in the last 72 hours. Thyroid Function Tests: No results for input(s): TSH, T4TOTAL, FREET4, T3FREE, THYROIDAB in the last 72 hours. Anemia Panel: No results for input(s): VITAMINB12, FOLATE, FERRITIN, TIBC, IRON, RETICCTPCT in the last 72 hours. Sepsis Labs: Recent Labs  Lab 02/08/21 1500 02/10/21 0434  PROCALCITON  --  0.29  LATICACIDVEN 2.1*  --      Recent Results (from the past 240 hour(s))  Blood culture (routine x 2)     Status: None (Preliminary result)   Collection Time:  02/08/21  6:48 PM   Specimen: BLOOD  Result Value Ref Range Status   Specimen Description   Final    BLOOD RIGHT ANTECUBITAL Performed at [facility], [address] [place], [place] [address]    Special Requests   Final    BOTTLES DRAWN AEROBIC ONLY Blood Culture adequate volume Performed at [facility], [address], [place], [place] [address]    Culture   Final    NO GROWTH 3 DAYS Performed at [facility], [address], [place], [place] [address]    Report Status PENDING  Incomplete  Blood culture (routine x 2)     Status: None (Preliminary result)   Collection Time: 02/08/21  6:48 PM   Specimen: BLOOD  Result Value Ref Range Status   Specimen Description   Final  BLOOD BLOOD RIGHT ARM Performed at [facility], [address], [place], [place] [address]    Special Requests   Final    BOTTLES DRAWN AEROBIC ONLY Blood Culture adequate volume Performed at [facility], [address], [place], [place] [address]    Culture   Final    NO GROWTH 3 DAYS Performed at [facility], [address], [place], [place] [address]    Report Status PENDING  Incomplete         Radiology Studies: CT SOFT TISSUE NECK W CONTRAST  Result Date: 02/09/2021 CLINICAL DATA:  Metastatic disease evaluation, squamous cell carcinoma of the mouth with surgery and radiation therapy calming infra progressively worsening back pain EXAM: CT NECK WITH CONTRAST TECHNIQUE: Multidetector CT imaging of the neck was performed using the standard protocol following the bolus administration of intravenous contrast. CONTRAST:  OMNIPAQUE IOHEXOL 350 MG/ML SOLN COMPARISON:  08/15/2020 CT FINDINGS: Pharynx and larynx: Status post resection and treatment of a previously noted enhancing mass arising from the buccal surface of the right lower lip, with focal fat, likely related to reconstruction. No definite residual  enhancing mass is seen. Salivary glands: No inflammation, mass, or stone. Thyroid: Unchanged mild enlargement of the thyroid. A previously noted central hypodense lesion is not appreciated on the current exam. Lymph nodes: Status post bilateral neck dissection. No cervical lymphadenopathy. For mediastinal lymph nodes, please see same-day CT chest Vascular: Negative. Limited intracranial: Negative. Visualized orbits: Negative. Mastoids and visualized paranasal sinuses: Mucosal thickening in the right maxillary sinus. Otherwise negative. Skeleton: No acute osseous abnormality in the cervical spine. Status post partial resection of the mandible Upper chest: Please see same-day CT chest Other: Skin thickening overlying the neck, likely sequela of prior radiation. IMPRESSION: 1. Status post resection of and radiation therapy to an enhancing mass arising from the buccal surface of the right lower lip, with no residual enhancing mass seen. Reconstructive changes. No evidence of local recurrence. 2. Status post bilateral neck dissection. No cervical lymphadenopathy. 3. For findings in the chest, including mediastinal lymph nodes, please see same-day CT chest. Electronically Signed   By: Wiliam Ke M.D.   On: 02/09/2021 20:06   CT CHEST W CONTRAST  Result Date: 02/09/2021 CLINICAL DATA:  Cancer of unknown primary, staging; Metastatic disease evaluation EXAM: CT CHEST, ABDOMEN, AND PELVIS WITH CONTRAST TECHNIQUE: Multidetector CT imaging of the chest, abdomen and pelvis was performed following the standard protocol during bolus administration of intravenous contrast. CONTRAST:  OMNIPAQUE IOHEXOL 350 MG/ML SOLN COMPARISON:  February 08, 2021 FINDINGS: CT CHEST FINDINGS Cardiovascular: Heart is enlarged. Three-vessel coronary artery atherosclerotic calcifications. No pericardial effusion. Atherosclerotic calcifications of the aorta. Mediastinum/Nodes: There is irregular low-density lymph nodes within the AP window  with representative conglomeration of lymph nodes measuring 21 mm (series 3, image 23). Irregular low-density LEFT inferior hilar lymph node versus soft tissue mass measure 16 mm in the short axis (series 3, image 30). Prominent RIGHT paratracheal lymph node measures 10 mm in the short axis (series 3, image 15). Coarsely calcified RIGHT mediastinal lymph nodes.There is a low-density RIGHT hilar lymph node which measures 12 mm in the short axis (series 3, image 30). No axillary adenopathy. Lungs/Pleura: Along the LEFT lower lobe hilum, there is low-density soft tissue which demonstrates mild mass effect on the adjacent bronchus. It tracks along the LEFT main pulmonary artery and spans approximately 32 by 17 mm in representative cross-sectional area (series 6, image 299).  There is irregularity of the LEFT bronchus at the bifurcation in the region of this mass, concerning for bronchial wall invasion. No pleural effusion or pneumothorax. Bibasilar enhancing consolidative opacities most consistent with atelectasis. In the peripheral RIGHT upper lobe, there is a wedge-shaped opacity with scattered internal lucency which measures 22 mm (series 5, image 32). No large central pulmonary embolism. There is a rounded pulmonary nodule of the LEFT upper lobe which measures 8 x 6 mm (series 5, image 61). Musculoskeletal: There is an oval nodule in the upper inner RIGHT breast which measures 9 mm (series 3, image 30). There is osseous destruction of the sternum with adjacent soft tissue mass most consistent with osseous metastatic disease. There is osseous destruction of the RIGHT posterolateral sixth rib with a pathologic fracture and associated soft tissue mass. Osseous destruction of the posterolateral LEFT eighth and eleventh there is a lytic lesion of T12 which extends into the RIGHT posterior element rib. Osseous destruction with associated soft tissue mass at the LEFT scapula. CT ABDOMEN PELVIS FINDINGS Hepatobiliary: There  are innumerable hypodense masses throughout the liver. Many of these masses demonstrate fluid attenuation and likely reflect hepatic cysts. However several are indeterminate and may reflect metastatic disease. Representative indeterminate mass in the RIGHT liver measures 22 mm (series 3, image 71). Mass in the LEFT liver is irregular in appearance and is indeterminate; it spans 38 mm (series 3, image 50). Portal vein is patent. Gallbladder is distended with likely cholelithiasis. Pancreas: Unremarkable. No pancreatic ductal dilatation or surrounding inflammatory changes. Spleen: There are multiple hypodense masses in the spleen, nonspecific. Multiple punctate calcifications throughout the spleen consistent with sequela of prior granulomatous infection. Adrenals/Urinary Tract: There is an indeterminate 24 mm RIGHT adrenal nodule. The kidneys enhance symmetrically. No hydronephrosis. No obstructive nephrolithiasis. Bladder is circumferentially thick-walled, most consistent with sequela of chronic outlet obstruction. Stomach/Bowel: No evidence of bowel obstruction. Appendix is normal. Mild wall thickening of the terminal ileum. Vascular/Lymphatic: Atherosclerotic calcifications. No suspicious lymphadenopathy visualized within the pelvis or retroperitoneum. Reproductive: Prostate is present. Other: Trace free fluid in the pelvis. Musculoskeletal: Lytic lesions involving L1, L3, the LEFT iliac crest, LEFT iliac adjacent to the SI joint, LEFT sacrum, and RIGHT iliac bone. IMPRESSION: 1. There is an irregular masslike opacity along the LEFT lower lobe hilar border with mild mass effect and apparent invasion of the bronchus at this site. Findings are concerning for primary lung malignancy. 2. Irregular low-density mediastinal and bilateral hilar adenopathy are concerning for metastatic nodal disease. 3. Innumerable hypodense masses throughout the liver, many of which are cysts but several which are indeterminate and may  reflect hepatic metastatic disease. These could be further characterized with dedicated MRI liver with contrast when and if clinically appropriate. 4. Indeterminate RIGHT adrenal nodule, suspicious for metastatic disease. This could be further assessed with MRI if clinically indicated. 5. There is osseous metastatic disease involving multiple vertebral bodies in the thoracic and lumbar spine, bilateral ribs, the LEFT scapula, the sternum and the LEFT greater than RIGHT pelvis. 6. Wedge shape opacity of the RIGHT upper lobe with suggestion of internal lucency. Findings could be infectious or inflammatory in etiology. Given wedge-shaped appearance, differential considerations include pulmonary infarction. No central pulmonary embolism within the limitations of this exam. 7. Additional pulmonary nodule within the LEFT upper lobe is indeterminate and may reflect additional site of disease. 8. Nonspecific oval mass in the RIGHT breast. This could be further assessed with dedicated diagnostic mammogram and ultrasound if deemed clinically necessary.  9. Mild wall thickening of the terminal ileum, nonspecific. 10. Scattered hypodense masses throughout the spleen, nonspecific. Aortic Atherosclerosis (ICD10-I70.0). Electronically Signed   By: Meda Klinefelter M.D.   On: 02/09/2021 20:09   CT ABDOMEN PELVIS W CONTRAST  Result Date: 02/09/2021 CLINICAL DATA:  Cancer of unknown primary, staging; Metastatic disease evaluation EXAM: CT CHEST, ABDOMEN, AND PELVIS WITH CONTRAST TECHNIQUE: Multidetector CT imaging of the chest, abdomen and pelvis was performed following the standard protocol during bolus administration of intravenous contrast. CONTRAST:  OMNIPAQUE IOHEXOL 350 MG/ML SOLN COMPARISON:  February 08, 2021 FINDINGS: CT CHEST FINDINGS Cardiovascular: Heart is enlarged. Three-vessel coronary artery atherosclerotic calcifications. No pericardial effusion. Atherosclerotic calcifications of the aorta.  Mediastinum/Nodes: There is irregular low-density lymph nodes within the AP window with representative conglomeration of lymph nodes measuring 21 mm (series 3, image 23). Irregular low-density LEFT inferior hilar lymph node versus soft tissue mass measure 16 mm in the short axis (series 3, image 30). Prominent RIGHT paratracheal lymph node measures 10 mm in the short axis (series 3, image 15). Coarsely calcified RIGHT mediastinal lymph nodes.There is a low-density RIGHT hilar lymph node which measures 12 mm in the short axis (series 3, image 30). No axillary adenopathy. Lungs/Pleura: Along the LEFT lower lobe hilum, there is low-density soft tissue which demonstrates mild mass effect on the adjacent bronchus. It tracks along the LEFT main pulmonary artery and spans approximately 32 by 17 mm in representative cross-sectional area (series 6, image 299). There is irregularity of the LEFT bronchus at the bifurcation in the region of this mass, concerning for bronchial wall invasion. No pleural effusion or pneumothorax. Bibasilar enhancing consolidative opacities most consistent with atelectasis. In the peripheral RIGHT upper lobe, there is a wedge-shaped opacity with scattered internal lucency which measures 22 mm (series 5, image 32). No large central pulmonary embolism. There is a rounded pulmonary nodule of the LEFT upper lobe which measures 8 x 6 mm (series 5, image 61). Musculoskeletal: There is an oval nodule in the upper inner RIGHT breast which measures 9 mm (series 3, image 30). There is osseous destruction of the sternum with adjacent soft tissue mass most consistent with osseous metastatic disease. There is osseous destruction of the RIGHT posterolateral sixth rib with a pathologic fracture and associated soft tissue mass. Osseous destruction of the posterolateral LEFT eighth and eleventh there is a lytic lesion of T12 which extends into the RIGHT posterior element rib. Osseous destruction with associated  soft tissue mass at the LEFT scapula. CT ABDOMEN PELVIS FINDINGS Hepatobiliary: There are innumerable hypodense masses throughout the liver. Many of these masses demonstrate fluid attenuation and likely reflect hepatic cysts. However several are indeterminate and may reflect metastatic disease. Representative indeterminate mass in the RIGHT liver measures 22 mm (series 3, image 71). Mass in the LEFT liver is irregular in appearance and is indeterminate; it spans 38 mm (series 3, image 50). Portal vein is patent. Gallbladder is distended with likely cholelithiasis. Pancreas: Unremarkable. No pancreatic ductal dilatation or surrounding inflammatory changes. Spleen: There are multiple hypodense masses in the spleen, nonspecific. Multiple punctate calcifications throughout the spleen consistent with sequela of prior granulomatous infection. Adrenals/Urinary Tract: There is an indeterminate 24 mm RIGHT adrenal nodule. The kidneys enhance symmetrically. No hydronephrosis. No obstructive nephrolithiasis. Bladder is circumferentially thick-walled, most consistent with sequela of chronic outlet obstruction. Stomach/Bowel: No evidence of bowel obstruction. Appendix is normal. Mild wall thickening of the terminal ileum. Vascular/Lymphatic: Atherosclerotic calcifications. No suspicious lymphadenopathy visualized within the pelvis  or retroperitoneum. Reproductive: Prostate is present. Other: Trace free fluid in the pelvis. Musculoskeletal: Lytic lesions involving L1, L3, the LEFT iliac crest, LEFT iliac adjacent to the SI joint, LEFT sacrum, and RIGHT iliac bone. IMPRESSION: 1. There is an irregular masslike opacity along the LEFT lower lobe hilar border with mild mass effect and apparent invasion of the bronchus at this site. Findings are concerning for primary lung malignancy. 2. Irregular low-density mediastinal and bilateral hilar adenopathy are concerning for metastatic nodal disease. 3. Innumerable hypodense masses  throughout the liver, many of which are cysts but several which are indeterminate and may reflect hepatic metastatic disease. These could be further characterized with dedicated MRI liver with contrast when and if clinically appropriate. 4. Indeterminate RIGHT adrenal nodule, suspicious for metastatic disease. This could be further assessed with MRI if clinically indicated. 5. There is osseous metastatic disease involving multiple vertebral bodies in the thoracic and lumbar spine, bilateral ribs, the LEFT scapula, the sternum and the LEFT greater than RIGHT pelvis. 6. Wedge shape opacity of the RIGHT upper lobe with suggestion of internal lucency. Findings could be infectious or inflammatory in etiology. Given wedge-shaped appearance, differential considerations include pulmonary infarction. No central pulmonary embolism within the limitations of this exam. 7. Additional pulmonary nodule within the LEFT upper lobe is indeterminate and may reflect additional site of disease. 8. Nonspecific oval mass in the RIGHT breast. This could be further assessed with dedicated diagnostic mammogram and ultrasound if deemed clinically necessary. 9. Mild wall thickening of the terminal ileum, nonspecific. 10. Scattered hypodense masses throughout the spleen, nonspecific. Aortic Atherosclerosis (ICD10-I70.0). Electronically Signed   By: Meda Klinefelter M.D.   On: 02/09/2021 20:09     Scheduled Meds:  furosemide  20 mg Intravenous Daily   influenza vaccine adjuvanted  0.5 mL Intramuscular Tomorrow-1000   metoprolol tartrate  100 mg Oral BID   potassium chloride  40 mEq Oral Daily   rivaroxaban  20 mg Oral Q supper   Continuous Infusions:  lactated ringers Stopped (02/10/21 1000)     LOS: 2 days   Time spent:  Azucena Fallen, DO [facility]  If 7PM-7AM, please contact night-coverage [email or web address]  02/11/2021, 3:18 PM

## 2021-02-11 NOTE — Plan of Care (Signed)

## 2021-02-11 NOTE — Progress Notes (Signed)
   02/11/21 2100  Assess: MEWS Score  Temp 98.8 F (37.1 C)  BP 118/62  Pulse Rate 88  ECG Heart Rate (!) 118  Level of Consciousness Alert  Assess: MEWS Score  MEWS Temp 0  MEWS Systolic 0  MEWS Pulse 2  MEWS RR 0  MEWS LOC 0  MEWS Score 2  MEWS Score Color Yellow  Assess: if the MEWS score is Yellow or Red  Were vital signs taken at a resting state? Yes  Focused Assessment No change from prior assessment  Does the patient meet 2 or more of the SIRS criteria? No  MEWS guidelines implemented *See Row Information* Yes  Treat  MEWS Interventions Administered scheduled meds/treatments  Take Vital Signs  Increase Vital Sign Frequency  Yellow: Q 2hr X 2 then Q 4hr X 2, if remains yellow, continue Q 4hrs  Escalate  MEWS: Escalate Yellow: discuss with charge nurse/RN and consider discussing with provider and RRT  Notify: Charge Nurse/RN  Name of Charge Nurse/RN Notified Lorriane Shire RN  Date Charge Nurse/RN Notified 02/11/21  Time Charge Nurse/RN Notified 2114  Assess: SIRS CRITERIA  SIRS Temperature  0  SIRS Pulse 1  SIRS Respirations  0  SIRS WBC 0  SIRS Score Sum  1

## 2021-02-12 ENCOUNTER — Ambulatory Visit: Payer: Medicare Other

## 2021-02-12 ENCOUNTER — Inpatient Hospital Stay (HOSPITAL_COMMUNITY): Payer: Medicare Other

## 2021-02-12 ENCOUNTER — Other Ambulatory Visit: Payer: Self-pay

## 2021-02-12 LAB — CBC WITH DIFFERENTIAL/PLATELET
Abs Immature Granulocytes: 0.16 10*3/uL — ABNORMAL HIGH (ref 0.00–0.07)
Basophils Absolute: 0 10*3/uL (ref 0.0–0.1)
Basophils Relative: 0 %
Eosinophils Absolute: 0.3 10*3/uL (ref 0.0–0.5)
Eosinophils Relative: 1 %
HCT: 33.7 % — ABNORMAL LOW (ref 39.0–52.0)
Hemoglobin: 10.8 g/dL — ABNORMAL LOW (ref 13.0–17.0)
Immature Granulocytes: 1 %
Lymphocytes Relative: 2 %
Lymphs Abs: 0.4 10*3/uL — ABNORMAL LOW (ref 0.7–4.0)
MCH: 27.7 pg (ref 26.0–34.0)
MCHC: 32 g/dL (ref 30.0–36.0)
MCV: 86.4 fL (ref 80.0–100.0)
Monocytes Absolute: 1 10*3/uL (ref 0.1–1.0)
Monocytes Relative: 5 %
Neutro Abs: 17.2 10*3/uL — ABNORMAL HIGH (ref 1.7–7.7)
Neutrophils Relative %: 91 %
Platelets: 191 10*3/uL (ref 150–400)
RBC: 3.9 MIL/uL — ABNORMAL LOW (ref 4.22–5.81)
RDW: 13.4 % (ref 11.5–15.5)
WBC: 19 10*3/uL — ABNORMAL HIGH (ref 4.0–10.5)
nRBC: 0 % (ref 0.0–0.2)

## 2021-02-12 LAB — PROTIME-INR
INR: 1.1 (ref 0.8–1.2)
Prothrombin Time: 14.7 seconds (ref 11.4–15.2)

## 2021-02-12 LAB — BASIC METABOLIC PANEL
Anion gap: 6 (ref 5–15)
BUN: 18 mg/dL (ref 8–23)
CO2: 29 mmol/L (ref 22–32)
Calcium: 8.9 mg/dL (ref 8.9–10.3)
Chloride: 95 mmol/L — ABNORMAL LOW (ref 98–111)
Creatinine, Ser: 0.82 mg/dL (ref 0.61–1.24)
GFR, Estimated: >60 mL/min (ref >60)
Glucose, Bld: 103 mg/dL — ABNORMAL HIGH (ref 70–99)
Potassium: 3.2 mmol/L — ABNORMAL LOW (ref 3.5–5.1)
Sodium: 130 mmol/L — ABNORMAL LOW (ref 135–145)

## 2021-02-12 IMAGING — CT CT BIOPSY
1 of 5 series · 10 of 32 positions shown, 16 images · non-contrast
Comparison: CT the chest, abdomen and pelvis-[DATE]

INDICATION: History of head neck cancer, now with concern for metastatic
disease. Please perform CT-guided biopsy expansile lesion involving
the lateral aspect of the right sixth rib for tissue diagnostic
purposes.

EXAM:
CT-GUIDED BIOPSY OF EXPANSILE LESION INVOLVING LATERAL ASPECT OF THE
RIGHT SIXTH RIB

[Series 3: i-spiral 5.0 bf37 · axial · 0.98mm/px · z∈[+783,+906]mm · 10 of 43 slices shown, 16 images]
[im 4/43  soft-tissue]
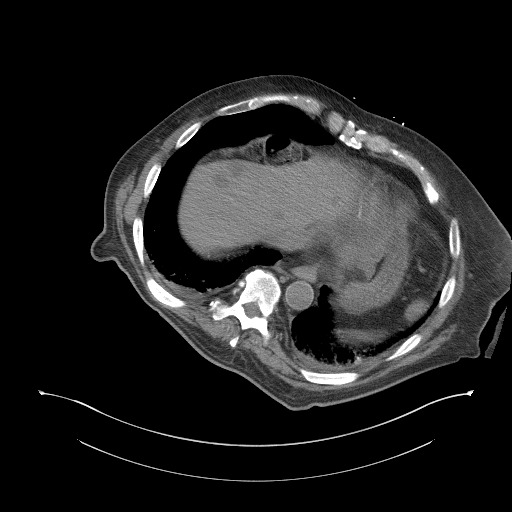
[im 4/43  bone]
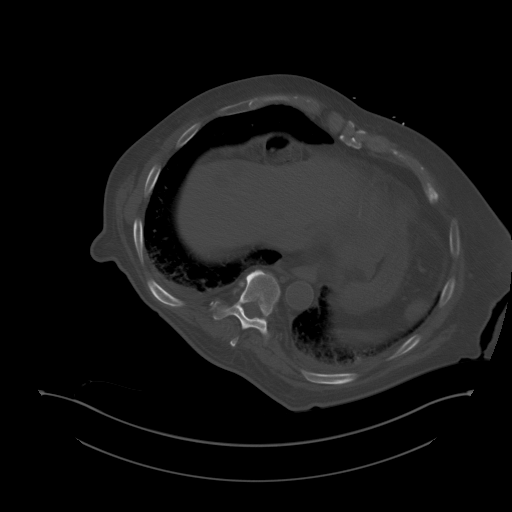
[im 8/43  soft-tissue]
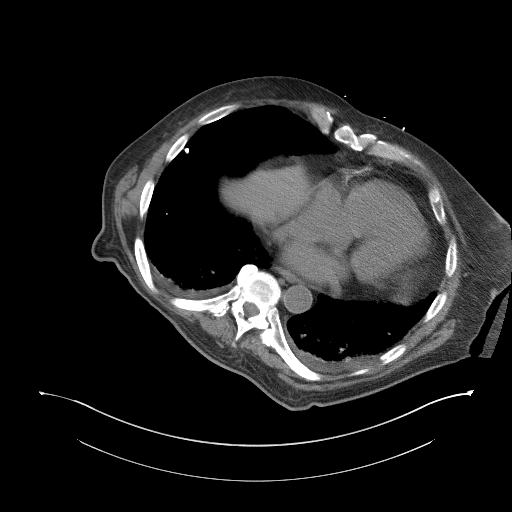
[im 12/43  soft-tissue]
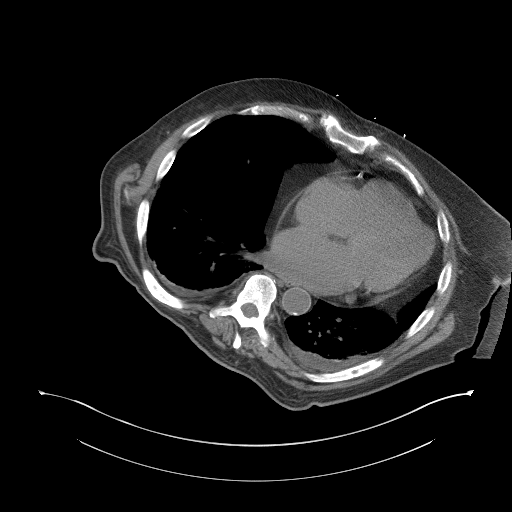
[im 16/43  soft-tissue]
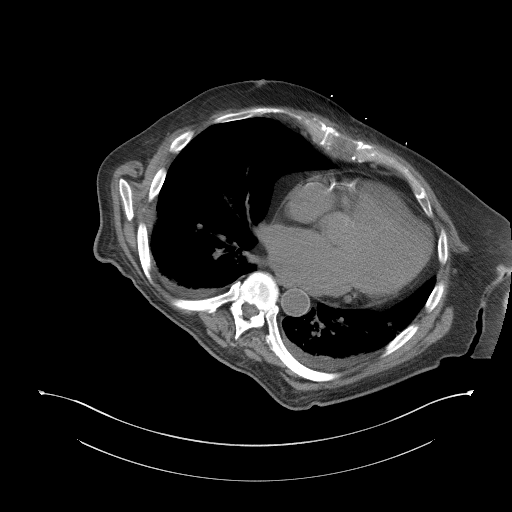
[im 20/43  soft-tissue]
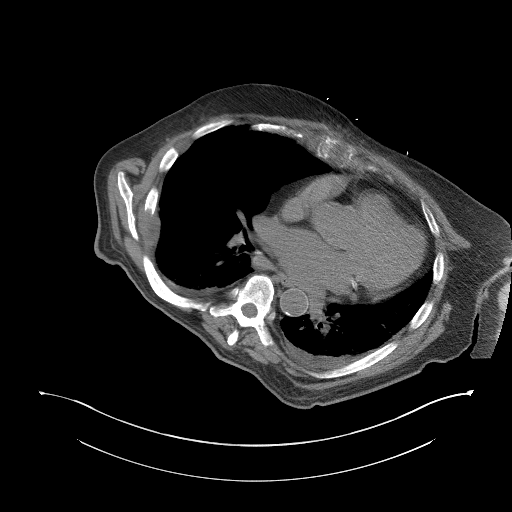
[im 23/43  soft-tissue]
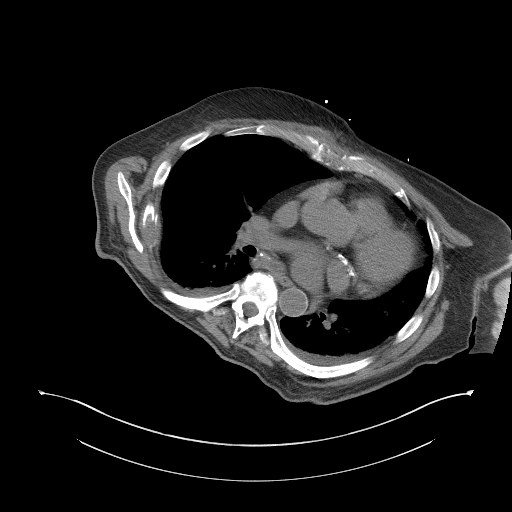
[im 27/43  soft-tissue]
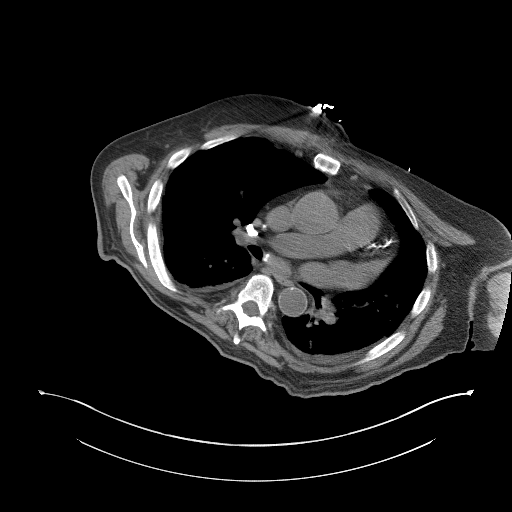
[im 27/43  lung]
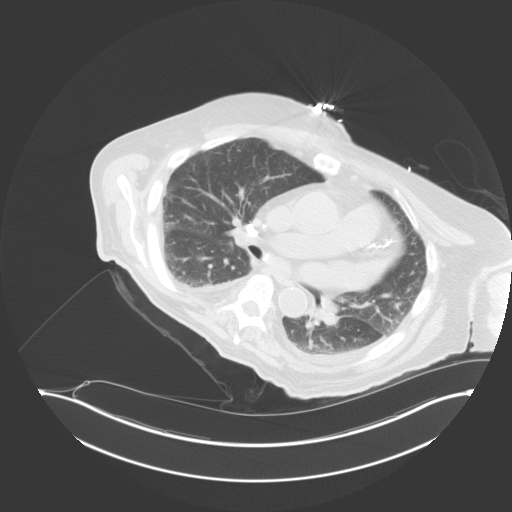
[im 31/43  soft-tissue]
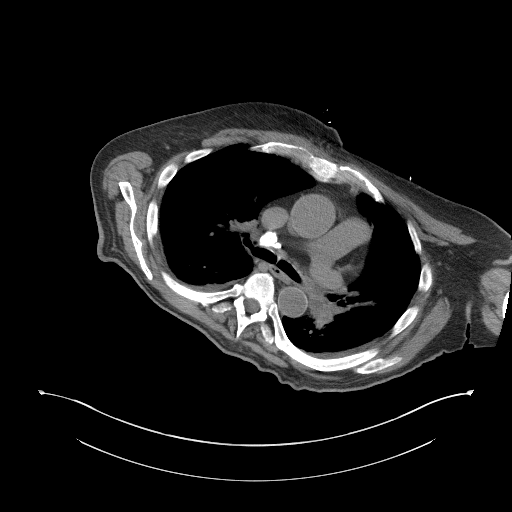
[im 31/43  lung]
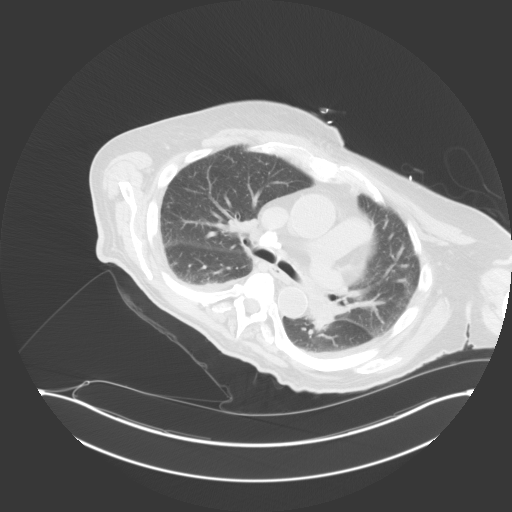
[im 35/43  soft-tissue]
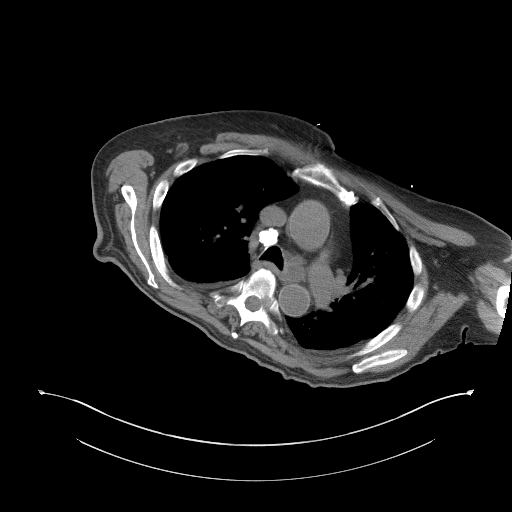
[im 35/43  lung]
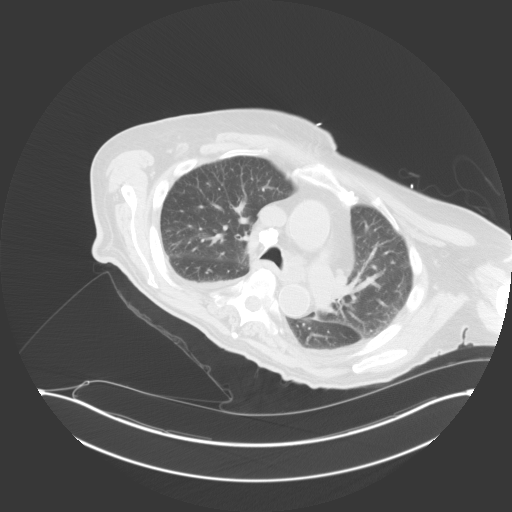
[im 35/43  bone]
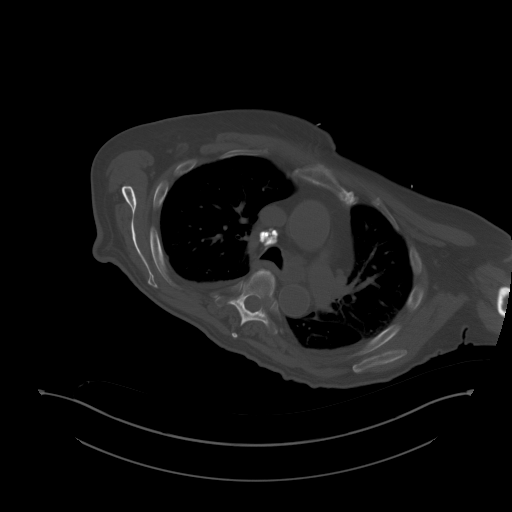
[im 39/43  soft-tissue]
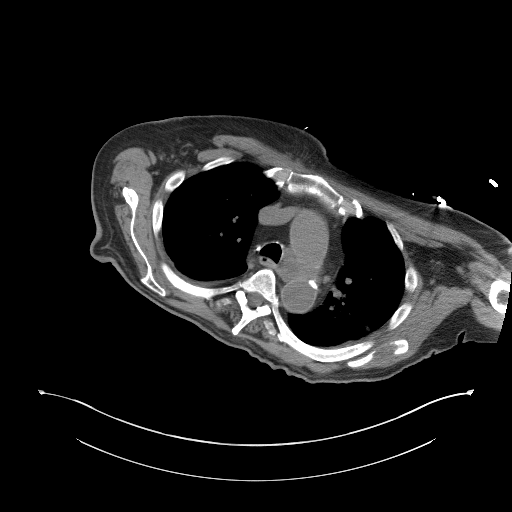
[im 39/43  lung]
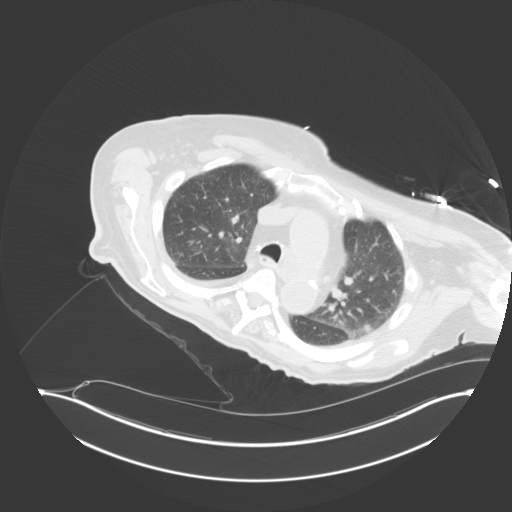

[10 of 32 positions shown; findings below may reference images not displayed]

MEDICATIONS:
None.

ANESTHESIA/SEDATION:
Moderate (conscious) sedation was employed during this procedure as
administered by the [REDACTED].

A total of Versed 0.5 mg and Fentanyl 25 mcg was administered
intravenously.

Moderate Sedation Time: 12 minutes. The patient's level of
consciousness and vital signs were monitored continuously by
radiology nursing throughout the procedure under my direct
supervision.

CONTRAST:  None.

COMPLICATIONS:
None immediate.

PROCEDURE:
Informed consent was obtained from the patient following an
explanation of the procedure, risks, benefits and alternatives. A
time out was performed prior to the initiation of the procedure.

The patient was positioned supine, slightly LPO on the CT table and
a limited CT was performed for procedural planning demonstrating
unchanged size and appearance the approximately 3.6 x 2.1 cm
expansile lesion involving the lateral aspect of the right sixth rib
(image 24, series 3). The procedure was planned. The operative site
was prepped and draped in the usual sterile fashion. Appropriate
trajectory was confirmed with a 22 gauge spinal needle after the
adjacent tissues were anesthetized with 1% Lidocaine with
epinephrine.

Under intermittent CT guidance, a 17 gauge coaxial needle was
advanced into the peripheral aspect of the mass. Appropriate
positioning was confirmed and 6 core needle biopsy samples were
obtained with an 18 gauge core needle biopsy device. The co-axial
needle was removed and hemostasis was achieved with manual
compression.

A limited postprocedural CT was negative for pneumothorax,
hemorrhage or additional complication. A dressing was applied. The
patient tolerated the procedure well without immediate
postprocedural complication.
IMPRESSION: Technically successful CT guided core needle biopsy of expansile
lesion involving the lateral aspect of the right sixth rib.

## 2021-02-12 MED ORDER — MIDAZOLAM HCL 2 MG/2ML IJ SOLN
INTRAMUSCULAR | Status: AC
  Filled 2021-02-12: qty 4

## 2021-02-12 MED ORDER — FENTANYL CITRATE (PF) 100 MCG/2ML IJ SOLN
INTRAMUSCULAR | Status: AC
  Filled 2021-02-12: qty 2

## 2021-02-12 MED ORDER — FUROSEMIDE 20 MG PO TABS: 20.0000 mg | ORAL_TABLET | Freq: Every day | ORAL | 0 refills | Status: DC

## 2021-02-12 MED ORDER — POTASSIUM CHLORIDE CRYS ER 20 MEQ PO TBCR: 40.0000 meq | EXTENDED_RELEASE_TABLET | Freq: Every day | ORAL | 0 refills | Status: DC

## 2021-02-12 MED ORDER — SODIUM CHLORIDE 0.9 % IV SOLN
INTRAVENOUS | Status: AC
  Filled 2021-02-12: qty 250

## 2021-02-12 MED ORDER — LIDOCAINE HCL (PF) 1 % IJ SOLN
INTRAMUSCULAR | Status: AC | PRN
  Administered 2021-02-12: 10 mL

## 2021-02-12 MED ORDER — FENTANYL CITRATE (PF) 100 MCG/2ML IJ SOLN
INTRAMUSCULAR | Status: AC | PRN
  Administered 2021-02-12: 25 ug via INTRAVENOUS

## 2021-02-12 MED ORDER — MIDAZOLAM HCL 2 MG/2ML IJ SOLN
INTRAMUSCULAR | Status: AC | PRN
  Administered 2021-02-12: .5 mg via INTRAVENOUS

## 2021-02-12 MED ORDER — RIVAROXABAN 20 MG PO TABS: 20.0000 mg | ORAL_TABLET | Freq: Every day | ORAL | 3 refills | Status: DC

## 2021-02-12 NOTE — TOC Transition Note (Addendum)
Transition of Care [facility]) - CM/SW Discharge Note   Patient Details  Name: Robert [name] MRN: [id number] Date of Birth: 27-Mar-1941  [facility] CM/SW Contact:  Leeroy Cha, RN Phone Number: 02/12/2021, 2:50 PM   Clinical Narrative:    Dcd to return home with family.  No toc needs present. Rolling walker ordered through adapt at 1512  Final next level of care: Home/Self Care Barriers to Discharge: No Barriers Identified   Patient Goals and CMS Choice Patient states their goals for this hospitalization and ongoing recovery are:: to go home CMS Medicare.gov Compare Post Acute Care list provided to:: Patient    Discharge Placement                       Discharge Plan and Services   Discharge Planning Services: CM Consult                                 Social Determinants of Health (SDOH) Interventions     Readmission Risk Interventions No flowsheet data found.

## 2021-02-12 NOTE — Procedures (Signed)
Pre procedural Dx: Expansile lesion involving the lateral aspect of the right sixth rib. Post procedural Dx: Same  Technically successful CT guided biopsy of expansile lesion involving the lateral aspect of the right sixth rib.   EBL: None.   Complications: None immediate.   Ronny Bacon, MD Pager #: [phone]

## 2021-02-12 NOTE — Progress Notes (Incomplete)
Radiation Oncology         [phone]) [phone] ________________________________  Initial Outpatient Consultation  Name: Robert [name] MRN: [id number]  Date: 02/13/2021  DOB: [birth year]  CC:Farris Has, MD  Azucena Fallen, MD   REFERRING PHYSICIAN: Azucena Fallen, MD  DIAGNOSIS: No diagnosis found.  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 male who presented to the ED on 02/08/21 with progressively worse back pain for the 2 weeks. MRI performed in the ED concerning for metastatic disease to lumbar spine, showing overall abnormal marrow signal with areas of more discrete abnormality. Patient was admitted for pain control and oncology evaluation by Dr. Arbutus Ped.  Marland Kitchen  PREVIOUS RADIATION THERAPY: {EXAM; YES/NO:19492::"No"}  PAST MEDICAL HISTORY:  has a past medical history of A-fib (HCC) and HTN (hypertension).    PAST SURGICAL HISTORY: Past Surgical History:  Procedure Laterality Date   DENTAL SURGERY      FAMILY HISTORY: family history includes COPD in his mother; Hypertension in his father.  SOCIAL HISTORY:  reports that he has never smoked. He has quit using smokeless tobacco.  His smokeless tobacco use included chew. He reports current alcohol use. He reports that he does not use drugs.  ALLERGIES: Patient has no known allergies.  MEDICATIONS:  Current Outpatient Medications  Medication Sig Dispense Refill   docusate sodium (COLACE) 100 MG capsule Take 100 mg by mouth 2 (two) times daily as needed for mild constipation.      furosemide (LASIX) 20 MG tablet Take 1 tablet (20 mg total) by mouth daily. 30 tablet 0   metoprolol tartrate (LOPRESSOR) 100 MG tablet Take 100 mg by mouth 2 (two) times daily.     potassium chloride SA (KLOR-CON) 20 MEQ tablet Take 2 tablets (40 mEq total) by mouth daily. 60 tablet 0   [START ON 02/14/2021] rivaroxaban (XARELTO) 20 MG TABS tablet Take 1 tablet (20 mg total) by mouth daily with supper. 90 tablet 3   No current facility-administered medications for this encounter.   Facility-Administered Medications Ordered in Other Encounters  Medication Dose Route Frequency Provider Last Rate Last Admin   acetaminophen (TYLENOL) tablet 650 mg  650 mg Oral Q6H PRN Bobette Mo, MD   650 mg at 02/11/21 9528   Or   acetaminophen (TYLENOL) suppository 650 mg  650 mg Rectal Q6H PRN Bobette Mo, MD       cyclobenzaprine (FLEXERIL) tablet 5 mg  5 mg Oral TID PRN Bobette Mo, MD       docusate sodium (COLACE) capsule 100 mg  100 mg Oral BID PRN Bobette Mo, MD   100 mg at 02/09/21 2200   fentaNYL (SUBLIMAZE) 100 MCG/2ML injection            furosemide (LASIX) injection 20 mg  20 mg Intravenous Daily Bobette Mo, MD   20 mg at 02/12/21 0912   ketorolac (TORADOL) 15 MG/ML injection 15 mg  15 mg Intravenous Q6H PRN Bobette Mo, MD   15 mg at 02/12/21 4132   lactated ringers infusion   Intravenous Continuous Bobette Mo,  MD   Stopped at 02/10/21 1000   metoprolol tartrate (LOPRESSOR) tablet 100 mg  100 mg Oral BID Bobette Mo, MD   100 mg at 02/12/21 0912   midazolam (VERSED) 2 MG/2ML injection            ondansetron (ZOFRAN) tablet 4 mg  4 mg Oral Q6H PRN Bobette Mo, MD       Or   ondansetron [facility]) injection 4 mg  4 mg Intravenous Q6H PRN Bobette Mo, MD       potassium chloride SA (KLOR-CON) CR tablet 40 mEq  40 mEq Oral Daily Azucena Fallen, MD   40 mEq at 02/12/21 5621   rivaroxaban (XARELTO) tablet 20 mg  20  mg Oral Q supper Bobette Mo, MD   20 mg at 02/12/21 1653   sodium chloride 0.9 % infusion             REVIEW OF SYSTEMS:  Notable for that above.   PHYSICAL EXAM:  vitals were not taken for this visit.   General: Alert and oriented, in no acute distress *** HEENT: Head is normocephalic. Extraocular movements are intact. Oropharynx is clear. Neck: Neck is supple, no palpable cervical or supraclavicular lymphadenopathy. Heart: Regular in rate and rhythm with no murmurs, rubs, or gallops. Chest: Clear to auscultation bilaterally, with no rhonchi, wheezes, or rales. Abdomen: Soft, nontender, nondistended, with no rigidity or guarding. Extremities: No cyanosis or edema. Lymphatics: see Neck Exam Skin: No concerning lesions. Musculoskeletal: symmetric strength and muscle tone throughout. Neurologic: Cranial nerves II through XII are grossly intact. No obvious focalities. Speech is fluent. Coordination is intact. Psychiatric: Judgment and insight are intact. Affect is appropriate.   ECOG = ***  0 - Asymptomatic (Fully active, able to carry on all predisease activities without restriction)  1 - Symptomatic but completely ambulatory (Restricted in physically strenuous activity but ambulatory and able to carry out work of a light or sedentary nature. For example, light housework, office work)  2 - Symptomatic, <50% in bed during the day (Ambulatory and capable of all self care but unable to carry out any work activities. Up and about more than 50% of waking hours)  3 - Symptomatic, >50% in bed, but not bedbound (Capable of only limited self-care, confined to bed or chair 50% or more of waking hours)  4 - Bedbound (Completely disabled. Cannot carry on any self-care. Totally confined to bed or chair)  5 - Death   Santiago Glad MM, Creech RH, Tormey DC, et al. [phone]). "Toxicity and response criteria of the [facility]". Am. Evlyn Clines. Oncol. 5 (6): 649-55   LABORATORY  DATA:  Lab Results  Component Value Date   WBC 19.0 (H) 02/12/2021   HGB 10.8 (L) 02/12/2021   HCT 33.7 (L) 02/12/2021   MCV 86.4 02/12/2021   PLT 191 02/12/2021   CMP     Component Value Date/Time   NA 130 (L) 02/12/2021 0515   K 3.2 (L) 02/12/2021 0515   CL 95 (L) 02/12/2021 0515   CO2 29 02/12/2021 0515   GLUCOSE 103 (H) 02/12/2021 0515   BUN 18 02/12/2021 0515   CREATININE 0.82 02/12/2021 0515   CALCIUM 8.9 02/12/2021 0515   PROT 8.5 (H) 02/08/2021 1106   ALBUMIN 2.6 (L) 02/10/2021 0434   AST 32 02/08/2021 1106   ALT 25 02/08/2021 1106   ALKPHOS 136 (H) 02/08/2021 1106   BILITOT 0.7 02/08/2021 1106   GFRNONAA >60 02/12/2021  4132         RADIOGRAPHY: DG Lumbar Spine Complete  Result Date: 02/08/2021 CLINICAL DATA:  Low back pain extending into the left flank for 3 days appeared EXAM: LUMBAR SPINE - COMPLETE 4+ VIEW COMPARISON:  None. FINDINGS: Alignment is within normal limits. Moderate disc height loss at L5-S1. Mild disc height loss at L1-L2 and L2-L3. advanced facet degenerative changes seen throughout, greatest at L4-L5. IMPRESSION: No acute abnormality of the lumbar spine. Multilevel degenerative changes as above. Electronically Signed   By: Acquanetta Belling M.D.   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 CONTRAST:  OMNIPAQUE IOHEXOL 350 MG/ML SOLN COMPARISON:  08/15/2020 CT FINDINGS: Pharynx and larynx: Status post resection and treatment of a previously noted enhancing mass arising from the buccal surface of the right lower lip, with focal fat, likely related to reconstruction. No definite  residual enhancing mass is seen. Salivary glands: No inflammation, mass, or stone. Thyroid: Unchanged mild enlargement of the thyroid. A previously noted central hypodense lesion is not appreciated on the current exam. Lymph nodes: Status post bilateral neck dissection. No cervical lymphadenopathy. For mediastinal lymph nodes, please see same-day CT chest Vascular: Negative. Limited intracranial: Negative. Visualized orbits: Negative. Mastoids and visualized paranasal sinuses: Mucosal thickening in the right maxillary sinus. Otherwise negative. Skeleton: No acute osseous abnormality in the cervical spine. Status post partial resection of the mandible Upper chest: Please see same-day CT chest Other: Skin thickening overlying the neck, likely sequela of prior radiation. IMPRESSION: 1. Status post resection of and radiation therapy to an enhancing mass arising from the buccal surface of the right lower lip, with no residual enhancing mass seen. Reconstructive changes. No evidence of local recurrence. 2. Status post bilateral neck dissection. No cervical lymphadenopathy. 3. For findings in the chest, including mediastinal lymph nodes, please see same-day CT chest. Electronically Signed   By: Wiliam Ke M.D.   On: 02/09/2021 20:06   CT CHEST W CONTRAST  Result Date: 02/09/2021 CLINICAL DATA:  Cancer of unknown primary, staging; Metastatic disease evaluation EXAM: CT CHEST, ABDOMEN, AND PELVIS WITH CONTRAST TECHNIQUE: Multidetector CT imaging of the chest, abdomen and pelvis was performed following the standard protocol during bolus administration of intravenous contrast. CONTRAST:  OMNIPAQUE IOHEXOL 350 MG/ML SOLN COMPARISON:  February 08, 2021 FINDINGS: CT CHEST FINDINGS Cardiovascular: Heart is enlarged. Three-vessel coronary artery atherosclerotic calcifications. No pericardial effusion. Atherosclerotic calcifications of the aorta. Mediastinum/Nodes: There is irregular low-density lymph nodes within the  AP window with representative conglomeration of lymph nodes measuring 21 mm (series 3, image 23). Irregular low-density LEFT inferior hilar lymph node versus soft tissue mass measure 16 mm in the short axis (series 3, image 30). Prominent RIGHT paratracheal lymph node measures 10 mm in the short axis (series 3, image 15). Coarsely calcified RIGHT mediastinal lymph nodes.There is a low-density RIGHT hilar lymph node which measures 12 mm in the short axis (series 3, image 30). No axillary adenopathy. Lungs/Pleura: Along the LEFT lower lobe hilum, there is low-density soft tissue which demonstrates mild mass effect on the adjacent bronchus. It tracks along the LEFT main pulmonary artery and spans approximately 32 by 17 mm in  representative cross-sectional area (series 6, image 299). There is irregularity of the LEFT bronchus at the bifurcation in the region of this mass, concerning for bronchial wall invasion. No pleural effusion or pneumothorax. Bibasilar enhancing consolidative opacities most consistent with atelectasis. In the peripheral RIGHT upper lobe, there is a wedge-shaped opacity with scattered internal lucency which measures 22 mm (series 5, image 32). No large central pulmonary embolism. There is a rounded pulmonary nodule of the LEFT upper lobe which measures 8 x 6 mm (series 5, image 61). Musculoskeletal: There is an oval nodule in the upper inner RIGHT breast which measures 9 mm (series 3, image 30). There is osseous destruction of the sternum with adjacent soft tissue mass most consistent with osseous metastatic disease. There is osseous destruction of the RIGHT posterolateral sixth rib with a pathologic fracture and associated soft tissue mass. Osseous destruction of the posterolateral LEFT eighth and eleventh there is a lytic lesion of T12 which extends into the RIGHT posterior element rib. Osseous destruction with associated soft tissue mass at the LEFT scapula. CT ABDOMEN PELVIS FINDINGS  Hepatobiliary: There are innumerable hypodense masses throughout the liver. Many of these masses demonstrate fluid attenuation and likely reflect hepatic cysts. However several are indeterminate and may reflect metastatic disease. Representative indeterminate mass in the RIGHT liver measures 22 mm (series 3, image 71). Mass in the LEFT liver is irregular in appearance and is indeterminate; it spans 38 mm (series 3, image 50). Portal vein is patent. Gallbladder is distended with likely cholelithiasis. Pancreas: Unremarkable. No pancreatic ductal dilatation or surrounding inflammatory changes. Spleen: There are multiple hypodense masses in the spleen, nonspecific. Multiple punctate calcifications throughout the spleen consistent with sequela of prior granulomatous infection. Adrenals/Urinary Tract: There is an indeterminate 24 mm RIGHT adrenal nodule. The kidneys enhance symmetrically. No hydronephrosis. No obstructive nephrolithiasis. Bladder is circumferentially thick-walled, most consistent with sequela of chronic outlet obstruction. Stomach/Bowel: No evidence of bowel obstruction. Appendix is normal. Mild wall thickening of the terminal ileum. Vascular/Lymphatic: Atherosclerotic calcifications. No suspicious lymphadenopathy visualized within the pelvis or retroperitoneum. Reproductive: Prostate is present. Other: Trace free fluid in the pelvis. Musculoskeletal: Lytic lesions involving L1, L3, the LEFT iliac crest, LEFT iliac adjacent to the SI joint, LEFT sacrum, and RIGHT iliac bone. IMPRESSION: 1. There is an irregular masslike opacity along the LEFT lower lobe hilar border with mild mass effect and apparent invasion of the bronchus at this site. Findings are concerning for primary lung malignancy. 2. Irregular low-density mediastinal and bilateral hilar adenopathy are concerning for metastatic nodal disease. 3. Innumerable hypodense masses throughout the liver, many of which are cysts but several which are  indeterminate and may reflect hepatic metastatic disease. These could be further characterized with dedicated MRI liver with contrast when and if clinically appropriate. 4. Indeterminate RIGHT adrenal nodule, suspicious for metastatic disease. This could be further assessed with MRI if clinically indicated. 5. There is osseous metastatic disease involving multiple vertebral bodies in the thoracic and lumbar spine, bilateral ribs, the LEFT scapula, the sternum and the LEFT greater than RIGHT pelvis. 6. Wedge shape opacity of the RIGHT upper lobe with suggestion of internal lucency. Findings could be infectious or inflammatory in etiology. Given wedge-shaped appearance, differential considerations include pulmonary infarction. No central pulmonary embolism within the limitations of this exam. 7. Additional pulmonary nodule within the LEFT upper lobe is indeterminate and may reflect additional site of disease. 8. Nonspecific oval mass in the RIGHT breast. This could be further assessed with dedicated diagnostic  mammogram and ultrasound if deemed clinically necessary. 9. Mild wall thickening of the terminal ileum, nonspecific. 10. Scattered hypodense masses throughout the spleen, nonspecific. Aortic Atherosclerosis (ICD10-I70.0). Electronically Signed   By: Meda Klinefelter M.D.   On: 02/09/2021 20:09   MR BRAIN W WO CONTRAST  Result Date: 02/11/2021 CLINICAL DATA:  Non-small cell lung cancer, staging EXAM: MRI HEAD WITHOUT AND WITH CONTRAST TECHNIQUE: Multiplanar, multiecho pulse sequences of the brain and surrounding structures were obtained without and with intravenous contrast. CONTRAST:  10mL GADAVIST GADOBUTROL 1 MMOL/ML IV SOLN COMPARISON:  None. FINDINGS: Brain: Encephalomalacia in the right occipital lobe and right para hippocampal region with surrounding gliosis, compatible with remote right PCA territory infarct. Additional mild scattered T2 hyperintensities in the white matter, nonspecific but  compatible with chronic microvascular ischemic disease. Mild for age generalized atrophy with ex vacuo ventricular dilation. No evidence of acute infarct, hydrocephalus, acute hemorrhage, mass lesion, midline shift, or extra-axial fluid collection. No abnormal enhancement. Vascular: Major arterial flow voids are maintained at the skull base. Small/non dominant right vertebral artery, which appears patent on post-contrast imaging. Skull and upper cervical spine: Normal marrow signal. Sinuses/Orbits: Mild paranasal sinus mucosal thickening. Unremarkable orbits. Other: No sizable mastoid effusions. IMPRESSION: 1. No evidence of acute intracranial abnormality or metastatic disease. 2. Remote right PCA territory infarct. 3. Mild for age chronic microvascular ischemic disease and atrophy. Electronically Signed   By: Feliberto Harts M.D.   On: 02/11/2021 19:43   MR LUMBAR SPINE WO CONTRAST  Result Date: 02/08/2021 CLINICAL DATA:  Low back pain, cancer suspected; technologist note states low back pain and bilateral leg weakness EXAM: MRI LUMBAR SPINE WITHOUT CONTRAST TECHNIQUE: Multiplanar, multisequence MR imaging of the lumbar spine was performed. No intravenous contrast was administered. COMPARISON:  None. FINDINGS: Segmentation:  Standard. Alignment:  No significant listhesis. Vertebrae: Vertebral body heights are maintained. There is patchy STIR hyperintensity at T12 and L1 including involvement of the right T12 pedicle with there may be some extraosseous extension. Patchy STIR hyperintensity at L3 eccentric to the right. Partially imaged possible STIR hyperintensity within the right sacrum. Overall decreased T1 marrow signal. Conus medullaris and cauda equina: Conus extends to the T12-L1 level. Conus and cauda equina appear normal. Paraspinal and other soft tissues: Unremarkable. Disc levels: L1-L2: Disc bulge eccentric to the right. No canal or foraminal stenosis. L2-L3: Disc bulge with endplate osteophytes.  Mild facet arthropathy with ligamentum flavum infolding. No canal or foraminal stenosis. L3-L4: Disc bulge with endplate osteophytes. Mild facet arthropathy with ligamentum flavum infolding. No canal or foraminal stenosis. L4-L5: Disc bulge with endplate osteophytic ridging. Marked facet arthropathy with ligamentum flavum infolding. Minor canal stenosis. Effacement of subarticular recesses. Mild right foraminal stenosis with facet spurring contacting the exiting nerve root. Mild left foraminal stenosis. L5-S1: Marked disc height loss with probable partial bridging. Disc bulge with endplate osteophytic ridging. Moderate facet arthropathy. No canal stenosis. Minor foraminal stenosis. IMPRESSION: Overall abnormal marrow signal with areas of more discrete abnormality. Suspect metastatic disease. Multilevel degenerative changes without high-grade stenosis. Electronically Signed   By: Guadlupe Spanish M.D.   On: 02/08/2021 12:38   CT ABDOMEN PELVIS W CONTRAST  Result Date: 02/09/2021 CLINICAL DATA:  Cancer of unknown primary, staging; Metastatic disease evaluation EXAM: CT CHEST, ABDOMEN, AND PELVIS WITH CONTRAST TECHNIQUE: Multidetector CT imaging of the chest, abdomen and pelvis was performed following the standard protocol during bolus administration of intravenous contrast. CONTRAST:  OMNIPAQUE IOHEXOL 350 MG/ML SOLN COMPARISON:  February 08, 2021  FINDINGS: CT CHEST FINDINGS Cardiovascular: Heart is enlarged. Three-vessel coronary artery atherosclerotic calcifications. No pericardial effusion. Atherosclerotic calcifications of the aorta. Mediastinum/Nodes: There is irregular low-density lymph nodes within the AP window with representative conglomeration of lymph nodes measuring 21 mm (series 3, image 23). Irregular low-density LEFT inferior hilar lymph node versus soft tissue mass measure 16 mm in the short axis (series 3, image 30). Prominent RIGHT paratracheal lymph node measures 10 mm in the short axis  (series 3, image 15). Coarsely calcified RIGHT mediastinal lymph nodes.There is a low-density RIGHT hilar lymph node which measures 12 mm in the short axis (series 3, image 30). No axillary adenopathy. Lungs/Pleura: Along the LEFT lower lobe hilum, there is low-density soft tissue which demonstrates mild mass effect on the adjacent bronchus. It tracks along the LEFT main pulmonary artery and spans approximately 32 by 17 mm in representative cross-sectional area (series 6, image 299). There is irregularity of the LEFT bronchus at the bifurcation in the region of this mass, concerning for bronchial wall invasion. No pleural effusion or pneumothorax. Bibasilar enhancing consolidative opacities most consistent with atelectasis. In the peripheral RIGHT upper lobe, there is a wedge-shaped opacity with scattered internal lucency which measures 22 mm (series 5, image 32). No large central pulmonary embolism. There is a rounded pulmonary nodule of the LEFT upper lobe which measures 8 x 6 mm (series 5, image 61). Musculoskeletal: There is an oval nodule in the upper inner RIGHT breast which measures 9 mm (series 3, image 30). There is osseous destruction of the sternum with adjacent soft tissue mass most consistent with osseous metastatic disease. There is osseous destruction of the RIGHT posterolateral sixth rib with a pathologic fracture and associated soft tissue mass. Osseous destruction of the posterolateral LEFT eighth and eleventh there is a lytic lesion of T12 which extends into the RIGHT posterior element rib. Osseous destruction with associated soft tissue mass at the LEFT scapula. CT ABDOMEN PELVIS FINDINGS Hepatobiliary: There are innumerable hypodense masses throughout the liver. Many of these masses demonstrate fluid attenuation and likely reflect hepatic cysts. However several are indeterminate and may reflect metastatic disease. Representative indeterminate mass in the RIGHT liver measures 22 mm (series 3,  image 71). Mass in the LEFT liver is irregular in appearance and is indeterminate; it spans 38 mm (series 3, image 50). Portal vein is patent. Gallbladder is distended with likely cholelithiasis. Pancreas: Unremarkable. No pancreatic ductal dilatation or surrounding inflammatory changes. Spleen: There are multiple hypodense masses in the spleen, nonspecific. Multiple punctate calcifications throughout the spleen consistent with sequela of prior granulomatous infection. Adrenals/Urinary Tract: There is an indeterminate 24 mm RIGHT adrenal nodule. The kidneys enhance symmetrically. No hydronephrosis. No obstructive nephrolithiasis. Bladder is circumferentially thick-walled, most consistent with sequela of chronic outlet obstruction. Stomach/Bowel: No evidence of bowel obstruction. Appendix is normal. Mild wall thickening of the terminal ileum. Vascular/Lymphatic: Atherosclerotic calcifications. No suspicious lymphadenopathy visualized within the pelvis or retroperitoneum. Reproductive: Prostate is present. Other: Trace free fluid in the pelvis. Musculoskeletal: Lytic lesions involving L1, L3, the LEFT iliac crest, LEFT iliac adjacent to the SI joint, LEFT sacrum, and RIGHT iliac bone. IMPRESSION: 1. There is an irregular masslike opacity along the LEFT lower lobe hilar border with mild mass effect and apparent invasion of the bronchus at this site. Findings are concerning for primary lung malignancy. 2. Irregular low-density mediastinal and bilateral hilar adenopathy are concerning for metastatic nodal disease. 3. Innumerable hypodense masses throughout the liver, many of which are cysts but several  which are indeterminate and may reflect hepatic metastatic disease. These could be further characterized with dedicated MRI liver with contrast when and if clinically appropriate. 4. Indeterminate RIGHT adrenal nodule, suspicious for metastatic disease. This could be further assessed with MRI if clinically indicated. 5.  There is osseous metastatic disease involving multiple vertebral bodies in the thoracic and lumbar spine, bilateral ribs, the LEFT scapula, the sternum and the LEFT greater than RIGHT pelvis. 6. Wedge shape opacity of the RIGHT upper lobe with suggestion of internal lucency. Findings could be infectious or inflammatory in etiology. Given wedge-shaped appearance, differential considerations include pulmonary infarction. No central pulmonary embolism within the limitations of this exam. 7. Additional pulmonary nodule within the LEFT upper lobe is indeterminate and may reflect additional site of disease. 8. Nonspecific oval mass in the RIGHT breast. This could be further assessed with dedicated diagnostic mammogram and ultrasound if deemed clinically necessary. 9. Mild wall thickening of the terminal ileum, nonspecific. 10. Scattered hypodense masses throughout the spleen, nonspecific. Aortic Atherosclerosis (ICD10-I70.0). Electronically Signed   By: Meda Klinefelter M.D.   On: 02/09/2021 20:09   MR LIVER W WO CONTRAST  Result Date: 02/11/2021 EXAM: MRI ABDOMEN WITHOUT AND WITH CONTRAST TECHNIQUE: Multiplanar multisequence MR imaging of the abdomen was performed both before and after the administration of intravenous contrast. CONTRAST:  10mL GADAVIST GADOBUTROL 1 MMOL/ML IV SOLN COMPARISON:  None. FINDINGS: Lower chest:  Lung bases are clear. Hepatobiliary: Multiple round cystic lesions LEFT hepatic lobe. These are complex cystic lesions with small peripheral crypts bordering the cystic complexes. These are hyperintense on T2 weighted imaging and low signal intensity on T1 weighted imaging. Lesions have thin peripheral enhancement. For example lesion in the RIGHT hepatic lobe measuring 4.2 by 3.2 cm (image 17/series 17). There is a conglomerate of cystic enhancing lesions in the lateral segment LEFT hepatic lobe measuring 5.9 x 4.3 cm with SUV max on image 18/21. Similar cystic lesion with multiple small  satellite cysts in the posterior RIGHT hepatic lobe measuring 3 cm on image 16/21. Pancreas: Normal pancreatic parenchymal intensity. No ductal dilatation or inflammation. Spleen: Similar cystic lesion with peripheral enhancement in the spleen measuring 18 mm on image 35/series 21. Adrenals/urinary tract: Peripheral enhancing lesion of the adrenal gland measuring 2.4 cm (image 38/series 21. Stomach/Bowel: Stomach and limited of the small bowel is unremarkable Vascular/Lymphatic: Abdominal aortic normal caliber. No retroperitoneal periportal lymphadenopathy. Musculoskeletal: There is an enhancing lesion in the L1 vertebral body (image 36/21. Small enhancing lesion in lower lumbar spine on image 73/21. Enlarged lesion of the posterior LEFT rib measuring 18 x 24 mm on image 40 4/21. IMPRESSION: 1. Unusual pattern of peripheral enhancing cystic complexes within LEFT and RIGHT hepatic lobe. 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 2. 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 3. Indeterminate lesion of the RIGHT adrenal gland concern for RIGHT adrenal metastasis. 4. Expansile lesion in the posterior LEFT rib is suggestive of metastatic lesion. Enhancing lesions within the spine lumbar spine are also concerning for metastatic lesions. Electronically Signed   By: Genevive Bi M.D.   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 Please perform CT-guided biopsy expansile lesion involving the lateral aspect of the right sixth rib for tissue diagnostic purposes. EXAM: CT-GUIDED BIOPSY OF EXPANSILE LESION INVOLVING LATERAL ASPECT OF THE RIGHT SIXTH RIB  COMPARISON:  CT the chest, abdomen and pelvis-02/09/2021 MEDICATIONS: None. ANESTHESIA/SEDATION: Moderate (conscious) sedation was employed during  this procedure as administered by the Interventional Radiology RN. A total of Versed 0.5 mg and Fentanyl 25 mcg was administered intravenously. Moderate Sedation Time: 12 minutes. The patient's level of consciousness and vital signs were monitored continuously by radiology nursing throughout the procedure under my direct supervision. CONTRAST:  None. COMPLICATIONS: None immediate. PROCEDURE: Informed consent was obtained from the patient following an explanation of the procedure, risks, benefits and alternatives. A time out was performed prior to the initiation of the procedure. The patient was positioned supine, slightly LPO on the CT table and a limited CT was performed for procedural planning demonstrating unchanged size and appearance the approximately 3.6 x 2.1 cm expansile lesion involving the lateral aspect of the right sixth rib (image 24, series 3). The procedure was planned. The operative site was prepped and draped in the usual sterile fashion. Appropriate trajectory was confirmed with a 22 gauge spinal needle after the adjacent tissues were anesthetized with 1% Lidocaine with epinephrine. Under intermittent CT guidance, a 17 gauge coaxial needle was advanced into the peripheral aspect of the mass. Appropriate positioning was confirmed and 6 core needle biopsy samples were obtained with an 18 gauge core needle biopsy device. The co-axial needle was removed and hemostasis was achieved with manual compression. A limited postprocedural CT was negative for pneumothorax, hemorrhage or additional complication. A dressing was applied. The patient tolerated the procedure well without immediate postprocedural complication. IMPRESSION: Technically successful CT guided core needle biopsy of expansile lesion involving the lateral aspect of the right sixth rib. Electronically Signed   By: Simonne Come M.D.   On: 02/12/2021 17:33      IMPRESSION/PLAN:***    On date of service, in total, I spent *** minutes on this  encounter. Patient was seen in person.   __________________________________________   Lonie Peak, MD  This document serves as a record of services personally performed by Lonie Peak, MD. It was created on her behalf by Neena Rhymes, a trained medical scribe. The creation of this record is based on the scribe's personal observations and the provider's statements to them. This document has been checked and approved by the attending provider.

## 2021-02-12 NOTE — Care Management Important Message (Signed)
Important Message  Patient Details IM Letter given to the Patient. Name: Robert [name] MRN: [id number] Date of Birth: 02/26/1941   Medicare Important Message Given:  Yes     [name] 02/12/2021, 12:37 PM

## 2021-02-12 NOTE — Discharge Summary (Signed)
Physician Discharge Summary  Robert [name] YNW:[id number] DOB: 10-30-1940 DOA: 02/08/2021  PCP: Farris Has, MD  Admit date: 02/08/2021 Discharge date: 02/12/2021  Admitted From: Home Disposition: Home  Recommendations for Outpatient Follow-up:  Follow up with PCP in 1-2 weeks Please obtain BMP/CBC in one week Please follow up with oncology as scheduled early next week 02/18/2021  Home Health: None Equipment/Devices: None  Discharge Condition: Stable CODE STATUS: Full full Diet recommendation: As tolerated, regular diet  Brief/Interim Summary: Robert [name] is a 80 y.o. 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 Admitted for pain control and oncology evaluation.  Multiple imaging studies this admission showed multiple lesions concerning for metastatic disease as below.  Oncology following is his radiation oncology, CT neck chest abdomen pelvis shows multiple lesions notably in the ribs spleen liver with lymphadenopathy diffusely concerning for lung primary, biopsy pending today with IR on the rib given its locality and ease of access.  Patient will follow-up with oncology next week planned on 02/18/2021 after biopsy results for further discussion and treatment about possible chemo, radiation and other treatments.  Unclear if this is related to patient's history of squamous cell carcinoma of the mouth although head and neck imaging appears to be unremarkable for any acute findings.  At this time patient otherwise stable and agreeable for discharge home, no medication changes as below other than recommend holding Xarelto for additional 48 hours postbiopsy to ensure resolution of bleeding.   Assessment & Plan:   Acute onset  lumbago Acute fracture/traumatic injury less likely Possible metastatic disease in the setting of previous history of oral squamous cell carcinoma Rule out new metastaic disease Continue over-the-counter medications, pain currently well controlled on current regimen including NSAIDs Oncology consulted - Dr Gwenyth Bouillon planning outpatient follow-up next week [facility] called - Dr Basilio Cairo aware of the patient -likely follow-up post oncology evaluation in the outpatient setting next week Biopsy of the rib today 02/12/2021, results pending for follow-up outpatient as above   Acute symptomatic hypercalcemia, improving Likely malignant in nature given below Calcium downtrending appropriately - currently WNL PTH WNL Continue Lasix/potassium, repeat labs in 1 week   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   Leukocytosis, likely reactive in the setting of above No fever, night sweats, rigors or chills. Hold antibiotics -no infectious process suspected Improving with supportive care/fluids/pain control Cultures remain negative   Permanent atrial fibrillation (HCC) CHADSVASc Score of at least 4. DC Xarelto in preparation for biopsy -resume in 48 hours Continue metoprolol.   HTN (hypertension) Continue metoprolol 100 mg p.o. twice daily. Monitor BP and heart rate.   Normocytic anemia Monitor H&H.  Transfuse as needed.    Discharge Instructions  Discharge Instructions     Diet - low sodium heart healthy   Complete by: As directed    Discharge wound care:   Complete by: As directed    Dressing change PRN   Increase activity slowly   Complete by: As directed       Allergies as of 02/12/2021   No Known Allergies      Medication List     TAKE these medications    docusate sodium 100 MG capsule Commonly known as:  COLACE Take 100 mg by mouth 2 (two) times daily as needed for mild  constipation.   furosemide 20 MG tablet Commonly known as: Lasix Take 1 tablet (20 mg total) by mouth daily.   metoprolol tartrate 100 MG tablet Commonly known as: LOPRESSOR Take 100 mg by mouth 2 (two) times daily.   potassium chloride SA 20 MEQ tablet Commonly known as: KLOR-CON Take 2 tablets (40 mEq total) by mouth daily.   rivaroxaban 20 MG Tabs tablet Commonly known as: Xarelto Take 1 tablet (20 mg total) by mouth daily with supper. Start taking on: February 14, 2021 What changed: These instructions start on February 14, 2021. If you are unsure what to do until then, ask your doctor or other care provider.               Discharge Care Instructions  (From admission, onward)           Start     Ordered   02/12/21 0000  Discharge wound care:       Comments: Dressing change PRN   02/12/21 1350            No Known Allergies  Consultations: Oncology, Dr. Shirline Frees, radiation oncology Dr. Basilio Cairo  Procedures/Studies: DG Lumbar Spine Complete  Result Date: 02/08/2021 CLINICAL DATA:  Low back pain extending into the left flank for 3 days appeared EXAM: LUMBAR SPINE - COMPLETE 4+ VIEW COMPARISON:  None. FINDINGS: Alignment is within normal limits. Moderate disc height loss at L5-S1. Mild disc height loss at L1-L2 and L2-L3. advanced facet degenerative changes seen throughout, greatest at L4-L5. IMPRESSION: No acute abnormality of the lumbar spine. Multilevel degenerative changes as above. Electronically Signed   By: Acquanetta Belling M.D.   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 CONTRAST:   OMNIPAQUE IOHEXOL 350 MG/ML SOLN COMPARISON:  08/15/2020 CT FINDINGS: Pharynx and larynx: Status post resection and treatment of a previously noted enhancing mass arising from the buccal surface of the right lower lip, with focal fat, likely related to reconstruction. No definite residual enhancing mass is seen. Salivary glands: No inflammation, mass, or stone. Thyroid: Unchanged mild enlargement of the thyroid. A previously noted central hypodense lesion is not appreciated on the current exam. Lymph nodes: Status post bilateral neck dissection. No cervical lymphadenopathy. For mediastinal lymph nodes, please see same-day CT chest Vascular: Negative. Limited intracranial: Negative. Visualized orbits: Negative. Mastoids and visualized paranasal sinuses: Mucosal thickening in the right maxillary sinus. Otherwise negative. Skeleton: No acute osseous abnormality in the cervical spine. Status post partial resection of the mandible Upper chest: Please see same-day CT chest Other: Skin thickening overlying the neck, likely sequela of prior radiation. IMPRESSION: 1. Status post resection of and radiation therapy to an enhancing mass arising from the buccal surface of the right lower lip, with no residual enhancing mass seen. Reconstructive changes. No evidence of local recurrence. 2. Status post bilateral neck dissection. No cervical lymphadenopathy. 3. For findings in the chest, including mediastinal lymph nodes, please see same-day CT chest. Electronically Signed   By: Wiliam Ke M.D.   On: 02/09/2021 20:06   CT CHEST W CONTRAST  Result Date: 02/09/2021  CLINICAL DATA:  Cancer of unknown primary, staging; Metastatic disease evaluation EXAM: CT CHEST, ABDOMEN, AND PELVIS WITH CONTRAST TECHNIQUE: Multidetector CT imaging of the chest, abdomen and pelvis was performed following the standard protocol during bolus administration of intravenous contrast. CONTRAST:  OMNIPAQUE IOHEXOL 350 MG/ML SOLN COMPARISON:   February 08, 2021 FINDINGS: CT CHEST FINDINGS Cardiovascular: Heart is enlarged. Three-vessel coronary artery atherosclerotic calcifications. No pericardial effusion. Atherosclerotic calcifications of the aorta. Mediastinum/Nodes: There is irregular low-density lymph nodes within the AP window with representative conglomeration of lymph nodes measuring 21 mm (series 3, image 23). Irregular low-density LEFT inferior hilar lymph node versus soft tissue mass measure 16 mm in the short axis (series 3, image 30). Prominent RIGHT paratracheal lymph node measures 10 mm in the short axis (series 3, image 15). Coarsely calcified RIGHT mediastinal lymph nodes.There is a low-density RIGHT hilar lymph node which measures 12 mm in the short axis (series 3, image 30). No axillary adenopathy. Lungs/Pleura: Along the LEFT lower lobe hilum, there is low-density soft tissue which demonstrates mild mass effect on the adjacent bronchus. It tracks along the LEFT main pulmonary artery and spans approximately 32 by 17 mm in representative cross-sectional area (series 6, image 299). There is irregularity of the LEFT bronchus at the bifurcation in the region of this mass, concerning for bronchial wall invasion. No pleural effusion or pneumothorax. Bibasilar enhancing consolidative opacities most consistent with atelectasis. In the peripheral RIGHT upper lobe, there is a wedge-shaped opacity with scattered internal lucency which measures 22 mm (series 5, image 32). No large central pulmonary embolism. There is a rounded pulmonary nodule of the LEFT upper lobe which measures 8 x 6 mm (series 5, image 61). Musculoskeletal: There is an oval nodule in the upper inner RIGHT breast which measures 9 mm (series 3, image 30). There is osseous destruction of the sternum with adjacent soft tissue mass most consistent with osseous metastatic disease. There is osseous destruction of the RIGHT posterolateral sixth rib with a pathologic fracture and  associated soft tissue mass. Osseous destruction of the posterolateral LEFT eighth and eleventh there is a lytic lesion of T12 which extends into the RIGHT posterior element rib. Osseous destruction with associated soft tissue mass at the LEFT scapula. CT ABDOMEN PELVIS FINDINGS Hepatobiliary: There are innumerable hypodense masses throughout the liver. Many of these masses demonstrate fluid attenuation and likely reflect hepatic cysts. However several are indeterminate and may reflect metastatic disease. Representative indeterminate mass in the RIGHT liver measures 22 mm (series 3, image 71). Mass in the LEFT liver is irregular in appearance and is indeterminate; it spans 38 mm (series 3, image 50). Portal vein is patent. Gallbladder is distended with likely cholelithiasis. Pancreas: Unremarkable. No pancreatic ductal dilatation or surrounding inflammatory changes. Spleen: There are multiple hypodense masses in the spleen, nonspecific. Multiple punctate calcifications throughout the spleen consistent with sequela of prior granulomatous infection. Adrenals/Urinary Tract: There is an indeterminate 24 mm RIGHT adrenal nodule. The kidneys enhance symmetrically. No hydronephrosis. No obstructive nephrolithiasis. Bladder is circumferentially thick-walled, most consistent with sequela of chronic outlet obstruction. Stomach/Bowel: No evidence of bowel obstruction. Appendix is normal. Mild wall thickening of the terminal ileum. Vascular/Lymphatic: Atherosclerotic calcifications. No suspicious lymphadenopathy visualized within the pelvis or retroperitoneum. Reproductive: Prostate is present. Other: Trace free fluid in the pelvis. Musculoskeletal: Lytic lesions involving L1, L3, the LEFT iliac crest, LEFT iliac adjacent to the SI joint, LEFT sacrum, and RIGHT iliac bone. IMPRESSION: 1. There is an irregular masslike opacity along  the LEFT lower lobe hilar border with mild mass effect and apparent invasion of the bronchus at  this site. Findings are concerning for primary lung malignancy. 2. Irregular low-density mediastinal and bilateral hilar adenopathy are concerning for metastatic nodal disease. 3. Innumerable hypodense masses throughout the liver, many of which are cysts but several which are indeterminate and may reflect hepatic metastatic disease. These could be further characterized with dedicated MRI liver with contrast when and if clinically appropriate. 4. Indeterminate RIGHT adrenal nodule, suspicious for metastatic disease. This could be further assessed with MRI if clinically indicated. 5. There is osseous metastatic disease involving multiple vertebral bodies in the thoracic and lumbar spine, bilateral ribs, the LEFT scapula, the sternum and the LEFT greater than RIGHT pelvis. 6. Wedge shape opacity of the RIGHT upper lobe with suggestion of internal lucency. Findings could be infectious or inflammatory in etiology. Given wedge-shaped appearance, differential considerations include pulmonary infarction. No central pulmonary embolism within the limitations of this exam. 7. Additional pulmonary nodule within the LEFT upper lobe is indeterminate and may reflect additional site of disease. 8. Nonspecific oval mass in the RIGHT breast. This could be further assessed with dedicated diagnostic mammogram and ultrasound if deemed clinically necessary. 9. Mild wall thickening of the terminal ileum, nonspecific. 10. Scattered hypodense masses throughout the spleen, nonspecific. Aortic Atherosclerosis (ICD10-I70.0). Electronically Signed   By: Meda Klinefelter M.D.   On: 02/09/2021 20:09   MR BRAIN W WO CONTRAST  Result Date: 02/11/2021 CLINICAL DATA:  Non-small cell lung cancer, staging EXAM: MRI HEAD WITHOUT AND WITH CONTRAST TECHNIQUE: Multiplanar, multiecho pulse sequences of the brain and surrounding structures were obtained without and with intravenous contrast. CONTRAST:  10mL GADAVIST GADOBUTROL 1 MMOL/ML IV SOLN  COMPARISON:  None. FINDINGS: Brain: Encephalomalacia in the right occipital lobe and right para hippocampal region with surrounding gliosis, compatible with remote right PCA territory infarct. Additional mild scattered T2 hyperintensities in the white matter, nonspecific but compatible with chronic microvascular ischemic disease. Mild for age generalized atrophy with ex vacuo ventricular dilation. No evidence of acute infarct, hydrocephalus, acute hemorrhage, mass lesion, midline shift, or extra-axial fluid collection. No abnormal enhancement. Vascular: Major arterial flow voids are maintained at the skull base. Small/non dominant right vertebral artery, which appears patent on post-contrast imaging. Skull and upper cervical spine: Normal marrow signal. Sinuses/Orbits: Mild paranasal sinus mucosal thickening. Unremarkable orbits. Other: No sizable mastoid effusions. IMPRESSION: 1. No evidence of acute intracranial abnormality or metastatic disease. 2. Remote right PCA territory infarct. 3. Mild for age chronic microvascular ischemic disease and atrophy. Electronically Signed   By: Feliberto Harts M.D.   On: 02/11/2021 19:43   MR LUMBAR SPINE WO CONTRAST  Result Date: 02/08/2021 CLINICAL DATA:  Low back pain, cancer suspected; technologist note states low back pain and bilateral leg weakness EXAM: MRI LUMBAR SPINE WITHOUT CONTRAST TECHNIQUE: Multiplanar, multisequence MR imaging of the lumbar spine was performed. No intravenous contrast was administered. COMPARISON:  None. FINDINGS: Segmentation:  Standard. Alignment:  No significant listhesis. Vertebrae: Vertebral body heights are maintained. There is patchy STIR hyperintensity at T12 and L1 including involvement of the right T12 pedicle with there may be some extraosseous extension. Patchy STIR hyperintensity at L3 eccentric to the right. Partially imaged possible STIR hyperintensity within the right sacrum. Overall decreased T1 marrow signal. Conus  medullaris and cauda equina: Conus extends to the T12-L1 level. Conus and cauda equina appear normal. Paraspinal and other soft tissues: Unremarkable. Disc levels: L1-L2: Disc bulge eccentric to  the right. No canal or foraminal stenosis. L2-L3: Disc bulge with endplate osteophytes. Mild facet arthropathy with ligamentum flavum infolding. No canal or foraminal stenosis. L3-L4: Disc bulge with endplate osteophytes. Mild facet arthropathy with ligamentum flavum infolding. No canal or foraminal stenosis. L4-L5: Disc bulge with endplate osteophytic ridging. Marked facet arthropathy with ligamentum flavum infolding. Minor canal stenosis. Effacement of subarticular recesses. Mild right foraminal stenosis with facet spurring contacting the exiting nerve root. Mild left foraminal stenosis. L5-S1: Marked disc height loss with probable partial bridging. Disc bulge with endplate osteophytic ridging. Moderate facet arthropathy. No canal stenosis. Minor foraminal stenosis. IMPRESSION: Overall abnormal marrow signal with areas of more discrete abnormality. Suspect metastatic disease. Multilevel degenerative changes without high-grade stenosis. Electronically Signed   By: Guadlupe Spanish M.D.   On: 02/08/2021 12:38   CT ABDOMEN PELVIS W CONTRAST  Result Date: 02/09/2021 CLINICAL DATA:  Cancer of unknown primary, staging; Metastatic disease evaluation EXAM: CT CHEST, ABDOMEN, AND PELVIS WITH CONTRAST TECHNIQUE: Multidetector CT imaging of the chest, abdomen and pelvis was performed following the standard protocol during bolus administration of intravenous contrast. CONTRAST:  OMNIPAQUE IOHEXOL 350 MG/ML SOLN COMPARISON:  February 08, 2021 FINDINGS: CT CHEST FINDINGS Cardiovascular: Heart is enlarged. Three-vessel coronary artery atherosclerotic calcifications. No pericardial effusion. Atherosclerotic calcifications of the aorta. Mediastinum/Nodes: There is irregular low-density lymph nodes within the AP window with  representative conglomeration of lymph nodes measuring 21 mm (series 3, image 23). Irregular low-density LEFT inferior hilar lymph node versus soft tissue mass measure 16 mm in the short axis (series 3, image 30). Prominent RIGHT paratracheal lymph node measures 10 mm in the short axis (series 3, image 15). Coarsely calcified RIGHT mediastinal lymph nodes.There is a low-density RIGHT hilar lymph node which measures 12 mm in the short axis (series 3, image 30). No axillary adenopathy. Lungs/Pleura: Along the LEFT lower lobe hilum, there is low-density soft tissue which demonstrates mild mass effect on the adjacent bronchus. It tracks along the LEFT main pulmonary artery and spans approximately 32 by 17 mm in representative cross-sectional area (series 6, image 299). There is irregularity of the LEFT bronchus at the bifurcation in the region of this mass, concerning for bronchial wall invasion. No pleural effusion or pneumothorax. Bibasilar enhancing consolidative opacities most consistent with atelectasis. In the peripheral RIGHT upper lobe, there is a wedge-shaped opacity with scattered internal lucency which measures 22 mm (series 5, image 32). No large central pulmonary embolism. There is a rounded pulmonary nodule of the LEFT upper lobe which measures 8 x 6 mm (series 5, image 61). Musculoskeletal: There is an oval nodule in the upper inner RIGHT breast which measures 9 mm (series 3, image 30). There is osseous destruction of the sternum with adjacent soft tissue mass most consistent with osseous metastatic disease. There is osseous destruction of the RIGHT posterolateral sixth rib with a pathologic fracture and associated soft tissue mass. Osseous destruction of the posterolateral LEFT eighth and eleventh there is a lytic lesion of T12 which extends into the RIGHT posterior element rib. Osseous destruction with associated soft tissue mass at the LEFT scapula. CT ABDOMEN PELVIS FINDINGS Hepatobiliary: There are  innumerable hypodense masses throughout the liver. Many of these masses demonstrate fluid attenuation and likely reflect hepatic cysts. However several are indeterminate and may reflect metastatic disease. Representative indeterminate mass in the RIGHT liver measures 22 mm (series 3, image 71). Mass in the LEFT liver is irregular in appearance and is indeterminate; it spans 38 mm (series  3, image 50). Portal vein is patent. Gallbladder is distended with likely cholelithiasis. Pancreas: Unremarkable. No pancreatic ductal dilatation or surrounding inflammatory changes. Spleen: There are multiple hypodense masses in the spleen, nonspecific. Multiple punctate calcifications throughout the spleen consistent with sequela of prior granulomatous infection. Adrenals/Urinary Tract: There is an indeterminate 24 mm RIGHT adrenal nodule. The kidneys enhance symmetrically. No hydronephrosis. No obstructive nephrolithiasis. Bladder is circumferentially thick-walled, most consistent with sequela of chronic outlet obstruction. Stomach/Bowel: No evidence of bowel obstruction. Appendix is normal. Mild wall thickening of the terminal ileum. Vascular/Lymphatic: Atherosclerotic calcifications. No suspicious lymphadenopathy visualized within the pelvis or retroperitoneum. Reproductive: Prostate is present. Other: Trace free fluid in the pelvis. Musculoskeletal: Lytic lesions involving L1, L3, the LEFT iliac crest, LEFT iliac adjacent to the SI joint, LEFT sacrum, and RIGHT iliac bone. IMPRESSION: 1. There is an irregular masslike opacity along the LEFT lower lobe hilar border with mild mass effect and apparent invasion of the bronchus at this site. Findings are concerning for primary lung malignancy. 2. Irregular low-density mediastinal and bilateral hilar adenopathy are concerning for metastatic nodal disease. 3. Innumerable hypodense masses throughout the liver, many of which are cysts but several which are indeterminate and may reflect  hepatic metastatic disease. These could be further characterized with dedicated MRI liver with contrast when and if clinically appropriate. 4. Indeterminate RIGHT adrenal nodule, suspicious for metastatic disease. This could be further assessed with MRI if clinically indicated. 5. There is osseous metastatic disease involving multiple vertebral bodies in the thoracic and lumbar spine, bilateral ribs, the LEFT scapula, the sternum and the LEFT greater than RIGHT pelvis. 6. Wedge shape opacity of the RIGHT upper lobe with suggestion of internal lucency. Findings could be infectious or inflammatory in etiology. Given wedge-shaped appearance, differential considerations include pulmonary infarction. No central pulmonary embolism within the limitations of this exam. 7. Additional pulmonary nodule within the LEFT upper lobe is indeterminate and may reflect additional site of disease. 8. Nonspecific oval mass in the RIGHT breast. This could be further assessed with dedicated diagnostic mammogram and ultrasound if deemed clinically necessary. 9. Mild wall thickening of the terminal ileum, nonspecific. 10. Scattered hypodense masses throughout the spleen, nonspecific. Aortic Atherosclerosis (ICD10-I70.0). Electronically Signed   By: Meda Klinefelter M.D.   On: 02/09/2021 20:09   MR LIVER W WO CONTRAST  Result Date: 02/11/2021 EXAM: MRI ABDOMEN WITHOUT AND WITH CONTRAST TECHNIQUE: Multiplanar multisequence MR imaging of the abdomen was performed both before and after the administration of intravenous contrast. CONTRAST:  10mL GADAVIST GADOBUTROL 1 MMOL/ML IV SOLN COMPARISON:  None. FINDINGS: Lower chest:  Lung bases are clear. Hepatobiliary: Multiple round cystic lesions LEFT hepatic lobe. These are complex cystic lesions with small peripheral crypts bordering the cystic complexes. These are hyperintense on T2 weighted imaging and low signal intensity on T1 weighted imaging. Lesions have thin peripheral enhancement.  For example lesion in the RIGHT hepatic lobe measuring 4.2 by 3.2 cm (image 17/series 17). There is a conglomerate of cystic enhancing lesions in the lateral segment LEFT hepatic lobe measuring 5.9 x 4.3 cm with SUV max on image 18/21. Similar cystic lesion with multiple small satellite cysts in the posterior RIGHT hepatic lobe measuring 3 cm on image 16/21. Pancreas: Normal pancreatic parenchymal intensity. No ductal dilatation or inflammation. Spleen: Similar cystic lesion with peripheral enhancement in the spleen measuring 18 mm on image 35/series 21. Adrenals/urinary tract: Peripheral enhancing lesion of the adrenal gland measuring 2.4 cm (image 38/series 21. Stomach/Bowel: Stomach and limited  of the small bowel is unremarkable Vascular/Lymphatic: Abdominal aortic normal caliber. No retroperitoneal periportal lymphadenopathy. Musculoskeletal: There is an enhancing lesion in the L1 vertebral body (image 36/21. Small enhancing lesion in lower lumbar spine on image 73/21. Enlarged lesion of the posterior LEFT rib measuring 18 x 24 mm on image 40 4/21. IMPRESSION: 1. Unusual pattern of peripheral enhancing cystic complexes within LEFT and RIGHT hepatic lobe. 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 2. 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 3. Indeterminate lesion of the RIGHT adrenal gland concern for RIGHT adrenal metastasis. 4. Expansile lesion in the posterior LEFT rib is suggestive of metastatic lesion. Enhancing lesions within the spine lumbar spine are also concerning for metastatic lesions. Electronically Signed   By: Genevive Bi M.D.   On: 02/11/2021 22:03     Subjective: No acute issues or events overnight denies nausea vomiting diarrhea constipation headache fevers chills chest pain.  Only complaint today is of hunger given his n.p.o. status for  procedure   Discharge Exam: Vitals:   02/12/21 0833 02/12/21 1336  BP: 106/73 101/60  Pulse: (!) 102 (!) 105  Resp: 16 16  Temp: 97.9 F (36.6 C)   SpO2: 99% 100%   Vitals:   02/12/21 0215 02/12/21 0534 02/12/21 0833 02/12/21 1336  BP: 110/63 120/75 106/73 101/60  Pulse: (!) 109 (!) 105 (!) 102 (!) 105  Resp: 16 16 16 16   Temp: 98.3 F (36.8 C) 98 F (36.7 C) 97.9 F (36.6 C)   TempSrc: Oral Oral Oral   SpO2: 96% 95% 99% 100%  Weight:      Height:        General: Pt is alert, awake, not in acute distress Cardiovascular: RRR, S1/S2 +, no rubs, no gallops Respiratory: CTA bilaterally, no wheezing, no rhonchi Abdominal: Soft, NT, ND, bowel sounds + Extremities: no edema, no cyanosis    The results of significant diagnostics from this hospitalization (including imaging, microbiology, ancillary and laboratory) are listed below for reference.     Microbiology: Recent Results (from the past 240 hour(s))  Blood culture (routine x 2)     Status: None (Preliminary result)   Collection Time: 02/08/21  6:48 PM   Specimen: BLOOD  Result Value Ref Range Status   Specimen Description   Final    BLOOD RIGHT ANTECUBITAL Performed at [facility], [address] [place], [place] [address]    Special Requests   Final    BOTTLES DRAWN AEROBIC ONLY Blood Culture adequate volume Performed at [facility], [address] [place], [place] [address]    Culture   Final    NO GROWTH 4 DAYS Performed at [facility], [address] [place], [place] [address]    Report Status PENDING  Incomplete  Blood culture (routine x 2)     Status: None (Preliminary result)   Collection Time: 02/08/21  6:48 PM   Specimen: BLOOD  Result Value Ref Range Status   Specimen Description   Final    BLOOD BLOOD RIGHT ARM Performed at [facility], [address] [place], [place] [address]    Special Requests   Final    BOTTLES DRAWN  AEROBIC ONLY Blood Culture adequate volume Performed at [facility], [address], [place], [place] [address]    Culture   Final    NO GROWTH 4 DAYS Performed  at [facility], [address] [place], [place] [address]    Report Status PENDING  Incomplete     Labs: BNP (last 3 results) No results for input(s): BNP in the last 8760 hours. Basic Metabolic Panel: Recent Labs  Lab 02/08/21 1106 02/09/21 0535 02/10/21 0434 02/11/21 0438 02/12/21 0515  NA 136 137 131* 130* 130*  K 3.8 3.6 2.6* 3.0* 3.2*  CL 97* 98 90* 92* 95*  CO2 31 34* 33* 34* 29  GLUCOSE 124* 93 93 91 103*  BUN 28* 16 17 22 18   CREATININE 1.26* 0.96 0.88 0.86 0.82  CALCIUM 13.4* 11.6* 10.1 9.3 8.9  PHOS  --  2.4* 2.3*  --   --    Liver Function Tests: Recent Labs  Lab 02/08/21 1106 02/09/21 0535 02/10/21 0434  AST 32  --   --   ALT 25  --   --   ALKPHOS 136*  --   --   BILITOT 0.7  --   --   PROT 8.5*  --   --   ALBUMIN 3.6 2.8* 2.6*   No results for input(s): LIPASE, AMYLASE in the last 168 hours. No results for input(s): AMMONIA in the last 168 hours. CBC: Recent Labs  Lab 02/08/21 1106 02/09/21 0535 02/10/21 0434 02/11/21 0438 02/12/21 0515  WBC 38.2* 22.9* 13.7* 21.9* 19.0*  NEUTROABS 34.8* 20.9* 12.7* 19.8* 17.2*  HGB 12.5* 10.9* 11.3* 11.0* 10.8*  HCT 39.8 35.4* 35.9* 34.1* 33.7*  MCV 89.0 90.1 87.1 86.8 86.4  PLT 356 235 203 182 191   Cardiac Enzymes: No results for input(s): CKTOTAL, CKMB, CKMBINDEX, TROPONINI in the last 168 hours. BNP: Invalid input(s): POCBNP CBG: No results for input(s): GLUCAP in the last 168 hours. D-Dimer No results for input(s): DDIMER in the last 72 hours. Hgb A1c No results for input(s): HGBA1C in the last 72 hours. Lipid Profile No results for input(s): CHOL, HDL, LDLCALC, TRIG, CHOLHDL, LDLDIRECT in the last 72 hours. Thyroid function studies No results for input(s): TSH, T4TOTAL, T3FREE, THYROIDAB in the last 72  hours.  Invalid input(s): FREET3 Anemia work up No results for input(s): VITAMINB12, FOLATE, FERRITIN, TIBC, IRON, RETICCTPCT in the last 72 hours. Urinalysis    Component Value Date/Time   COLORURINE YELLOW 02/08/2021 1018   APPEARANCEUR CLEAR 02/08/2021 1018   LABSPEC 1.016 02/08/2021 1018   PHURINE 5.0 02/08/2021 1018   GLUCOSEU NEGATIVE 02/08/2021 1018   HGBUR NEGATIVE 02/08/2021 1018   BILIRUBINUR NEGATIVE 02/08/2021 1018   KETONESUR 5 (A) 02/08/2021 1018   PROTEINUR NEGATIVE 02/08/2021 1018   NITRITE NEGATIVE 02/08/2021 1018   LEUKOCYTESUR NEGATIVE 02/08/2021 1018   Sepsis Labs Invalid input(s): PROCALCITONIN,  WBC,  LACTICIDVEN Microbiology Recent Results (from the past 240 hour(s))  Blood culture (routine x 2)     Status: None (Preliminary result)   Collection Time: 02/08/21  6:48 PM   Specimen: BLOOD  Result Value Ref Range Status   Specimen Description   Final    BLOOD RIGHT ANTECUBITAL Performed at [facility], [address], [place], [place] [address]    Special Requests   Final    BOTTLES DRAWN AEROBIC ONLY Blood Culture adequate volume Performed at [facility], [address], [place], [place] [address]    Culture   Final    NO GROWTH 4 DAYS Performed at [facility], [address], [place], [place] [address]    Report Status PENDING  Incomplete  Blood culture (routine x 2)     Status: None (Preliminary result)   Collection Time: 02/08/21  6:48 PM   Specimen: BLOOD  Result Value Ref Range Status   Specimen Description   Final    BLOOD BLOOD RIGHT ARM Performed at [facility], [address], [place], [place] [address]    Special Requests   Final    BOTTLES DRAWN AEROBIC ONLY Blood Culture adequate volume Performed at [facility], [address], [place], [place] [address]    Culture   Final    NO GROWTH 4 DAYS Performed at [facility], [address], [place], [place] [address]    Report Status PENDING  Incomplete     Time coordinating discharge: Over 30 minutes  SIGNED:   Azucena Fallen, DO [facility] 02/12/2021, 1:51 PM Pager   If 7PM-7AM, please contact night-coverage [email or web address]

## 2021-02-13 ENCOUNTER — Ambulatory Visit
Admission: RE | Admit: 2021-02-13 | Discharge: 2021-02-13 | Disposition: A | Discharge status: Home / Self Care | Payer: Medicare Other | Source: Ambulatory Visit | Attending: Radiation Oncology | Admitting: Radiation Oncology

## 2021-02-13 ENCOUNTER — Ambulatory Visit: Payer: Medicare Other | Admitting: Radiation Oncology

## 2021-02-13 ENCOUNTER — Other Ambulatory Visit: Payer: Self-pay

## 2021-02-13 VITALS — BP 96/61 | HR 60 | Temp 97.7°F | Resp 20 | Ht 71.5 in | Wt 206.8 lb

## 2021-02-13 DIAGNOSIS — I700 Atherosclerosis of aorta: Secondary | ICD-10-CM | POA: Diagnosis not present

## 2021-02-13 DIAGNOSIS — Z483 Aftercare following surgery for neoplasm: Secondary | ICD-10-CM | POA: Insufficient documentation

## 2021-02-13 DIAGNOSIS — C760 Malignant neoplasm of head, face and neck: Secondary | ICD-10-CM | POA: Diagnosis not present

## 2021-02-13 DIAGNOSIS — C7951 Secondary malignant neoplasm of bone: Secondary | ICD-10-CM | POA: Diagnosis not present

## 2021-02-13 DIAGNOSIS — C031 Malignant neoplasm of lower gum: Secondary | ICD-10-CM | POA: Insufficient documentation

## 2021-02-13 DIAGNOSIS — Z510 Encounter for antineoplastic radiation therapy: Secondary | ICD-10-CM | POA: Insufficient documentation

## 2021-02-13 DIAGNOSIS — C801 Malignant (primary) neoplasm, unspecified: Secondary | ICD-10-CM | POA: Diagnosis not present

## 2021-02-13 DIAGNOSIS — E279 Disorder of adrenal gland, unspecified: Secondary | ICD-10-CM | POA: Diagnosis not present

## 2021-02-13 DIAGNOSIS — I890 Lymphedema, not elsewhere classified: Secondary | ICD-10-CM | POA: Insufficient documentation

## 2021-02-13 DIAGNOSIS — Z923 Personal history of irradiation: Secondary | ICD-10-CM | POA: Diagnosis not present

## 2021-02-13 DIAGNOSIS — R293 Abnormal posture: Secondary | ICD-10-CM | POA: Insufficient documentation

## 2021-02-13 DIAGNOSIS — Z79899 Other long term (current) drug therapy: Secondary | ICD-10-CM | POA: Diagnosis not present

## 2021-02-13 DIAGNOSIS — R591 Generalized enlarged lymph nodes: Secondary | ICD-10-CM | POA: Diagnosis not present

## 2021-02-13 DIAGNOSIS — R911 Solitary pulmonary nodule: Secondary | ICD-10-CM | POA: Diagnosis not present

## 2021-02-13 LAB — CULTURE, BLOOD (ROUTINE X 2)
Culture: NO GROWTH
Culture: NO GROWTH
Special Requests: ADEQUATE
Special Requests: ADEQUATE

## 2021-02-13 MED ORDER — ACETAMINOPHEN 160 MG/5ML PO SUSP: 960.0000 mg | Freq: Four times a day (QID) | ORAL | 0 refills | Status: DC | PRN

## 2021-02-13 NOTE — Progress Notes (Signed)
Radiation Oncology         [phone]) [phone] ________________________________  Outpatient ReConsultation  Name: Robert [name] MRN: [id number]  Date: 02/13/2021  DOB: 1940/07/15  CC:Farris Has, MD  Azucena Fallen, MD   REFERRING PHYSICIAN: Azucena Fallen, MD  DIAGNOSIS:    ICD-10-CM   1. Metastatic cancer to spine (HCC)  C79.51 acetaminophen (TYLENOL) 160 MG/5ML suspension    2. 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 Bone metastases (HCC)  C79.51         Cancer Staging Cancer of lower gum (HCC) Staging form: Oral Cavity, AJCC 8th Edition - Pathologic stage from 10/05/2020: Stage IVA (pT4a, pN2a, cM0) - Signed by Lonie Peak, MD on 10/05/2020 Stage prefix: Initial diagnosis Histologic grade (G): G2 Histologic grading system: 3 grade system Presence of extranodal extension: Present Perineural invasion (PNI): Present (Now with Stage IV metastatic disease)  CHIEF COMPLAINT: Here to discuss management of metastatic cancer  HISTORY OF PRESENT ILLNESS::Robert [name] is a 80 y.o. male who has a history of locally advanced oral cavity cancer and presented to the ED on 02/08/21 with progressively worse back pain for the 2 weeks. MRI performed in the ED concerning for metastatic disease to lumbar spine, showing overall abnormal marrow signal with areas of more discrete abnormality. Patient was admitted for pain control and oncology evaluation by Dr. Arbutus Ped.  I spoke with Dr. Natale Milch soon after his admission.  I reviewed the MRI and recommended that he undergo CT imaging to restage his body  Pertinent imaging (I have personally reviewed this myself) while the patient was admitted:  --CT of the neck on 02/09/21 demonstrated post resection of and radiation therapy to an enhancing mass arising from the buccal surface of the right lower lip, with no residual enhancing mass seen. Reconstructive changes were also seen and no evidence of local recurrence.  --CT of the chest  abdomen and pelvis on 02/09/21 demonstrated an irregular mass like opacity along the left lower lobe hilar border with mild mass effect and apparent invasion of the bronchus, noted as concerning or primary lung malignancy. Irregular low density mediastinal and bilateral hilar adenopathy was seen and noted as concerning for metastatic nodal disease. In addition, innumerable hypodense masses throughout the liver were seen, many of which were cysts but several of which were indeterminate and noted to possibly reflect hepatic metastatic disease. An indeterminate right adrenal nodule was seen as well, also suspicious for metastatic disease. Osseous metastatic disease was seen involving multiple vertebral bodies in the thoracic and lumbar spine, bilateral ribs, the left scapula, the sternum and the left greater than right pelvis. An additional pulmonary nodule within the left upper lobe is indeterminate and noted to possibly reflect an additional site of disease. A nonspecific oval mass in the right breast was appreciated as well. --MRI of the liver on 02/11/21 demonstrated an unusual pattern of peripheral enhancing cystic complexes within left and right hepatic lobe. 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 The multiplicity of lesion was noted to possibly favor hepatic metastasis. A similar cystic lesion within the spleen was seen to favor cystic metastasis. An indeterminate lesion was also seen of the right adrenal gland concerning for right adrenal metastasis, and an expansile lesion in the posterior left rib was seen as suggestive of metastatic lesion. Enhancing lesions within the lumbar spine were also seen as concerning for metastatic lesion. --MRI of the brain on 02/11/21 demonstrated no evidence of acute intracranial abnormality  or metastatic disease.    The patient presents to me today for evaluation of his pain and consideration of palliative radiation.  He had a  biopsy of a right rib lesion yesterday and those results are pending.  He reports that his pain is in the lower thoracic and upper lumbar region as well as the left scapula and left rib cage.  Additionally he has pain in the sternal region.  He is discouraged by the most recent imaging and has questions about his prognosis and status of his disease  He would like to avoid narcotics if possible.  He has been taking Tylenol   He is able to ambulate with a walker  PREVIOUS RADIATION THERAPY: Yes he received postoperative radiation to the oral cavity (mandibular gum) and bilateral neck receiving a total dose of 66 Gray in 33 fractions and he completed this 11/29/2020  PAST MEDICAL HISTORY:  has a past medical history of A-fib (HCC) and HTN (hypertension).    PAST SURGICAL HISTORY: Past Surgical History:  Procedure Laterality Date   DENTAL SURGERY      FAMILY HISTORY: family history includes COPD in his mother; Hypertension in his father.  SOCIAL HISTORY:  reports that he has never smoked. He has quit using smokeless tobacco.  His smokeless tobacco use included chew. He reports current alcohol use. He reports that he does not use drugs.  ALLERGIES: Patient has no known allergies.  MEDICATIONS:  Current Outpatient Medications  Medication Sig Dispense Refill   acetaminophen (TYLENOL) 160 MG/5ML suspension Take 30 mLs (960 mg total) by mouth every 6 (six) hours as needed for moderate pain. 1000 mL 0   docusate sodium (COLACE) 100 MG capsule Take 100 mg by mouth 2 (two) times daily as needed for mild constipation.     furosemide (LASIX) 20 MG tablet Take 1 tablet (20 mg total) by mouth daily. 30 tablet 0   metoprolol tartrate (LOPRESSOR) 100 MG tablet Take 100 mg by mouth 2 (two) times daily.     potassium chloride SA (KLOR-CON) 20 MEQ tablet Take 2 tablets (40 mEq total) by mouth daily. 60 tablet 0   prochlorperazine (COMPAZINE) 10 MG tablet Take 1 tablet (10 mg total) by mouth every 6 (six)  hours as needed. 30 tablet 2   rivaroxaban (XARELTO) 20 MG TABS tablet Take 1 tablet (20 mg total) by mouth daily with supper. 90 tablet 3   No current facility-administered medications for this encounter.    REVIEW OF SYSTEMS:  Notable for that above.   PHYSICAL EXAM:  height is 5' 11.5" (1.816 m) and weight is 206 lb 12.8 oz (93.8 kg). His temperature is 97.7 F (36.5 C). His blood pressure is 96/61 and his pulse is 60. His respiration is 20 and oxygen saturation is 99%.   General: Alert and oriented, in no acute distress  HEENT: Head is normocephalic. Extraocular movements are intact.   Musculoskeletal: He presents in a wheelchair.  He has tenderness palpation in the lower sternal area as well as the lower thoracic and upper lumbar region and the left scapula and left lateral posterior rib cage adjacent to the scapula.  I reviewed the patient's imaging and determined where his pain corresponds with bony lesions on his CTs Neurologic:  No obvious focalities. Speech is fluent. Coordination is intact. Psychiatric: Judgment and insight are intact. Affect is appropriate.   ECOG = 2  0 - Asymptomatic (Fully active, able to carry on all predisease activities without restriction)  1 -  Symptomatic but completely ambulatory (Restricted in physically strenuous activity but ambulatory and able to carry out work of a light or sedentary nature. For example, light housework, office work)  2 - Symptomatic, <50% in bed during the day (Ambulatory and capable of all self care but unable to carry out any work activities. Up and about more than 50% of waking hours)  3 - Symptomatic, >50% in bed, but not bedbound (Capable of only limited self-care, confined to bed or chair 50% or more of waking hours)  4 - Bedbound (Completely disabled. Cannot carry on any self-care. Totally confined to bed or chair)  5 - Death   Santiago Glad MM, Creech RH, Tormey DC, et al. [phone]). "Toxicity and response criteria of the [facility]". Am. Evlyn Clines. Oncol. 5 (6): 649-55   LABORATORY DATA:  Lab Results  Component Value Date   WBC 23.0 (H) 02/18/2021   HGB 10.6 (L) 02/18/2021   HCT 31.9 (L) 02/18/2021   MCV 83.9 02/18/2021   PLT 287 02/18/2021   CMP     Component Value Date/Time   NA 133 (L) 02/18/2021 1415   K 4.5 02/18/2021 1415   CL 98 02/18/2021 1415   CO2 29 02/18/2021 1415   GLUCOSE 112 (H) 02/18/2021 1415   BUN 14 02/18/2021 1415   CREATININE 0.85 02/18/2021 1415   CALCIUM 8.8 (L) 02/18/2021 1415   PROT 7.1 02/18/2021 1415   ALBUMIN 2.5 (L) 02/18/2021 1415   AST 22 02/18/2021 1415   ALT 22 02/18/2021 1415   ALKPHOS 186 (H) 02/18/2021 1415   BILITOT 1.0 02/18/2021 1415   GFRNONAA >60 02/18/2021 1415         RADIOGRAPHY: DG Lumbar Spine Complete  Result Date: 02/08/2021 CLINICAL DATA:  Low back pain extending into the left flank for 3 days appeared EXAM: LUMBAR SPINE - COMPLETE 4+ VIEW COMPARISON:  None. FINDINGS: Alignment is within normal limits. Moderate disc height loss at L5-S1. Mild disc height loss at L1-L2 and L2-L3. advanced facet degenerative changes seen throughout, greatest at L4-L5. IMPRESSION: No acute abnormality of the lumbar spine. Multilevel degenerative changes as above. Electronically Signed   By: Acquanetta Belling M.D.   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 CONTRAST:  OMNIPAQUE IOHEXOL 350 MG/ML SOLN COMPARISON:  08/15/2020 CT FINDINGS: Pharynx and larynx: Status post resection and treatment of a previously noted enhancing mass arising from the buccal surface of the right  lower lip, with focal fat, likely related to reconstruction. No definite residual enhancing mass is seen. Salivary glands: No inflammation, mass, or stone. Thyroid: Unchanged mild enlargement of the thyroid. A previously noted central hypodense lesion is not appreciated on the current exam. Lymph nodes: Status post bilateral neck dissection. No cervical lymphadenopathy. For mediastinal lymph nodes, please see same-day CT chest Vascular: Negative. Limited intracranial: Negative. Visualized orbits: Negative. Mastoids and visualized paranasal sinuses: Mucosal thickening in the right maxillary sinus. Otherwise negative. Skeleton: No acute osseous abnormality in the cervical spine. Status post partial resection of the mandible Upper chest: Please see same-day CT chest Other: Skin thickening overlying the neck, likely sequela of prior radiation. IMPRESSION: 1. Status post resection  of and radiation therapy to an enhancing mass arising from the buccal surface of the right lower lip, with no residual enhancing mass seen. Reconstructive changes. No evidence of local recurrence. 2. Status post bilateral neck dissection. No cervical lymphadenopathy. 3. For findings in the chest, including mediastinal lymph nodes, please see same-day CT chest. Electronically Signed   By: Wiliam Ke M.D.   On: 02/09/2021 20:06   CT CHEST W CONTRAST  Result Date: 02/09/2021 CLINICAL DATA:  Cancer of unknown primary, staging; Metastatic disease evaluation EXAM: CT CHEST, ABDOMEN, AND PELVIS WITH CONTRAST TECHNIQUE: Multidetector CT imaging of the chest, abdomen and pelvis was performed following the standard protocol during bolus administration of intravenous contrast. CONTRAST:  OMNIPAQUE IOHEXOL 350 MG/ML SOLN COMPARISON:  February 08, 2021 FINDINGS: CT CHEST FINDINGS Cardiovascular: Heart is enlarged. Three-vessel coronary artery atherosclerotic calcifications. No pericardial effusion. Atherosclerotic calcifications of the aorta.  Mediastinum/Nodes: There is irregular low-density lymph nodes within the AP window with representative conglomeration of lymph nodes measuring 21 mm (series 3, image 23). Irregular low-density LEFT inferior hilar lymph node versus soft tissue mass measure 16 mm in the short axis (series 3, image 30). Prominent RIGHT paratracheal lymph node measures 10 mm in the short axis (series 3, image 15). Coarsely calcified RIGHT mediastinal lymph nodes.There is a low-density RIGHT hilar lymph node which measures 12 mm in the short axis (series 3, image 30). No axillary adenopathy. Lungs/Pleura: Along the LEFT lower lobe hilum, there is low-density soft tissue which demonstrates mild mass effect on the adjacent bronchus. It tracks along the LEFT main pulmonary artery and spans approximately 32 by 17 mm in representative cross-sectional area (series 6, image 299). There is irregularity of the LEFT bronchus at the bifurcation in the region of this mass, concerning for bronchial wall invasion. No pleural effusion or pneumothorax. Bibasilar enhancing consolidative opacities most consistent with atelectasis. In the peripheral RIGHT upper lobe, there is a wedge-shaped opacity with scattered internal lucency which measures 22 mm (series 5, image 32). No large central pulmonary embolism. There is a rounded pulmonary nodule of the LEFT upper lobe which measures 8 x 6 mm (series 5, image 61). Musculoskeletal: There is an oval nodule in the upper inner RIGHT breast which measures 9 mm (series 3, image 30). There is osseous destruction of the sternum with adjacent soft tissue mass most consistent with osseous metastatic disease. There is osseous destruction of the RIGHT posterolateral sixth rib with a pathologic fracture and associated soft tissue mass. Osseous destruction of the posterolateral LEFT eighth and eleventh there is a lytic lesion of T12 which extends into the RIGHT posterior element rib. Osseous destruction with associated  soft tissue mass at the LEFT scapula. CT ABDOMEN PELVIS FINDINGS Hepatobiliary: There are innumerable hypodense masses throughout the liver. Many of these masses demonstrate fluid attenuation and likely reflect hepatic cysts. However several are indeterminate and may reflect metastatic disease. Representative indeterminate mass in the RIGHT liver measures 22 mm (series 3, image 71). Mass in the LEFT liver is irregular in appearance and is indeterminate; it spans 38 mm (series 3, image 50). Portal vein is patent. Gallbladder is distended with likely cholelithiasis. Pancreas: Unremarkable. No pancreatic ductal dilatation or surrounding inflammatory changes. Spleen: There are multiple hypodense masses in the spleen, nonspecific. Multiple punctate calcifications throughout the spleen consistent with sequela of prior granulomatous infection. Adrenals/Urinary Tract: There is an indeterminate 24 mm RIGHT adrenal nodule. The kidneys enhance symmetrically. No hydronephrosis. No obstructive nephrolithiasis. Bladder is circumferentially thick-walled,  most consistent with sequela of chronic outlet obstruction. Stomach/Bowel: No evidence of bowel obstruction. Appendix is normal. Mild wall thickening of the terminal ileum. Vascular/Lymphatic: Atherosclerotic calcifications. No suspicious lymphadenopathy visualized within the pelvis or retroperitoneum. Reproductive: Prostate is present. Other: Trace free fluid in the pelvis. Musculoskeletal: Lytic lesions involving L1, L3, the LEFT iliac crest, LEFT iliac adjacent to the SI joint, LEFT sacrum, and RIGHT iliac bone. IMPRESSION: 1. There is an irregular masslike opacity along the LEFT lower lobe hilar border with mild mass effect and apparent invasion of the bronchus at this site. Findings are concerning for primary lung malignancy. 2. Irregular low-density mediastinal and bilateral hilar adenopathy are concerning for metastatic nodal disease. 3. Innumerable hypodense masses  throughout the liver, many of which are cysts but several which are indeterminate and may reflect hepatic metastatic disease. These could be further characterized with dedicated MRI liver with contrast when and if clinically appropriate. 4. Indeterminate RIGHT adrenal nodule, suspicious for metastatic disease. This could be further assessed with MRI if clinically indicated. 5. There is osseous metastatic disease involving multiple vertebral bodies in the thoracic and lumbar spine, bilateral ribs, the LEFT scapula, the sternum and the LEFT greater than RIGHT pelvis. 6. Wedge shape opacity of the RIGHT upper lobe with suggestion of internal lucency. Findings could be infectious or inflammatory in etiology. Given wedge-shaped appearance, differential considerations include pulmonary infarction. No central pulmonary embolism within the limitations of this exam. 7. Additional pulmonary nodule within the LEFT upper lobe is indeterminate and may reflect additional site of disease. 8. Nonspecific oval mass in the RIGHT breast. This could be further assessed with dedicated diagnostic mammogram and ultrasound if deemed clinically necessary. 9. Mild wall thickening of the terminal ileum, nonspecific. 10. Scattered hypodense masses throughout the spleen, nonspecific. Aortic Atherosclerosis (ICD10-I70.0). Electronically Signed   By: Meda Klinefelter M.D.   On: 02/09/2021 20:09   MR BRAIN W WO CONTRAST  Result Date: 02/11/2021 CLINICAL DATA:  Non-small cell lung cancer, staging EXAM: MRI HEAD WITHOUT AND WITH CONTRAST TECHNIQUE: Multiplanar, multiecho pulse sequences of the brain and surrounding structures were obtained without and with intravenous contrast. CONTRAST:  10mL GADAVIST GADOBUTROL 1 MMOL/ML IV SOLN COMPARISON:  None. FINDINGS: Brain: Encephalomalacia in the right occipital lobe and right para hippocampal region with surrounding gliosis, compatible with remote right PCA territory infarct. Additional mild  scattered T2 hyperintensities in the white matter, nonspecific but compatible with chronic microvascular ischemic disease. Mild for age generalized atrophy with ex vacuo ventricular dilation. No evidence of acute infarct, hydrocephalus, acute hemorrhage, mass lesion, midline shift, or extra-axial fluid collection. No abnormal enhancement. Vascular: Major arterial flow voids are maintained at the skull base. Small/non dominant right vertebral artery, which appears patent on post-contrast imaging. Skull and upper cervical spine: Normal marrow signal. Sinuses/Orbits: Mild paranasal sinus mucosal thickening. Unremarkable orbits. Other: No sizable mastoid effusions. IMPRESSION: 1. No evidence of acute intracranial abnormality or metastatic disease. 2. Remote right PCA territory infarct. 3. Mild for age chronic microvascular ischemic disease and atrophy. Electronically Signed   By: Feliberto Harts M.D.   On: 02/11/2021 19:43   MR LUMBAR SPINE WO CONTRAST  Result Date: 02/08/2021 CLINICAL DATA:  Low back pain, cancer suspected; technologist note states low back pain and bilateral leg weakness EXAM: MRI LUMBAR SPINE WITHOUT CONTRAST TECHNIQUE: Multiplanar, multisequence MR imaging of the lumbar spine was performed. No intravenous contrast was administered. COMPARISON:  None. FINDINGS: Segmentation:  Standard. Alignment:  No significant listhesis. Vertebrae: Vertebral body heights  are maintained. There is patchy STIR hyperintensity at T12 and L1 including involvement of the right T12 pedicle with there may be some extraosseous extension. Patchy STIR hyperintensity at L3 eccentric to the right. Partially imaged possible STIR hyperintensity within the right sacrum. Overall decreased T1 marrow signal. Conus medullaris and cauda equina: Conus extends to the T12-L1 level. Conus and cauda equina appear normal. Paraspinal and other soft tissues: Unremarkable. Disc levels: L1-L2: Disc bulge eccentric to the right. No canal or  foraminal stenosis. L2-L3: Disc bulge with endplate osteophytes. Mild facet arthropathy with ligamentum flavum infolding. No canal or foraminal stenosis. L3-L4: Disc bulge with endplate osteophytes. Mild facet arthropathy with ligamentum flavum infolding. No canal or foraminal stenosis. L4-L5: Disc bulge with endplate osteophytic ridging. Marked facet arthropathy with ligamentum flavum infolding. Minor canal stenosis. Effacement of subarticular recesses. Mild right foraminal stenosis with facet spurring contacting the exiting nerve root. Mild left foraminal stenosis. L5-S1: Marked disc height loss with probable partial bridging. Disc bulge with endplate osteophytic ridging. Moderate facet arthropathy. No canal stenosis. Minor foraminal stenosis. IMPRESSION: Overall abnormal marrow signal with areas of more discrete abnormality. Suspect metastatic disease. Multilevel degenerative changes without high-grade stenosis. Electronically Signed   By: Guadlupe Spanish M.D.   On: 02/08/2021 12:38   CT ABDOMEN PELVIS W CONTRAST  Result Date: 02/09/2021 CLINICAL DATA:  Cancer of unknown primary, staging; Metastatic disease evaluation EXAM: CT CHEST, ABDOMEN, AND PELVIS WITH CONTRAST TECHNIQUE: Multidetector CT imaging of the chest, abdomen and pelvis was performed following the standard protocol during bolus administration of intravenous contrast. CONTRAST:  OMNIPAQUE IOHEXOL 350 MG/ML SOLN COMPARISON:  February 08, 2021 FINDINGS: CT CHEST FINDINGS Cardiovascular: Heart is enlarged. Three-vessel coronary artery atherosclerotic calcifications. No pericardial effusion. Atherosclerotic calcifications of the aorta. Mediastinum/Nodes: There is irregular low-density lymph nodes within the AP window with representative conglomeration of lymph nodes measuring 21 mm (series 3, image 23). Irregular low-density LEFT inferior hilar lymph node versus soft tissue mass measure 16 mm in the short axis (series 3, image 30). Prominent  RIGHT paratracheal lymph node measures 10 mm in the short axis (series 3, image 15). Coarsely calcified RIGHT mediastinal lymph nodes.There is a low-density RIGHT hilar lymph node which measures 12 mm in the short axis (series 3, image 30). No axillary adenopathy. Lungs/Pleura: Along the LEFT lower lobe hilum, there is low-density soft tissue which demonstrates mild mass effect on the adjacent bronchus. It tracks along the LEFT main pulmonary artery and spans approximately 32 by 17 mm in representative cross-sectional area (series 6, image 299). There is irregularity of the LEFT bronchus at the bifurcation in the region of this mass, concerning for bronchial wall invasion. No pleural effusion or pneumothorax. Bibasilar enhancing consolidative opacities most consistent with atelectasis. In the peripheral RIGHT upper lobe, there is a wedge-shaped opacity with scattered internal lucency which measures 22 mm (series 5, image 32). No large central pulmonary embolism. There is a rounded pulmonary nodule of the LEFT upper lobe which measures 8 x 6 mm (series 5, image 61). Musculoskeletal: There is an oval nodule in the upper inner RIGHT breast which measures 9 mm (series 3, image 30). There is osseous destruction of the sternum with adjacent soft tissue mass most consistent with osseous metastatic disease. There is osseous destruction of the RIGHT posterolateral sixth rib with a pathologic fracture and associated soft tissue mass. Osseous destruction of the posterolateral LEFT eighth and eleventh there is a lytic lesion of T12 which extends into the RIGHT posterior  element rib. Osseous destruction with associated soft tissue mass at the LEFT scapula. CT ABDOMEN PELVIS FINDINGS Hepatobiliary: There are innumerable hypodense masses throughout the liver. Many of these masses demonstrate fluid attenuation and likely reflect hepatic cysts. However several are indeterminate and may reflect metastatic disease. Representative  indeterminate mass in the RIGHT liver measures 22 mm (series 3, image 71). Mass in the LEFT liver is irregular in appearance and is indeterminate; it spans 38 mm (series 3, image 50). Portal vein is patent. Gallbladder is distended with likely cholelithiasis. Pancreas: Unremarkable. No pancreatic ductal dilatation or surrounding inflammatory changes. Spleen: There are multiple hypodense masses in the spleen, nonspecific. Multiple punctate calcifications throughout the spleen consistent with sequela of prior granulomatous infection. Adrenals/Urinary Tract: There is an indeterminate 24 mm RIGHT adrenal nodule. The kidneys enhance symmetrically. No hydronephrosis. No obstructive nephrolithiasis. Bladder is circumferentially thick-walled, most consistent with sequela of chronic outlet obstruction. Stomach/Bowel: No evidence of bowel obstruction. Appendix is normal. Mild wall thickening of the terminal ileum. Vascular/Lymphatic: Atherosclerotic calcifications. No suspicious lymphadenopathy visualized within the pelvis or retroperitoneum. Reproductive: Prostate is present. Other: Trace free fluid in the pelvis. Musculoskeletal: Lytic lesions involving L1, L3, the LEFT iliac crest, LEFT iliac adjacent to the SI joint, LEFT sacrum, and RIGHT iliac bone. IMPRESSION: 1. There is an irregular masslike opacity along the LEFT lower lobe hilar border with mild mass effect and apparent invasion of the bronchus at this site. Findings are concerning for primary lung malignancy. 2. Irregular low-density mediastinal and bilateral hilar adenopathy are concerning for metastatic nodal disease. 3. Innumerable hypodense masses throughout the liver, many of which are cysts but several which are indeterminate and may reflect hepatic metastatic disease. These could be further characterized with dedicated MRI liver with contrast when and if clinically appropriate. 4. Indeterminate RIGHT adrenal nodule, suspicious for metastatic disease. This  could be further assessed with MRI if clinically indicated. 5. There is osseous metastatic disease involving multiple vertebral bodies in the thoracic and lumbar spine, bilateral ribs, the LEFT scapula, the sternum and the LEFT greater than RIGHT pelvis. 6. Wedge shape opacity of the RIGHT upper lobe with suggestion of internal lucency. Findings could be infectious or inflammatory in etiology. Given wedge-shaped appearance, differential considerations include pulmonary infarction. No central pulmonary embolism within the limitations of this exam. 7. Additional pulmonary nodule within the LEFT upper lobe is indeterminate and may reflect additional site of disease. 8. Nonspecific oval mass in the RIGHT breast. This could be further assessed with dedicated diagnostic mammogram and ultrasound if deemed clinically necessary. 9. Mild wall thickening of the terminal ileum, nonspecific. 10. Scattered hypodense masses throughout the spleen, nonspecific. Aortic Atherosclerosis (ICD10-I70.0). Electronically Signed   By: Meda Klinefelter M.D.   On: 02/09/2021 20:09   MR LIVER W WO CONTRAST  Result Date: 02/11/2021 EXAM: MRI ABDOMEN WITHOUT AND WITH CONTRAST TECHNIQUE: Multiplanar multisequence MR imaging of the abdomen was performed both before and after the administration of intravenous contrast. CONTRAST:  10mL GADAVIST GADOBUTROL 1 MMOL/ML IV SOLN COMPARISON:  None. FINDINGS: Lower chest:  Lung bases are clear. Hepatobiliary: Multiple round cystic lesions LEFT hepatic lobe. These are complex cystic lesions with small peripheral crypts bordering the cystic complexes. These are hyperintense on T2 weighted imaging and low signal intensity on T1 weighted imaging. Lesions have thin peripheral enhancement. For example lesion in the RIGHT hepatic lobe measuring 4.2 by 3.2 cm (image 17/series 17). There is a conglomerate of cystic enhancing lesions in the lateral segment LEFT hepatic  lobe measuring 5.9 x 4.3 cm with SUV max  on image 18/21. Similar cystic lesion with multiple small satellite cysts in the posterior RIGHT hepatic lobe measuring 3 cm on image 16/21. Pancreas: Normal pancreatic parenchymal intensity. No ductal dilatation or inflammation. Spleen: Similar cystic lesion with peripheral enhancement in the spleen measuring 18 mm on image 35/series 21. Adrenals/urinary tract: Peripheral enhancing lesion of the adrenal gland measuring 2.4 cm (image 38/series 21. Stomach/Bowel: Stomach and limited of the small bowel is unremarkable Vascular/Lymphatic: Abdominal aortic normal caliber. No retroperitoneal periportal lymphadenopathy. Musculoskeletal: There is an enhancing lesion in the L1 vertebral body (image 36/21. Small enhancing lesion in lower lumbar spine on image 73/21. Enlarged lesion of the posterior LEFT rib measuring 18 x 24 mm on image 40 4/21. IMPRESSION: 1. Unusual pattern of peripheral enhancing cystic complexes within LEFT and RIGHT hepatic lobe. 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 2. 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 3. Indeterminate lesion of the RIGHT adrenal gland concern for RIGHT adrenal metastasis. 4. Expansile lesion in the posterior LEFT rib is suggestive of metastatic lesion. Enhancing lesions within the spine lumbar spine are also concerning for metastatic lesions. Electronically Signed   By: Genevive Bi M.D.   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 Please perform CT-guided biopsy expansile lesion involving the lateral aspect of the right sixth rib for tissue diagnostic purposes. EXAM: CT-GUIDED BIOPSY OF EXPANSILE LESION INVOLVING LATERAL ASPECT OF THE RIGHT SIXTH RIB COMPARISON:  CT the chest, abdomen and pelvis-02/09/2021 MEDICATIONS: None.  ANESTHESIA/SEDATION: Moderate (conscious) sedation was employed during this procedure as administered by the Interventional Radiology RN. A total of Versed 0.5 mg and Fentanyl 25 mcg was administered intravenously. Moderate Sedation Time: 12 minutes. The patient's level of consciousness and vital signs were monitored continuously by radiology nursing throughout the procedure under my direct supervision. CONTRAST:  None. COMPLICATIONS: None immediate. PROCEDURE: Informed consent was obtained from the patient following an explanation of the procedure, risks, benefits and alternatives. A time out was performed prior to the initiation of the procedure. The patient was positioned supine, slightly LPO on the CT table and a limited CT was performed for procedural planning demonstrating unchanged size and appearance the approximately 3.6 x 2.1 cm expansile lesion involving the lateral aspect of the right sixth rib (image 24, series 3). The procedure was planned. The operative site was prepped and draped in the usual sterile fashion. Appropriate trajectory was confirmed with a 22 gauge spinal needle after the adjacent tissues were anesthetized with 1% Lidocaine with epinephrine. Under intermittent CT guidance, a 17 gauge coaxial needle was advanced into the peripheral aspect of the mass. Appropriate positioning was confirmed and 6 core needle biopsy samples were obtained with an 18 gauge core needle biopsy device. The co-axial needle was removed and hemostasis was achieved with manual compression. A limited postprocedural CT was negative for pneumothorax, hemorrhage or additional complication. A dressing was applied. The patient tolerated the procedure well without immediate postprocedural complication. IMPRESSION: Technically successful CT guided core needle biopsy of expansile lesion involving the lateral aspect of the right sixth rib. Electronically Signed   By: Simonne Come M.D.   On: 02/12/2021 17:33       IMPRESSION/PLAN: This is a very pleasant 80 year old gentleman that I know well, with a history of locally  advanced oral cavity cancer.  He was going to follow-up with me next month with restaging scans but unfortunately he presented prematurely to the hospital with back pain and restaging imaging shows disseminated metastases in the bones as well as the liver.  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  Biopsy results are pending.  Given his severe pain I recommend that we proceed with radiation as soon as possible to start palliating him symptoms.  I recommend that we treat the lower thoracic and upper lumbar metastases as well as the left scapular and left rib cage metastases in the left sternal metastasis.  Anticipate 2 weeks of treatment, 10 fractions, with palliative intent.  He is enthusiastic about proceeding.  We discussed the risks benefits and side effects of this treatment which may include but not necessarily be limited to fatigue, skin irritation, gastrointestinal upset or injury, rare internal organ injury to other organs adjacent to the metastatic sites.  Consent has been signed and placed in his chart.  We will plan his treatment today and start it tomorrow.   On date of service, in total, I spent 45 minutes on this encounter. Patient was seen in person.  __________________________________________   Lonie Peak, MD  This document serves as a record of services personally performed by Lonie Peak, MD. It was created on her behalf by Neena Rhymes, a trained medical scribe. The creation of this record is based on the scribe's personal observations and the provider's statements to them. This document has been checked and approved by the attending provider.

## 2021-02-13 NOTE — Progress Notes (Signed)
Oncology Nurse Navigator Documentation   I met with Robert [name] before and during his appointment with Robert [name] today. Robert [name] is well known to me because of his recent treatment for head and neck cancer. He has unfortunately he has developed metastatic cancer and is being evaluated for spinal/bone radiation today. I offered emotional support to Robert [name] and answered his questions to the best of my ability. After his discussion with Robert [name] I escorted him to [place] for his radiation planning. Robert [name] knows to call me for any needs/questions he may have in the upcoming days.   Harlow Asa RN, BSN, OCN Head & Neck Oncology Nurse [place] at [facility] Phone # [phone]  Fax # [phone]

## 2021-02-13 NOTE — Progress Notes (Signed)
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 Indeterminate lesion of the RIGHT adrenal gland concern for RIGHT adrenal metastasis. Expansile lesion in the posterior LEFT rib is suggestive of metastatic lesion. Enhancing lesions within the spine lumbar spine are also concerning for metastatic lesions.  CT Chest/Abd/Pelvis w/ Contrast 02/09/2021 --IMPRESSION: There is an irregular masslike opacity along the LEFT lower lobe hilar border with mild mass effect and apparent invasion of the bronchus at this site. Findings are concerning for primary lung malignancy. Irregular low-density mediastinal and bilateral hilar adenopathy are concerning for metastatic nodal disease. Innumerable hypodense masses throughout the liver, many of which are cysts but several which are indeterminate and may reflect hepatic metastatic disease. These could be further characterized with dedicated MRI liver with contrast when and if clinically appropriate. Indeterminate RIGHT adrenal nodule, suspicious for metastatic disease. This could be further assessed with MRI if clinically indicated. There is osseous metastatic disease involving multiple vertebral bodies in the thoracic and lumbar spine, bilateral ribs, the LEFT scapula, the sternum and the LEFT greater than RIGHT pelvis. Wedge shape opacity of the RIGHT upper lobe with suggestion of internal lucency. Findings could be infectious or inflammatory in etiology. Given wedge-shaped appearance, differential considerations include  pulmonary infarction. No central pulmonary embolism within the limitations of this exam. Additional pulmonary nodule within the LEFT upper lobe is indeterminate and may reflect additional site of disease. Nonspecific oval mass in the RIGHT breast. This could be further assessed with dedicated diagnostic mammogram and ultrasound if deemed clinically necessary. Mild wall thickening of the terminal ileum, nonspecific. Scattered hypodense masses throughout the spleen, nonspecific.  MRI Lumbar Spine 02/08/2021 --IMPRESSION: Overall abnormal marrow signal with areas of more discrete abnormality. Suspect metastatic disease. Multilevel degenerative changes without high-grade stenosis  Location(s) of Symptomatic Metastases: Mid and lower back  Past/Anticipated chemotherapy by medical oncology, if any:  Seen by Dr. Curt Bears (while hospitalized) 02/10/2021 I recommended for the patient to have MRI of the liver for further identification of the suspicious liver lesion and also to choose the right lesion for ultrasound-guided core biopsy for confirmation of tissue diagnosis.  If the lesion in the liver are consistent with cyst, I would refer the patient to pulmonary medicine for consideration of bronchoscopy with endobronchial ultrasound and biopsies for tissue diagnosis. I will arrange for the patient to have a PET scan on outpatient basis after discharge to complete the staging work-up.  I will also order MRI of the brain to rule out brain metastasis. Once the tissue diagnosis is confirmed, I will have a discussion with the patient regarding his treatment options.  I briefly discussed with him the option of palliative care and hospice referral versus consideration of palliative systemic chemotherapy plus immunotherapy if needed in the future.  The patient is interested in treatment if needed. I will arrange a follow-up appointment for the patient with me after discharge for more detailed discussion  of his treatment options in the absence of Dr. Chryl Heck who is currently on maternity leave. For the back pain, the patient may benefit from palliative radiotherapy to the painful metastatic lesions.  Pain on a  scale of 0-10 is: Reports back pain tends to be worse first thing in the morning, reports it ranges from 7-9 out 10. Since being discharged from the hospital is managing pain with OTC Tylenol and Icy/Hot patches  If Spine Met(s), symptoms, if any, include: Bowel/Bladder retention or incontinence (please describe): Reports he struggles with constipation and urinary urgency, but denies any other bowel or bladder concerns Numbness or weakness in extremities (please describe): Reports generalized weakness and some difficulty with fine motor skills (such as buttoning a shirt) Current Decadron regimen, if applicable: Not currently prescribed  Ambulatory status? Walker? Wheelchair?: Presents to clinic in wheelchair. Reports he has to use a walker to when ambulating around his house. States most difficult task is standing, but once he's upright he can move around with the support of his walker  SAFETY ISSUES: Prior radiation? Yes: 10/15/2020 through 11/29/2020 Site Technique Total Dose (Gy) Dose per Fx (Gy) Completed Fx Beam Energies  Mandible: HN_low_gum IMRT 66/66 2 33/33 6X   Pacemaker/ICD? No Possible current pregnancy? N/A Is the patient on methotrexate? No  Current Complaints / other details:  Waiting on call for PET scan appointment. F/U with [facility] scheduled for 02/18/21

## 2021-02-14 ENCOUNTER — Ambulatory Visit: Payer: Medicare Other | Admitting: Radiation Oncology

## 2021-02-14 ENCOUNTER — Ambulatory Visit: Payer: Medicare Other

## 2021-02-14 ENCOUNTER — Ambulatory Visit
Admission: RE | Admit: 2021-02-14 | Discharge: 2021-02-14 | Disposition: A | Discharge status: Home / Self Care | Payer: Medicare Other | Source: Ambulatory Visit | Attending: Radiation Oncology | Admitting: Radiation Oncology

## 2021-02-14 DIAGNOSIS — C7951 Secondary malignant neoplasm of bone: Secondary | ICD-10-CM | POA: Diagnosis not present

## 2021-02-14 DIAGNOSIS — R911 Solitary pulmonary nodule: Secondary | ICD-10-CM | POA: Diagnosis not present

## 2021-02-14 DIAGNOSIS — C801 Malignant (primary) neoplasm, unspecified: Secondary | ICD-10-CM | POA: Diagnosis not present

## 2021-02-14 DIAGNOSIS — C031 Malignant neoplasm of lower gum: Secondary | ICD-10-CM | POA: Diagnosis not present

## 2021-02-14 DIAGNOSIS — E279 Disorder of adrenal gland, unspecified: Secondary | ICD-10-CM | POA: Diagnosis not present

## 2021-02-14 DIAGNOSIS — Z510 Encounter for antineoplastic radiation therapy: Secondary | ICD-10-CM | POA: Diagnosis not present

## 2021-02-14 NOTE — Progress Notes (Signed)
Oncology Nurse Navigator Documentation   To provide support, encouragement and care continuity, met with Mr. Robert [name] for his initial RT for his metastatic disease. . Mr. Robert [name] completed treatment without difficulty, denied questions/concerns. I contacted Dr. Benson Norway about patients concern about a bone or tooth in his mouth that is very sharp and causing pain when he eats at times. I reviewed the registration/arrival procedure for subsequent treatments. I encouraged them to call me with questions/concerns as tmts proceed.   Harlow Asa RN, BSN, OCN Head & Neck Oncology Nurse [place] at [facility] Phone # [phone]  Fax # [phone]

## 2021-02-15 ENCOUNTER — Ambulatory Visit: Payer: Medicare Other

## 2021-02-15 ENCOUNTER — Other Ambulatory Visit: Payer: Self-pay

## 2021-02-15 ENCOUNTER — Ambulatory Visit
Admission: RE | Admit: 2021-02-15 | Discharge: 2021-02-15 | Disposition: A | Discharge status: Home / Self Care | Payer: Medicare Other | Source: Ambulatory Visit | Attending: Radiation Oncology | Admitting: Radiation Oncology

## 2021-02-15 DIAGNOSIS — C7951 Secondary malignant neoplasm of bone: Secondary | ICD-10-CM | POA: Diagnosis not present

## 2021-02-15 DIAGNOSIS — Z510 Encounter for antineoplastic radiation therapy: Secondary | ICD-10-CM | POA: Diagnosis not present

## 2021-02-15 DIAGNOSIS — E279 Disorder of adrenal gland, unspecified: Secondary | ICD-10-CM | POA: Diagnosis not present

## 2021-02-15 DIAGNOSIS — R911 Solitary pulmonary nodule: Secondary | ICD-10-CM | POA: Diagnosis not present

## 2021-02-15 DIAGNOSIS — C031 Malignant neoplasm of lower gum: Secondary | ICD-10-CM | POA: Diagnosis not present

## 2021-02-15 DIAGNOSIS — C801 Malignant (primary) neoplasm, unspecified: Secondary | ICD-10-CM | POA: Diagnosis not present

## 2021-02-15 LAB — SURGICAL PATHOLOGY

## 2021-02-15 NOTE — Progress Notes (Signed)
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 He had metastatic disease in October 2022 with metastatic disease involving the lung as well as mediastinal lymphadenopathy and multiple suspicious liver lesion as well as multiple osseous metastasis and right adrenal suspicious metastatic disease.  PDL1 Expression: pending.   PRIOR THERAPY:  1) surgical resection in October 2019 under the care of Dr. Vicie Mutters for the left buccal squamous cel carcinoma and reconstructed local mucosal flaps. 2) May 2022 status post surgical resection with bilateral neck dissection as well as adjuvant radiotherapy under the care of Dr. Isidore Moos.   CURRENT THERAPY:  1) Palliative systemic chemotherapy carboplatin AUC of 5, paclitaxel 175 mg per metered square, Keytruda 200 mg IV every 3 weeks with Neulasta support.  First dose expected on 02/25/2021. 2) palliative radiation under the care of Dr. Isidore Moos to the spine, scapula, and ribs.  Last treatment expected on 02/27/21.   INTERVAL HISTORY: Robert [name] 80 y.o. male returns to the clinic today for a follow-up visit.  The patient was recently admitted to the hospital after presenting for the chief complaint of low back pain and weakness in the lower extremity. Prior to this hospitalization, he was active and playing golf frequently. He had full CT imaging of the neck, chest, abdomen, and pelvis which showed an irregular masslike opacity along the left lower lobe hilar border with mild mass affect and invasion of the bronchus.  There is also mediastinal and bilateral hilar adenopathy and  innumerable hypodense masses throughout the liver, many of which are cysts, but several are indeterminate may reflect hepatic metastatic disease.  There is an indeterminate right adrenal nodule suspicious for metastatic disease and osseous metastatic disease involving multiple vertebral bodies in the thoracic and lumbar spine.  Oncology was consulted recommended biopsy and restaging brain MRI. His brain MRI was negative for metastatic disease. He had a CT guided biopsy of the expansile right 6th rib lesion. The final pathology of the came back with recurrent poorly differentiated squamous cell carcinoma.  Overall, the patient is fair. He saw radiation oncology and Dr. Isidore Moos earlier today. He is receiving palliative radiation to the left scapula, spine, and ribs.  For pain, he had been taking children's Tylenol since he has a challenging time swallowing pills.  Starting today, he discussed with Dr. Isidore Moos how to take his liquid Tylenol and he is going to try that to see if it helps with pain control.  For pain he generally rates his worst pain a 5-6 out of 10.  The Tylenol and IcyHot patches take his pain from a 5-6 out of 10 to a 3 out of 10.   He states his breathing is "decent".  He denies any significant cough and states that he coughs 3-4 times per day and coughs up phlegm.  He does not need to take anything for cough.  He also reports that he denies overt shortness of breath but states that sometimes his breath seems and "weaker".  Denies any fever, chills, or night sweats.  He lost about 20 pounds since he was seen in August 2022.  Denies any nausea, vomiting, or diarrhea.  He sometimes has constipation  which he is going to try taking half of MiraLAX.  Denies any headache or visual changes.  He is here today for evaluation and repeat blood work and for more detailed discussion about his current condition and recommended treatment options.     MEDICAL HISTORY: Past Medical History:  Diagnosis Date    A-fib ([place])    HTN (hypertension)     ALLERGIES:  has No Known Allergies.  MEDICATIONS:  Current Outpatient Medications  Medication Sig Dispense Refill   prochlorperazine (COMPAZINE) 10 MG tablet Take 1 tablet (10 mg total) by mouth every 6 (six) hours as needed. 30 tablet 2   acetaminophen (TYLENOL) 160 MG/5ML suspension Take 30 mLs (960 mg total) by mouth every 6 (six) hours as needed for moderate pain. 1000 mL 0   docusate sodium (COLACE) 100 MG capsule Take 100 mg by mouth 2 (two) times daily as needed for mild constipation.     furosemide (LASIX) 20 MG tablet Take 1 tablet (20 mg total) by mouth daily. 30 tablet 0   metoprolol tartrate (LOPRESSOR) 100 MG tablet Take 100 mg by mouth 2 (two) times daily.     potassium chloride SA (KLOR-CON) 20 MEQ tablet Take 2 tablets (40 mEq total) by mouth daily. 60 tablet 0   rivaroxaban (XARELTO) 20 MG TABS tablet Take 1 tablet (20 mg total) by mouth daily with supper. 90 tablet 3   No current facility-administered medications for this visit.    SURGICAL HISTORY:  Past Surgical History:  Procedure Laterality Date   DENTAL SURGERY      REVIEW OF SYSTEMS:   Review of Systems  Constitutional: Positive for weight loss and fatigue.  Negative for appetite change, chills, and fever. HENT: Positive for a difficult time swallowing large pills.  Negative for mouth sores, nosebleeds, sore throat and trouble swallowing.   Eyes: Negative for eye problems and icterus.  Respiratory: Negative for any significant cough cough, hemoptysis, shortness of breath and wheezing.   Cardiovascular: Negative for chest pain and leg swelling.  Gastrointestinal: Positive for occasional constipation.  Negative for abdominal pain, diarrhea, nausea and vomiting.  Genitourinary: Negative for bladder incontinence, difficulty urinating, dysuria, frequency and hematuria.   Musculoskeletal: Positive for back pain and shoulder pain.  Negative for gait problem, neck pain and  neck stiffness.  Skin: Negative for itching and rash.  Well-healed wound on right forearm from prior surgery. Neurological: Negative for dizziness, extremity weakness, gait problem, headaches, light-headedness and seizures.  Hematological: Negative for adenopathy. Does not bruise/bleed easily.  Psychiatric/Behavioral: Negative for confusion, depression and sleep disturbance. The patient is not nervous/anxious.     PHYSICAL EXAMINATION:  Blood pressure (!) 105/58, pulse 72, temperature (!) 97.5 F (36.4 C), temperature source Temporal, resp. rate 18, weight 204 lb 2 oz (92.6 kg), SpO2 100 %.  ECOG PERFORMANCE STATUS: 2  Physical Exam  Constitutional: Oriented to person, place, and time and well-developed, well-nourished, and in no distress.  HENT:  Head: Normocephalic and atraumatic.  Mouth/Throat: Oropharynx is clear and moist. No oropharyngeal exudate. Prior neck and oral surgery scaring.  Eyes: Conjunctivae are normal. Right eye exhibits no discharge. Left eye exhibits no discharge. No scleral icterus.  Neck: Normal range of motion. Neck supple.  Cardiovascular: Normal rate, irregular rhythm, normal heart sounds and intact distal pulses.   Pulmonary/Chest: Effort normal and breath sounds normal. No respiratory distress. No wheezes. No rales.  Abdominal: Soft. Bowel sounds are normal. Exhibits no distension and no mass. There is no tenderness.  Musculoskeletal: Normal range of motion. Exhibits no edema.  Lymphadenopathy:    No cervical adenopathy.  Neurological: Alert and oriented to person, place, and time. Exhibits normal muscle tone.  Examined in the wheelchair. Skin: Skin is warm and dry. No rash noted. Not diaphoretic. No erythema. No pallor.  Well-healed scar along right forearm. Psychiatric: Mood, memory and judgment normal.  Vitals reviewed.  LABORATORY DATA: Lab Results  Component Value Date   WBC 23.0 (H) 02/18/2021   HGB 10.6 (L) 02/18/2021   HCT 31.9 (L) 02/18/2021    MCV 83.9 02/18/2021   PLT 287 02/18/2021      Chemistry      Component Value Date/Time   NA 133 (L) 02/18/2021 1415   K 4.5 02/18/2021 1415   CL 98 02/18/2021 1415   CO2 29 02/18/2021 1415   BUN 14 02/18/2021 1415   CREATININE 0.85 02/18/2021 1415      Component Value Date/Time   CALCIUM 8.8 (L) 02/18/2021 1415   ALKPHOS 186 (H) 02/18/2021 1415   AST 22 02/18/2021 1415   ALT 22 02/18/2021 1415   BILITOT 1.0 02/18/2021 1415       RADIOGRAPHIC STUDIES:  DG Lumbar Spine Complete  Result Date: 02/08/2021 CLINICAL DATA:  Low back pain extending into the left flank for 3 days appeared EXAM: LUMBAR SPINE - COMPLETE 4+ VIEW COMPARISON:  None. FINDINGS: Alignment is within normal limits. Moderate disc height loss at L5-S1. Mild disc height loss at L1-L2 and L2-L3. advanced facet degenerative changes seen throughout, greatest at L4-L5. IMPRESSION: No acute abnormality of the lumbar spine. Multilevel degenerative changes as above. Electronically Signed   By: Miachel Roux M.D.   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 CONTRAST:  115m OMNIPAQUE IOHEXOL 350 MG/ML SOLN COMPARISON:  08/15/2020 CT FINDINGS: Pharynx and larynx: Status post resection and treatment of a previously noted enhancing mass arising from the buccal surface of the right lower lip, with focal fat, likely related to reconstruction. No definite residual enhancing mass is seen. Salivary glands: No inflammation, mass, or stone. Thyroid: Unchanged mild enlargement of the thyroid. A previously noted central hypodense lesion is not appreciated on the  current exam. Lymph nodes: Status post bilateral neck dissection. No cervical lymphadenopathy. For mediastinal lymph nodes, please see same-day CT chest Vascular: Negative. Limited intracranial: Negative. Visualized orbits: Negative. Mastoids and visualized paranasal sinuses: Mucosal thickening in the right maxillary sinus. Otherwise negative. Skeleton: No acute osseous abnormality in the cervical spine. Status post partial resection of the mandible Upper chest: Please see same-day CT chest Other: Skin thickening overlying the neck, likely sequela of prior radiation. IMPRESSION: 1. Status post resection of and radiation therapy to an enhancing mass arising from the buccal surface of the right lower lip, with no residual enhancing mass seen. Reconstructive changes. No evidence of local recurrence. 2. Status post bilateral neck dissection. No cervical lymphadenopathy. 3. For findings in the chest, including mediastinal lymph nodes, please see same-day CT chest. Electronically Signed   By: AMerilyn BabaM.D.   On: 02/09/2021 20:06   CT CHEST W CONTRAST  Result Date: 02/09/2021 CLINICAL DATA:  Cancer of unknown primary, staging; Metastatic disease  evaluation EXAM: CT CHEST, ABDOMEN, AND PELVIS WITH CONTRAST TECHNIQUE: Multidetector CT imaging of the chest, abdomen and pelvis was performed following the standard protocol during bolus administration of intravenous contrast. CONTRAST:  138m OMNIPAQUE IOHEXOL 350 MG/ML SOLN COMPARISON:  February 08, 2021 FINDINGS: CT CHEST FINDINGS Cardiovascular: Heart is enlarged. Three-vessel coronary artery atherosclerotic calcifications. No pericardial effusion. Atherosclerotic calcifications of the aorta. Mediastinum/Nodes: There is irregular low-density lymph nodes within the AP window with representative conglomeration of lymph nodes measuring 21 mm (series 3, image 23). Irregular low-density LEFT inferior hilar lymph node versus soft tissue mass measure 16 mm in the short axis  (series 3, image 30). Prominent RIGHT paratracheal lymph node measures 10 mm in the short axis (series 3, image 15). Coarsely calcified RIGHT mediastinal lymph nodes.There is a low-density RIGHT hilar lymph node which measures 12 mm in the short axis (series 3, image 30). No axillary adenopathy. Lungs/Pleura: Along the LEFT lower lobe hilum, there is low-density soft tissue which demonstrates mild mass effect on the adjacent bronchus. It tracks along the LEFT main pulmonary artery and spans approximately 32 by 17 mm in representative cross-sectional area (series 6, image 299). There is irregularity of the LEFT bronchus at the bifurcation in the region of this mass, concerning for bronchial wall invasion. No pleural effusion or pneumothorax. Bibasilar enhancing consolidative opacities most consistent with atelectasis. In the peripheral RIGHT upper lobe, there is a wedge-shaped opacity with scattered internal lucency which measures 22 mm (series 5, image 32). No large central pulmonary embolism. There is a rounded pulmonary nodule of the LEFT upper lobe which measures 8 x 6 mm (series 5, image 61). Musculoskeletal: There is an oval nodule in the upper inner RIGHT breast which measures 9 mm (series 3, image 30). There is osseous destruction of the sternum with adjacent soft tissue mass most consistent with osseous metastatic disease. There is osseous destruction of the RIGHT posterolateral sixth rib with a pathologic fracture and associated soft tissue mass. Osseous destruction of the posterolateral LEFT eighth and eleventh there is a lytic lesion of T12 which extends into the RIGHT posterior element rib. Osseous destruction with associated soft tissue mass at the LEFT scapula. CT ABDOMEN PELVIS FINDINGS Hepatobiliary: There are innumerable hypodense masses throughout the liver. Many of these masses demonstrate fluid attenuation and likely reflect hepatic cysts. However several are indeterminate and may reflect  metastatic disease. Representative indeterminate mass in the RIGHT liver measures 22 mm (series 3, image 71). Mass in the LEFT liver is irregular in appearance and is indeterminate; it spans 38 mm (series 3, image 50). Portal vein is patent. Gallbladder is distended with likely cholelithiasis. Pancreas: Unremarkable. No pancreatic ductal dilatation or surrounding inflammatory changes. Spleen: There are multiple hypodense masses in the spleen, nonspecific. Multiple punctate calcifications throughout the spleen consistent with sequela of prior granulomatous infection. Adrenals/Urinary Tract: There is an indeterminate 24 mm RIGHT adrenal nodule. The kidneys enhance symmetrically. No hydronephrosis. No obstructive nephrolithiasis. Bladder is circumferentially thick-walled, most consistent with sequela of chronic outlet obstruction. Stomach/Bowel: No evidence of bowel obstruction. Appendix is normal. Mild wall thickening of the terminal ileum. Vascular/Lymphatic: Atherosclerotic calcifications. No suspicious lymphadenopathy visualized within the pelvis or retroperitoneum. Reproductive: Prostate is present. Other: Trace free fluid in the pelvis. Musculoskeletal: Lytic lesions involving L1, L3, the LEFT iliac crest, LEFT iliac adjacent to the SI joint, LEFT sacrum, and RIGHT iliac bone. IMPRESSION: 1. There is an irregular masslike opacity along the LEFT lower lobe hilar border with mild mass effect  and apparent invasion of the bronchus at this site. Findings are concerning for primary lung malignancy. 2. Irregular low-density mediastinal and bilateral hilar adenopathy are concerning for metastatic nodal disease. 3. Innumerable hypodense masses throughout the liver, many of which are cysts but several which are indeterminate and may reflect hepatic metastatic disease. These could be further characterized with dedicated MRI liver with contrast when and if clinically appropriate. 4. Indeterminate RIGHT adrenal nodule,  suspicious for metastatic disease. This could be further assessed with MRI if clinically indicated. 5. There is osseous metastatic disease involving multiple vertebral bodies in the thoracic and lumbar spine, bilateral ribs, the LEFT scapula, the sternum and the LEFT greater than RIGHT pelvis. 6. Wedge shape opacity of the RIGHT upper lobe with suggestion of internal lucency. Findings could be infectious or inflammatory in etiology. Given wedge-shaped appearance, differential considerations include pulmonary infarction. No central pulmonary embolism within the limitations of this exam. 7. Additional pulmonary nodule within the LEFT upper lobe is indeterminate and may reflect additional site of disease. 8. Nonspecific oval mass in the RIGHT breast. This could be further assessed with dedicated diagnostic mammogram and ultrasound if deemed clinically necessary. 9. Mild wall thickening of the terminal ileum, nonspecific. 10. Scattered hypodense masses throughout the spleen, nonspecific. Aortic Atherosclerosis (ICD10-I70.0). Electronically Signed   By: Valentino Saxon M.D.   On: 02/09/2021 20:09   MR BRAIN W WO CONTRAST  Result Date: 02/11/2021 CLINICAL DATA:  Non-small cell lung cancer, staging EXAM: MRI HEAD WITHOUT AND WITH CONTRAST TECHNIQUE: Multiplanar, multiecho pulse sequences of the brain and surrounding structures were obtained without and with intravenous contrast. CONTRAST:  26m GADAVIST GADOBUTROL 1 MMOL/ML IV SOLN COMPARISON:  None. FINDINGS: Brain: Encephalomalacia in the right occipital lobe and right para hippocampal region with surrounding gliosis, compatible with remote right PCA territory infarct. Additional mild scattered T2 hyperintensities in the white matter, nonspecific but compatible with chronic microvascular ischemic disease. Mild for age generalized atrophy with ex vacuo ventricular dilation. No evidence of acute infarct, hydrocephalus, acute hemorrhage, mass lesion, midline shift,  or extra-axial fluid collection. No abnormal enhancement. Vascular: Major arterial flow voids are maintained at the skull base. Small/non dominant right vertebral artery, which appears patent on post-contrast imaging. Skull and upper cervical spine: Normal marrow signal. Sinuses/Orbits: Mild paranasal sinus mucosal thickening. Unremarkable orbits. Other: No sizable mastoid effusions. IMPRESSION: 1. No evidence of acute intracranial abnormality or metastatic disease. 2. Remote right PCA territory infarct. 3. Mild for age chronic microvascular ischemic disease and atrophy. Electronically Signed   By: FMargaretha SheffieldM.D.   On: 02/11/2021 19:43   MR LUMBAR SPINE WO CONTRAST  Result Date: 02/08/2021 CLINICAL DATA:  Low back pain, cancer suspected; technologist note states low back pain and bilateral leg weakness EXAM: MRI LUMBAR SPINE WITHOUT CONTRAST TECHNIQUE: Multiplanar, multisequence MR imaging of the lumbar spine was performed. No intravenous contrast was administered. COMPARISON:  None. FINDINGS: Segmentation:  Standard. Alignment:  No significant listhesis. Vertebrae: Vertebral body heights are maintained. There is patchy STIR hyperintensity at T12 and L1 including involvement of the right T12 pedicle with there may be some extraosseous extension. Patchy STIR hyperintensity at L3 eccentric to the right. Partially imaged possible STIR hyperintensity within the right sacrum. Overall decreased T1 marrow signal. Conus medullaris and cauda equina: Conus extends to the T12-L1 level. Conus and cauda equina appear normal. Paraspinal and other soft tissues: Unremarkable. Disc levels: L1-L2: Disc bulge eccentric to the right. No canal or foraminal stenosis. L2-L3: Disc bulge with  endplate osteophytes. Mild facet arthropathy with ligamentum flavum infolding. No canal or foraminal stenosis. L3-L4: Disc bulge with endplate osteophytes. Mild facet arthropathy with ligamentum flavum infolding. No canal or foraminal  stenosis. L4-L5: Disc bulge with endplate osteophytic ridging. Marked facet arthropathy with ligamentum flavum infolding. Minor canal stenosis. Effacement of subarticular recesses. Mild right foraminal stenosis with facet spurring contacting the exiting nerve root. Mild left foraminal stenosis. L5-S1: Marked disc height loss with probable partial bridging. Disc bulge with endplate osteophytic ridging. Moderate facet arthropathy. No canal stenosis. Minor foraminal stenosis. IMPRESSION: Overall abnormal marrow signal with areas of more discrete abnormality. Suspect metastatic disease. Multilevel degenerative changes without high-grade stenosis. Electronically Signed   By: Macy Mis M.D.   On: 02/08/2021 12:38   CT ABDOMEN PELVIS W CONTRAST  Result Date: 02/09/2021 CLINICAL DATA:  Cancer of unknown primary, staging; Metastatic disease evaluation EXAM: CT CHEST, ABDOMEN, AND PELVIS WITH CONTRAST TECHNIQUE: Multidetector CT imaging of the chest, abdomen and pelvis was performed following the standard protocol during bolus administration of intravenous contrast. CONTRAST:  125m OMNIPAQUE IOHEXOL 350 MG/ML SOLN COMPARISON:  February 08, 2021 FINDINGS: CT CHEST FINDINGS Cardiovascular: Heart is enlarged. Three-vessel coronary artery atherosclerotic calcifications. No pericardial effusion. Atherosclerotic calcifications of the aorta. Mediastinum/Nodes: There is irregular low-density lymph nodes within the AP window with representative conglomeration of lymph nodes measuring 21 mm (series 3, image 23). Irregular low-density LEFT inferior hilar lymph node versus soft tissue mass measure 16 mm in the short axis (series 3, image 30). Prominent RIGHT paratracheal lymph node measures 10 mm in the short axis (series 3, image 15). Coarsely calcified RIGHT mediastinal lymph nodes.There is a low-density RIGHT hilar lymph node which measures 12 mm in the short axis (series 3, image 30). No axillary adenopathy. Lungs/Pleura:  Along the LEFT lower lobe hilum, there is low-density soft tissue which demonstrates mild mass effect on the adjacent bronchus. It tracks along the LEFT main pulmonary artery and spans approximately 32 by 17 mm in representative cross-sectional area (series 6, image 299). There is irregularity of the LEFT bronchus at the bifurcation in the region of this mass, concerning for bronchial wall invasion. No pleural effusion or pneumothorax. Bibasilar enhancing consolidative opacities most consistent with atelectasis. In the peripheral RIGHT upper lobe, there is a wedge-shaped opacity with scattered internal lucency which measures 22 mm (series 5, image 32). No large central pulmonary embolism. There is a rounded pulmonary nodule of the LEFT upper lobe which measures 8 x 6 mm (series 5, image 61). Musculoskeletal: There is an oval nodule in the upper inner RIGHT breast which measures 9 mm (series 3, image 30). There is osseous destruction of the sternum with adjacent soft tissue mass most consistent with osseous metastatic disease. There is osseous destruction of the RIGHT posterolateral sixth rib with a pathologic fracture and associated soft tissue mass. Osseous destruction of the posterolateral LEFT eighth and eleventh there is a lytic lesion of T12 which extends into the RIGHT posterior element rib. Osseous destruction with associated soft tissue mass at the LEFT scapula. CT ABDOMEN PELVIS FINDINGS Hepatobiliary: There are innumerable hypodense masses throughout the liver. Many of these masses demonstrate fluid attenuation and likely reflect hepatic cysts. However several are indeterminate and may reflect metastatic disease. Representative indeterminate mass in the RIGHT liver measures 22 mm (series 3, image 71). Mass in the LEFT liver is irregular in appearance and is indeterminate; it spans 38 mm (series 3, image 50). Portal vein is patent. Gallbladder is distended with  likely cholelithiasis. Pancreas: Unremarkable.  No pancreatic ductal dilatation or surrounding inflammatory changes. Spleen: There are multiple hypodense masses in the spleen, nonspecific. Multiple punctate calcifications throughout the spleen consistent with sequela of prior granulomatous infection. Adrenals/Urinary Tract: There is an indeterminate 24 mm RIGHT adrenal nodule. The kidneys enhance symmetrically. No hydronephrosis. No obstructive nephrolithiasis. Bladder is circumferentially thick-walled, most consistent with sequela of chronic outlet obstruction. Stomach/Bowel: No evidence of bowel obstruction. Appendix is normal. Mild wall thickening of the terminal ileum. Vascular/Lymphatic: Atherosclerotic calcifications. No suspicious lymphadenopathy visualized within the pelvis or retroperitoneum. Reproductive: Prostate is present. Other: Trace free fluid in the pelvis. Musculoskeletal: Lytic lesions involving L1, L3, the LEFT iliac crest, LEFT iliac adjacent to the SI joint, LEFT sacrum, and RIGHT iliac bone. IMPRESSION: 1. There is an irregular masslike opacity along the LEFT lower lobe hilar border with mild mass effect and apparent invasion of the bronchus at this site. Findings are concerning for primary lung malignancy. 2. Irregular low-density mediastinal and bilateral hilar adenopathy are concerning for metastatic nodal disease. 3. Innumerable hypodense masses throughout the liver, many of which are cysts but several which are indeterminate and may reflect hepatic metastatic disease. These could be further characterized with dedicated MRI liver with contrast when and if clinically appropriate. 4. Indeterminate RIGHT adrenal nodule, suspicious for metastatic disease. This could be further assessed with MRI if clinically indicated. 5. There is osseous metastatic disease involving multiple vertebral bodies in the thoracic and lumbar spine, bilateral ribs, the LEFT scapula, the sternum and the LEFT greater than RIGHT pelvis. 6. Wedge shape opacity of the  RIGHT upper lobe with suggestion of internal lucency. Findings could be infectious or inflammatory in etiology. Given wedge-shaped appearance, differential considerations include pulmonary infarction. No central pulmonary embolism within the limitations of this exam. 7. Additional pulmonary nodule within the LEFT upper lobe is indeterminate and may reflect additional site of disease. 8. Nonspecific oval mass in the RIGHT breast. This could be further assessed with dedicated diagnostic mammogram and ultrasound if deemed clinically necessary. 9. Mild wall thickening of the terminal ileum, nonspecific. 10. Scattered hypodense masses throughout the spleen, nonspecific. Aortic Atherosclerosis (ICD10-I70.0). Electronically Signed   By: Valentino Saxon M.D.   On: 02/09/2021 20:09   MR LIVER W WO CONTRAST  Result Date: 02/11/2021 EXAM: MRI ABDOMEN WITHOUT AND WITH CONTRAST TECHNIQUE: Multiplanar multisequence MR imaging of the abdomen was performed both before and after the administration of intravenous contrast. CONTRAST:  53m GADAVIST GADOBUTROL 1 MMOL/ML IV SOLN COMPARISON:  None. FINDINGS: Lower chest:  Lung bases are clear. Hepatobiliary: Multiple round cystic lesions LEFT hepatic lobe. These are complex cystic lesions with small peripheral crypts bordering the cystic complexes. These are hyperintense on T2 weighted imaging and low signal intensity on T1 weighted imaging. Lesions have thin peripheral enhancement. For example lesion in the RIGHT hepatic lobe measuring 4.2 by 3.2 cm (image 17/series 17). There is a conglomerate of cystic enhancing lesions in the lateral segment LEFT hepatic lobe measuring 5.9 x 4.3 cm with SUV max on image 18/21. Similar cystic lesion with multiple small satellite cysts in the posterior RIGHT hepatic lobe measuring 3 cm on image 16/21. Pancreas: Normal pancreatic parenchymal intensity. No ductal dilatation or inflammation. Spleen: Similar cystic lesion with peripheral  enhancement in the spleen measuring 18 mm on image 35/series 21. Adrenals/urinary tract: Peripheral enhancing lesion of the adrenal gland measuring 2.4 cm (image 38/series 21. Stomach/Bowel: Stomach and limited of the small bowel is unremarkable Vascular/Lymphatic: Abdominal aortic normal  caliber. No retroperitoneal periportal lymphadenopathy. Musculoskeletal: There is an enhancing lesion in the L1 vertebral body (image 36/21. Small enhancing lesion in lower lumbar spine on image 73/21. Enlarged lesion of the posterior LEFT rib measuring 18 x 24 mm on image 40 4/21. IMPRESSION: 1. Unusual pattern of peripheral enhancing cystic complexes within LEFT and RIGHT hepatic lobe. 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 2. 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 3. Indeterminate lesion of the RIGHT adrenal gland concern for RIGHT adrenal metastasis. 4. Expansile lesion in the posterior LEFT rib is suggestive of metastatic lesion. Enhancing lesions within the spine lumbar spine are also concerning for metastatic lesions. Electronically Signed   By: Suzy Bouchard M.D.   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 Please perform CT-guided biopsy expansile lesion involving the lateral aspect of the right sixth rib for tissue diagnostic purposes. EXAM: CT-GUIDED BIOPSY OF EXPANSILE LESION INVOLVING LATERAL ASPECT OF THE RIGHT SIXTH RIB COMPARISON:  CT the chest, abdomen and pelvis-02/09/2021 MEDICATIONS: None. ANESTHESIA/SEDATION: Moderate (conscious) sedation was employed during this procedure as administered by the Interventional Radiology RN. A total of Versed 0.5 mg and Fentanyl 25 mcg was administered intravenously. Moderate Sedation Time: 12 minutes. The patient's level of consciousness and  vital signs were monitored continuously by radiology nursing throughout the procedure under my direct supervision. CONTRAST:  None. COMPLICATIONS: None immediate. PROCEDURE: Informed consent was obtained from the patient following an explanation of the procedure, risks, benefits and alternatives. A time out was performed prior to the initiation of the procedure. The patient was positioned supine, slightly LPO on the CT table and a limited CT was performed for procedural planning demonstrating unchanged size and appearance the approximately 3.6 x 2.1 cm expansile lesion involving the lateral aspect of the right sixth rib (image 24, series 3). The procedure was planned. The operative site was prepped and draped in the usual sterile fashion. Appropriate trajectory was confirmed with a 22 gauge spinal needle after the adjacent tissues were anesthetized with 1% Lidocaine with epinephrine. Under intermittent CT guidance, a 17 gauge coaxial needle was advanced into the peripheral aspect of the mass. Appropriate positioning was confirmed and 6 core needle biopsy samples were obtained with an 18 gauge core needle biopsy device. The co-axial needle was removed and hemostasis was achieved with manual compression. A limited postprocedural CT was negative for pneumothorax, hemorrhage or additional complication. A dressing was applied. The patient tolerated the procedure well without immediate postprocedural complication. IMPRESSION: Technically successful CT guided core needle biopsy of expansile lesion involving the lateral aspect of the right sixth rib. Electronically Signed   By: Sandi Mariscal M.D.   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 Isidore Moos The patient presented today with evidence of metastatic disease involving the lung as well as mediastinal lymphadenopathy and multiple suspicious liver lesion as well as multiple osseous metastasis  and right adrenal suspicious metastatic disease which was found in October 2022.  The patient had a repeat brain MRI which is negative for metastatic disease to the brain.    He also underwent a biopsy of the right sixth rib bone lesion which was consistent with recurrent poorly differentiated squamous cell carcinoma.  Dr. Julien Nordmann recommends a PET scan to restage his disease.  I placed the order.    Dr. Julien Nordmann had a lengthy discussion today with the patient about his current condition and recommended treatment options.  Dr. Julien Nordmann recommended treatment with palliative systemic chemotherapy with carboplatin for AUC of 5, paclitaxel 175 mg per metered squared and Keytruda 200 mg IV every 3 weeks with Neulasta support.  Patient is interested in this option and he said expected to receive his first dose of treatment on 02/25/21.   The adverse side effects of treatment were discussed including but not limited to peripheral neuropathy, alopecia, nausea, vomiting, liver, kidney dysfunction, and myelosuppression.  I will arrange for chemo education class prior to receiving his first cycle of treatment.  I sent a prescription for Compazine 10 mg p.o. every 6 hours as needed to the patient's pharmacy.   We will see him back for follow-up visit in 2 weeks for evaluation of 1 week follow-up visit to manage any adverse side effects of treatment.  He will try to use liquid Tylenol for his pain.  He knows to call us if his pain is not adequately managed with Tylenol.  The patient was advised to call immediately if he has any concerning  symptoms in the interval. The patient voices understanding of current disease status and treatment options and is in agreement with the current care plan. All questions were answered. The patient knows to call the clinic with any problems, questions or concerns. We can certainly see the patient much sooner if necessary        Orders Placed This Encounter  Procedures   NM PET Image Restag (PS) Skull Base To Thigh    Standing Status:   Future    Standing Expiration Date:   02/18/2022    Order Specific Question:   If indicated for the ordered procedure, I authorize the administration of a radiopharmaceutical per Radiology protocol    Answer:   Yes    Order Specific Question:   Preferred imaging location?    Answer:   [facility]   CBC with Differential (Cancer Center Only)    Standing Status:   Standing    Number of Occurrences:   12    Standing Expiration Date:   02/18/2022   CMP ([facility] only)    Standing Status:   Standing    Number of Occurrences:   12    Standing Expiration Date:   02/18/2022   TSH    Standing Status:   Standing    Number of Occurrences:   4    Standing Expiration Date:   02/18/2022       Marleny Faller L Alejo Beamer, PA-C 02/18/21  ADDENDUM: Hematology/Oncology Attending: I have a face-to-face encounter with the patient today.  I reviewed his record, lab, scan and recommended his care plan.  This is a very pleasant 80 years old white male with history of recurrent head and neck cancer status postsurgical resection followed by definitive radiotherapy.  The patient was recently diagnosed with metastatic squamous cell carcinoma likely of lung primary but metastatic head and neck cancer could not be completely excluded. I had a lengthy  discussion with the patient and his wife today about his current disease stage, prognosis and treatment options. I personally and independently reviewed his previous imaging studies as well as the recent pathology report. I  discussed with the patient his treatment options including palliative care and hospice referral versus consideration of palliative systemic chemotherapy with carboplatin for AUC of 5, paclitaxel 175 Mg/M2 and Keytruda 200 Mg IV every 3 weeks with Neulasta support for 4 cycles followed by maintenance treatment with single agent Keytruda every 3 weeks if the patient has no evidence for disease progression. The patient is interested in systemic chemotherapy. I discussed with him the adverse effect of this treatment including but not limited to alopecia, myelosuppression, nausea and vomiting, peripheral neuropathy, liver or renal dysfunction as well as immunotherapy adverse effects. He is expected to start the first cycle of this treatment next week. We will arrange for the patient to have a chemotherapy education class before the first dose of his treatment. The patient will come back for follow-up visit in 2 weeks for evaluation and management of any adverse effect of his treatment. He was advised to call immediately if he has any concerning symptoms in the interval. The total time spent in the appointment was 35 minutes.  Disclaimer: This note was dictated with voice recognition software. Similar sounding words can inadvertently be transcribed and may be missed upon review. Eilleen Kempf, MD 02/18/21

## 2021-02-18 ENCOUNTER — Other Ambulatory Visit: Payer: Self-pay

## 2021-02-18 ENCOUNTER — Inpatient Hospital Stay (HOSPITAL_BASED_OUTPATIENT_CLINIC_OR_DEPARTMENT_OTHER): Payer: Medicare Other | Admitting: Physician Assistant

## 2021-02-18 ENCOUNTER — Ambulatory Visit: Payer: Medicare Other

## 2021-02-18 ENCOUNTER — Other Ambulatory Visit: Payer: Self-pay | Admitting: Internal Medicine

## 2021-02-18 ENCOUNTER — Ambulatory Visit
Admission: RE | Admit: 2021-02-18 | Discharge: 2021-02-18 | Disposition: A | Discharge status: Home / Self Care | Payer: Medicare Other | Source: Ambulatory Visit | Attending: Radiation Oncology | Admitting: Radiation Oncology

## 2021-02-18 ENCOUNTER — Inpatient Hospital Stay: Payer: Medicare Other | Attending: Radiation Oncology

## 2021-02-18 VITALS — BP 105/58 | HR 72 | Temp 97.5°F | Resp 18 | Wt 204.1 lb

## 2021-02-18 DIAGNOSIS — Z510 Encounter for antineoplastic radiation therapy: Secondary | ICD-10-CM | POA: Diagnosis not present

## 2021-02-18 DIAGNOSIS — E279 Disorder of adrenal gland, unspecified: Secondary | ICD-10-CM | POA: Insufficient documentation

## 2021-02-18 DIAGNOSIS — Z483 Aftercare following surgery for neoplasm: Secondary | ICD-10-CM | POA: Insufficient documentation

## 2021-02-18 DIAGNOSIS — R5383 Other fatigue: Secondary | ICD-10-CM | POA: Diagnosis not present

## 2021-02-18 DIAGNOSIS — C031 Malignant neoplasm of lower gum: Secondary | ICD-10-CM

## 2021-02-18 DIAGNOSIS — C760 Malignant neoplasm of head, face and neck: Secondary | ICD-10-CM | POA: Insufficient documentation

## 2021-02-18 DIAGNOSIS — C801 Malignant (primary) neoplasm, unspecified: Secondary | ICD-10-CM | POA: Insufficient documentation

## 2021-02-18 DIAGNOSIS — C3490 Malignant neoplasm of unspecified part of unspecified bronchus or lung: Secondary | ICD-10-CM | POA: Diagnosis not present

## 2021-02-18 DIAGNOSIS — C7951 Secondary malignant neoplasm of bone: Secondary | ICD-10-CM | POA: Insufficient documentation

## 2021-02-18 DIAGNOSIS — R911 Solitary pulmonary nodule: Secondary | ICD-10-CM | POA: Insufficient documentation

## 2021-02-18 DIAGNOSIS — R591 Generalized enlarged lymph nodes: Secondary | ICD-10-CM | POA: Insufficient documentation

## 2021-02-18 DIAGNOSIS — I890 Lymphedema, not elsewhere classified: Secondary | ICD-10-CM | POA: Insufficient documentation

## 2021-02-18 DIAGNOSIS — R293 Abnormal posture: Secondary | ICD-10-CM | POA: Insufficient documentation

## 2021-02-18 LAB — CBC WITH DIFFERENTIAL (CANCER CENTER ONLY)
Abs Immature Granulocytes: 0.79 10*3/uL — ABNORMAL HIGH (ref 0.00–0.07)
Basophils Absolute: 0.1 10*3/uL (ref 0.0–0.1)
Basophils Relative: 1 %
Eosinophils Absolute: 0.1 10*3/uL (ref 0.0–0.5)
Eosinophils Relative: 1 %
HCT: 31.9 % — ABNORMAL LOW (ref 39.0–52.0)
Hemoglobin: 10.6 g/dL — ABNORMAL LOW (ref 13.0–17.0)
Immature Granulocytes: 3 %
Lymphocytes Relative: 1 %
Lymphs Abs: 0.3 10*3/uL — ABNORMAL LOW (ref 0.7–4.0)
MCH: 27.9 pg (ref 26.0–34.0)
MCHC: 33.2 g/dL (ref 30.0–36.0)
MCV: 83.9 fL (ref 80.0–100.0)
Monocytes Absolute: 1.2 10*3/uL — ABNORMAL HIGH (ref 0.1–1.0)
Monocytes Relative: 5 %
Neutro Abs: 20.5 10*3/uL — ABNORMAL HIGH (ref 1.7–7.7)
Neutrophils Relative %: 89 %
Platelet Count: 287 10*3/uL (ref 150–400)
RBC: 3.8 MIL/uL — ABNORMAL LOW (ref 4.22–5.81)
RDW: 14 % (ref 11.5–15.5)
WBC Count: 23 10*3/uL — ABNORMAL HIGH (ref 4.0–10.5)
nRBC: 0 % (ref 0.0–0.2)

## 2021-02-18 LAB — CMP (CANCER CENTER ONLY)
ALT: 22 U/L (ref 0–44)
AST: 22 U/L (ref 15–41)
Albumin: 2.5 g/dL — ABNORMAL LOW (ref 3.5–5.0)
Alkaline Phosphatase: 186 U/L — ABNORMAL HIGH (ref 38–126)
Anion gap: 6 (ref 5–15)
BUN: 14 mg/dL (ref 8–23)
CO2: 29 mmol/L (ref 22–32)
Calcium: 8.8 mg/dL — ABNORMAL LOW (ref 8.9–10.3)
Chloride: 98 mmol/L (ref 98–111)
Creatinine: 0.85 mg/dL (ref 0.61–1.24)
GFR, Estimated: >60 mL/min (ref >60)
Glucose, Bld: 112 mg/dL — ABNORMAL HIGH (ref 70–99)
Potassium: 4.5 mmol/L (ref 3.5–5.1)
Sodium: 133 mmol/L — ABNORMAL LOW (ref 135–145)
Total Bilirubin: 1 mg/dL (ref 0.3–1.2)
Total Protein: 7.1 g/dL (ref 6.5–8.1)

## 2021-02-18 MED ORDER — PROCHLORPERAZINE MALEATE 10 MG PO TABS: 10.0000 mg | ORAL_TABLET | Freq: Four times a day (QID) | ORAL | 2 refills | Status: DC | PRN

## 2021-02-18 NOTE — Patient Instructions (Addendum)
-  The sample (biopsy) that they took of your tumor was consistent with a subtype of Non-small cell lung cancer called Squamous Cell Carcinoma.  -We covered a lot of important information at your appointment today regarding what the treatment plan is moving forward. Here are the the main points that were discussed at your office visit with [place] today:  -The treatment that you will receive consists of two chemotherapy drugs, called Carboplatin and Paclitaxel (also referred to as Taxol) and one immunotherapy drug called Keytruda (pembrolizumab).  -We are planning on starting your treatment next week on 02/25/10 but before your start your treatment, I would like you to attend a Chemotherapy Education Class. This involves having you sit down with one of our nurse educators. She will discuss with you one-on-one more details about your treatment as well as general information about resources here at the cancer center.  -Your treatment will be given once every 3 weeks. We will check your labs once a week for the first ~5 treatments just to make sure that important components of your blood are in an acceptable range -You will need to return 2 days after your treatment to receive an injection. This injection is important because it boosts your infection fighting cells in your body and helps protect you from getting an infection. This injection can cause bone pain. Advised to take 1 tablet of Claritin daily for 4-7 days starting the day of the shot.  -We will get a CT scan after 3 treatments to check on the progress of treatment  Medications:  -Compazine was sent to your pharmacy. This medication is for nausea. You may take this every 6 hours as needed if you feel nausous.   Referrals or Imaging:  -I will place an order for a PET scan to check on the rest of the body. They should call you to schedule this. If you need to call them, their number is [phone].   Follow up:  -We will see you back for a follow up  visit in about  2 weeks to see how your first treatment went and to make sure you are not having any side effects from treatment.   If you need to contact our office, please do not hesitate, we are here to help. Our number is [phone]. When you call, ask to speak to Cassie's or Dr. Worthy Flank nurse.

## 2021-02-18 NOTE — Progress Notes (Signed)
START ON PATHWAY REGIMEN - Non-Small Cell Lung     A cycle is every 21 days:     Pembrolizumab      Paclitaxel      Carboplatin   **Always confirm dose/schedule in your pharmacy ordering system**  Patient Characteristics: Stage IV Metastatic, Squamous, Molecular Analysis Not Elected, PS = 0, 1, Initial Chemotherapy/Immunotherapy, Immunotherapy Candidate, PD-L1 Expression Positive 1-49% (TPS) / Negative / Not Tested / Awaiting Test Results Therapeutic Status: Stage IV Metastatic Histology: Squamous Cell ECOG Performance Status: 1 Chemotherapy/Immunotherapy Line of Therapy: Initial Chemotherapy/Immunotherapy Immunotherapy Candidate Status: Candidate for Immunotherapy PD-L1 Expression Status: Awaiting Test Results Intent of Therapy: Non-Curative / Palliative Intent, Discussed with Patient

## 2021-02-19 ENCOUNTER — Telehealth (HOSPITAL_COMMUNITY): Payer: Self-pay

## 2021-02-19 ENCOUNTER — Encounter: Payer: Self-pay | Admitting: Radiation Oncology

## 2021-02-19 ENCOUNTER — Ambulatory Visit: Payer: Medicare Other

## 2021-02-19 ENCOUNTER — Ambulatory Visit
Admission: RE | Admit: 2021-02-19 | Discharge: 2021-02-19 | Disposition: A | Discharge status: Home / Self Care | Payer: Medicare Other | Source: Ambulatory Visit | Attending: Radiation Oncology | Admitting: Radiation Oncology

## 2021-02-19 DIAGNOSIS — R293 Abnormal posture: Secondary | ICD-10-CM | POA: Insufficient documentation

## 2021-02-19 DIAGNOSIS — Z510 Encounter for antineoplastic radiation therapy: Secondary | ICD-10-CM | POA: Insufficient documentation

## 2021-02-19 DIAGNOSIS — Z5111 Encounter for antineoplastic chemotherapy: Secondary | ICD-10-CM | POA: Diagnosis not present

## 2021-02-19 DIAGNOSIS — E876 Hypokalemia: Secondary | ICD-10-CM | POA: Diagnosis not present

## 2021-02-19 DIAGNOSIS — Z5112 Encounter for antineoplastic immunotherapy: Secondary | ICD-10-CM | POA: Diagnosis not present

## 2021-02-19 DIAGNOSIS — C031 Malignant neoplasm of lower gum: Secondary | ICD-10-CM | POA: Insufficient documentation

## 2021-02-19 DIAGNOSIS — C7951 Secondary malignant neoplasm of bone: Secondary | ICD-10-CM | POA: Diagnosis not present

## 2021-02-19 DIAGNOSIS — Z5189 Encounter for other specified aftercare: Secondary | ICD-10-CM | POA: Diagnosis not present

## 2021-02-19 DIAGNOSIS — I890 Lymphedema, not elsewhere classified: Secondary | ICD-10-CM | POA: Insufficient documentation

## 2021-02-19 DIAGNOSIS — Z483 Aftercare following surgery for neoplasm: Secondary | ICD-10-CM | POA: Insufficient documentation

## 2021-02-19 DIAGNOSIS — C801 Malignant (primary) neoplasm, unspecified: Secondary | ICD-10-CM | POA: Diagnosis not present

## 2021-02-19 DIAGNOSIS — C7800 Secondary malignant neoplasm of unspecified lung: Secondary | ICD-10-CM | POA: Diagnosis not present

## 2021-02-19 DIAGNOSIS — C787 Secondary malignant neoplasm of liver and intrahepatic bile duct: Secondary | ICD-10-CM | POA: Diagnosis not present

## 2021-02-19 DIAGNOSIS — Z79899 Other long term (current) drug therapy: Secondary | ICD-10-CM | POA: Diagnosis not present

## 2021-02-19 DIAGNOSIS — C7971 Secondary malignant neoplasm of right adrenal gland: Secondary | ICD-10-CM | POA: Diagnosis not present

## 2021-02-20 ENCOUNTER — Ambulatory Visit: Payer: Medicare Other

## 2021-02-20 ENCOUNTER — Other Ambulatory Visit: Payer: Self-pay | Admitting: Physician Assistant

## 2021-02-20 ENCOUNTER — Ambulatory Visit
Admission: RE | Admit: 2021-02-20 | Discharge: 2021-02-20 | Disposition: A | Discharge status: Home / Self Care | Payer: Medicare Other | Source: Ambulatory Visit | Attending: Radiation Oncology | Admitting: Radiation Oncology

## 2021-02-20 DIAGNOSIS — Z5111 Encounter for antineoplastic chemotherapy: Secondary | ICD-10-CM | POA: Diagnosis not present

## 2021-02-20 DIAGNOSIS — C7951 Secondary malignant neoplasm of bone: Secondary | ICD-10-CM | POA: Diagnosis not present

## 2021-02-20 DIAGNOSIS — C7800 Secondary malignant neoplasm of unspecified lung: Secondary | ICD-10-CM | POA: Diagnosis not present

## 2021-02-20 DIAGNOSIS — Z510 Encounter for antineoplastic radiation therapy: Secondary | ICD-10-CM | POA: Diagnosis not present

## 2021-02-20 DIAGNOSIS — Z5112 Encounter for antineoplastic immunotherapy: Secondary | ICD-10-CM | POA: Diagnosis not present

## 2021-02-20 DIAGNOSIS — C801 Malignant (primary) neoplasm, unspecified: Secondary | ICD-10-CM | POA: Diagnosis not present

## 2021-02-20 DIAGNOSIS — C031 Malignant neoplasm of lower gum: Secondary | ICD-10-CM | POA: Diagnosis not present

## 2021-02-21 ENCOUNTER — Ambulatory Visit: Payer: Medicare Other

## 2021-02-21 ENCOUNTER — Ambulatory Visit
Admission: RE | Admit: 2021-02-21 | Discharge: 2021-02-21 | Disposition: A | Discharge status: Home / Self Care | Payer: Medicare Other | Source: Ambulatory Visit | Attending: Radiation Oncology | Admitting: Radiation Oncology

## 2021-02-21 DIAGNOSIS — Z510 Encounter for antineoplastic radiation therapy: Secondary | ICD-10-CM | POA: Diagnosis not present

## 2021-02-21 DIAGNOSIS — C031 Malignant neoplasm of lower gum: Secondary | ICD-10-CM | POA: Diagnosis not present

## 2021-02-21 DIAGNOSIS — Z5111 Encounter for antineoplastic chemotherapy: Secondary | ICD-10-CM | POA: Diagnosis not present

## 2021-02-21 DIAGNOSIS — C801 Malignant (primary) neoplasm, unspecified: Secondary | ICD-10-CM | POA: Diagnosis not present

## 2021-02-21 DIAGNOSIS — Z5112 Encounter for antineoplastic immunotherapy: Secondary | ICD-10-CM | POA: Diagnosis not present

## 2021-02-21 DIAGNOSIS — C7800 Secondary malignant neoplasm of unspecified lung: Secondary | ICD-10-CM | POA: Diagnosis not present

## 2021-02-21 DIAGNOSIS — C7951 Secondary malignant neoplasm of bone: Secondary | ICD-10-CM | POA: Diagnosis not present

## 2021-02-22 ENCOUNTER — Ambulatory Visit
Admission: RE | Admit: 2021-02-22 | Discharge: 2021-02-22 | Disposition: A | Discharge status: Home / Self Care | Payer: Medicare Other | Source: Ambulatory Visit | Attending: Radiation Oncology | Admitting: Radiation Oncology

## 2021-02-22 ENCOUNTER — Ambulatory Visit: Payer: Medicare Other

## 2021-02-22 DIAGNOSIS — C7800 Secondary malignant neoplasm of unspecified lung: Secondary | ICD-10-CM | POA: Diagnosis not present

## 2021-02-22 DIAGNOSIS — Z5111 Encounter for antineoplastic chemotherapy: Secondary | ICD-10-CM | POA: Diagnosis not present

## 2021-02-22 DIAGNOSIS — C7951 Secondary malignant neoplasm of bone: Secondary | ICD-10-CM | POA: Diagnosis not present

## 2021-02-22 DIAGNOSIS — Z510 Encounter for antineoplastic radiation therapy: Secondary | ICD-10-CM | POA: Diagnosis not present

## 2021-02-22 DIAGNOSIS — Z5112 Encounter for antineoplastic immunotherapy: Secondary | ICD-10-CM | POA: Diagnosis not present

## 2021-02-22 DIAGNOSIS — C801 Malignant (primary) neoplasm, unspecified: Secondary | ICD-10-CM | POA: Diagnosis not present

## 2021-02-22 DIAGNOSIS — C031 Malignant neoplasm of lower gum: Secondary | ICD-10-CM | POA: Diagnosis not present

## 2021-02-22 MED FILL — Fosaprepitant Dimeglumine For IV Infusion 150 MG (Base Eq): INTRAVENOUS | Qty: 5 | Status: AC

## 2021-02-22 MED FILL — Dexamethasone Sodium Phosphate Inj 100 MG/10ML: INTRAMUSCULAR | Qty: 1 | Status: AC

## 2021-02-25 ENCOUNTER — Inpatient Hospital Stay: Payer: Medicare Other | Attending: Radiation Oncology

## 2021-02-25 ENCOUNTER — Ambulatory Visit: Payer: Medicare Other

## 2021-02-25 ENCOUNTER — Other Ambulatory Visit: Payer: Self-pay

## 2021-02-25 ENCOUNTER — Ambulatory Visit
Admission: RE | Admit: 2021-02-25 | Discharge: 2021-02-25 | Disposition: A | Discharge status: Home / Self Care | Payer: Medicare Other | Source: Ambulatory Visit | Attending: Radiation Oncology | Admitting: Radiation Oncology

## 2021-02-25 DIAGNOSIS — Z5111 Encounter for antineoplastic chemotherapy: Secondary | ICD-10-CM | POA: Diagnosis not present

## 2021-02-25 DIAGNOSIS — Z5112 Encounter for antineoplastic immunotherapy: Secondary | ICD-10-CM | POA: Diagnosis not present

## 2021-02-25 DIAGNOSIS — C7800 Secondary malignant neoplasm of unspecified lung: Secondary | ICD-10-CM | POA: Diagnosis not present

## 2021-02-25 DIAGNOSIS — C787 Secondary malignant neoplasm of liver and intrahepatic bile duct: Secondary | ICD-10-CM | POA: Insufficient documentation

## 2021-02-25 DIAGNOSIS — Z79899 Other long term (current) drug therapy: Secondary | ICD-10-CM | POA: Insufficient documentation

## 2021-02-25 DIAGNOSIS — E876 Hypokalemia: Secondary | ICD-10-CM | POA: Insufficient documentation

## 2021-02-25 DIAGNOSIS — I890 Lymphedema, not elsewhere classified: Secondary | ICD-10-CM | POA: Insufficient documentation

## 2021-02-25 DIAGNOSIS — C7951 Secondary malignant neoplasm of bone: Secondary | ICD-10-CM | POA: Diagnosis not present

## 2021-02-25 DIAGNOSIS — R293 Abnormal posture: Secondary | ICD-10-CM | POA: Insufficient documentation

## 2021-02-25 DIAGNOSIS — C031 Malignant neoplasm of lower gum: Secondary | ICD-10-CM | POA: Insufficient documentation

## 2021-02-25 DIAGNOSIS — C801 Malignant (primary) neoplasm, unspecified: Secondary | ICD-10-CM | POA: Insufficient documentation

## 2021-02-25 DIAGNOSIS — Z5189 Encounter for other specified aftercare: Secondary | ICD-10-CM | POA: Insufficient documentation

## 2021-02-25 DIAGNOSIS — Z483 Aftercare following surgery for neoplasm: Secondary | ICD-10-CM | POA: Insufficient documentation

## 2021-02-25 DIAGNOSIS — Z510 Encounter for antineoplastic radiation therapy: Secondary | ICD-10-CM | POA: Insufficient documentation

## 2021-02-25 DIAGNOSIS — C7971 Secondary malignant neoplasm of right adrenal gland: Secondary | ICD-10-CM | POA: Insufficient documentation

## 2021-02-26 ENCOUNTER — Ambulatory Visit
Admission: RE | Admit: 2021-02-26 | Discharge: 2021-02-26 | Disposition: A | Discharge status: Home / Self Care | Payer: Medicare Other | Source: Ambulatory Visit | Attending: Radiation Oncology | Admitting: Radiation Oncology

## 2021-02-26 ENCOUNTER — Ambulatory Visit: Payer: Medicare Other

## 2021-02-26 DIAGNOSIS — C801 Malignant (primary) neoplasm, unspecified: Secondary | ICD-10-CM | POA: Diagnosis not present

## 2021-02-26 DIAGNOSIS — C031 Malignant neoplasm of lower gum: Secondary | ICD-10-CM | POA: Diagnosis not present

## 2021-02-26 DIAGNOSIS — Z5111 Encounter for antineoplastic chemotherapy: Secondary | ICD-10-CM | POA: Diagnosis not present

## 2021-02-26 DIAGNOSIS — C7800 Secondary malignant neoplasm of unspecified lung: Secondary | ICD-10-CM | POA: Diagnosis not present

## 2021-02-26 DIAGNOSIS — Z5112 Encounter for antineoplastic immunotherapy: Secondary | ICD-10-CM | POA: Diagnosis not present

## 2021-02-26 DIAGNOSIS — C7951 Secondary malignant neoplasm of bone: Secondary | ICD-10-CM | POA: Diagnosis not present

## 2021-02-26 DIAGNOSIS — Z510 Encounter for antineoplastic radiation therapy: Secondary | ICD-10-CM | POA: Diagnosis not present

## 2021-02-27 ENCOUNTER — Inpatient Hospital Stay: Payer: Medicare Other

## 2021-02-27 ENCOUNTER — Other Ambulatory Visit: Payer: Self-pay

## 2021-02-27 ENCOUNTER — Ambulatory Visit
Admission: RE | Admit: 2021-02-27 | Discharge: 2021-02-27 | Disposition: A | Discharge status: Home / Self Care | Payer: Medicare Other | Source: Ambulatory Visit | Attending: Radiation Oncology | Admitting: Radiation Oncology

## 2021-02-27 ENCOUNTER — Ambulatory Visit: Payer: Medicare Other

## 2021-02-27 ENCOUNTER — Encounter: Payer: Self-pay | Admitting: Radiation Oncology

## 2021-02-27 VITALS — BP 113/74 | HR 95 | Temp 98.2°F | Resp 18 | Ht 71.5 in | Wt 201.2 lb

## 2021-02-27 DIAGNOSIS — C7951 Secondary malignant neoplasm of bone: Secondary | ICD-10-CM | POA: Diagnosis not present

## 2021-02-27 DIAGNOSIS — C7800 Secondary malignant neoplasm of unspecified lung: Secondary | ICD-10-CM | POA: Diagnosis not present

## 2021-02-27 DIAGNOSIS — C3490 Malignant neoplasm of unspecified part of unspecified bronchus or lung: Secondary | ICD-10-CM

## 2021-02-27 DIAGNOSIS — Z5111 Encounter for antineoplastic chemotherapy: Secondary | ICD-10-CM | POA: Diagnosis not present

## 2021-02-27 DIAGNOSIS — R5383 Other fatigue: Secondary | ICD-10-CM

## 2021-02-27 DIAGNOSIS — Z5112 Encounter for antineoplastic immunotherapy: Secondary | ICD-10-CM | POA: Diagnosis not present

## 2021-02-27 DIAGNOSIS — C801 Malignant (primary) neoplasm, unspecified: Secondary | ICD-10-CM | POA: Diagnosis not present

## 2021-02-27 DIAGNOSIS — C031 Malignant neoplasm of lower gum: Secondary | ICD-10-CM | POA: Diagnosis not present

## 2021-02-27 DIAGNOSIS — Z510 Encounter for antineoplastic radiation therapy: Secondary | ICD-10-CM | POA: Diagnosis not present

## 2021-02-27 LAB — CBC WITH DIFFERENTIAL (CANCER CENTER ONLY)
Abs Immature Granulocytes: 0.65 10*3/uL — ABNORMAL HIGH (ref 0.00–0.07)
Basophils Absolute: 0.1 10*3/uL (ref 0.0–0.1)
Basophils Relative: 0 %
Eosinophils Absolute: 0.2 10*3/uL (ref 0.0–0.5)
Eosinophils Relative: 1 %
HCT: 29.7 % — ABNORMAL LOW (ref 39.0–52.0)
Hemoglobin: 9.6 g/dL — ABNORMAL LOW (ref 13.0–17.0)
Immature Granulocytes: 3 %
Lymphocytes Relative: 0 %
Lymphs Abs: 0.1 10*3/uL — ABNORMAL LOW (ref 0.7–4.0)
MCH: 27 pg (ref 26.0–34.0)
MCHC: 32.3 g/dL (ref 30.0–36.0)
MCV: 83.7 fL (ref 80.0–100.0)
Monocytes Absolute: 1.1 10*3/uL — ABNORMAL HIGH (ref 0.1–1.0)
Monocytes Relative: 4 %
Neutro Abs: 24 10*3/uL — ABNORMAL HIGH (ref 1.7–7.7)
Neutrophils Relative %: 92 %
Platelet Count: 254 10*3/uL (ref 150–400)
RBC: 3.55 MIL/uL — ABNORMAL LOW (ref 4.22–5.81)
RDW: 14.1 % (ref 11.5–15.5)
WBC Count: 26 10*3/uL — ABNORMAL HIGH (ref 4.0–10.5)
nRBC: 0 % (ref 0.0–0.2)

## 2021-02-27 LAB — CMP (CANCER CENTER ONLY)
ALT: 31 U/L (ref 0–44)
AST: 30 U/L (ref 15–41)
Albumin: 2.1 g/dL — ABNORMAL LOW (ref 3.5–5.0)
Alkaline Phosphatase: 211 U/L — ABNORMAL HIGH (ref 38–126)
Anion gap: 9 (ref 5–15)
BUN: 17 mg/dL (ref 8–23)
CO2: 24 mmol/L (ref 22–32)
Calcium: 9.7 mg/dL (ref 8.9–10.3)
Chloride: 95 mmol/L — ABNORMAL LOW (ref 98–111)
Creatinine: 0.82 mg/dL (ref 0.61–1.24)
GFR, Estimated: >60 mL/min (ref >60)
Glucose, Bld: 147 mg/dL — ABNORMAL HIGH (ref 70–99)
Potassium: 3.5 mmol/L (ref 3.5–5.1)
Sodium: 128 mmol/L — ABNORMAL LOW (ref 135–145)
Total Bilirubin: 0.6 mg/dL (ref 0.3–1.2)
Total Protein: 6.6 g/dL (ref 6.5–8.1)

## 2021-02-27 LAB — TSH: TSH: 0.5 u[IU]/mL (ref 0.320–4.118)

## 2021-02-27 MED ORDER — DIPHENHYDRAMINE HCL 50 MG/ML IJ SOLN
50.0000 mg | Freq: Once | INTRAMUSCULAR | Status: AC
  Administered 2021-02-27: 50 mg via INTRAVENOUS
  Filled 2021-02-27: qty 1

## 2021-02-27 MED ORDER — SODIUM CHLORIDE 0.9 % IV SOLN
200.0000 mg | Freq: Once | INTRAVENOUS | Status: AC
  Administered 2021-02-27: 200 mg via INTRAVENOUS
  Filled 2021-02-27: qty 8

## 2021-02-27 MED ORDER — FAMOTIDINE 20 MG IN NS 100 ML IVPB
20.0000 mg | Freq: Once | INTRAVENOUS | Status: AC
  Administered 2021-02-27: 20 mg via INTRAVENOUS
  Filled 2021-02-27: qty 100

## 2021-02-27 MED ORDER — SODIUM CHLORIDE 0.9 % IV SOLN
150.0000 mg | Freq: Once | INTRAVENOUS | Status: AC
  Administered 2021-02-27: 150 mg via INTRAVENOUS
  Filled 2021-02-27: qty 150

## 2021-02-27 MED ORDER — SODIUM CHLORIDE 0.9 % IV SOLN
530.0000 mg | Freq: Once | INTRAVENOUS | Status: AC
  Administered 2021-02-27: 530 mg via INTRAVENOUS
  Filled 2021-02-27: qty 53

## 2021-02-27 MED ORDER — SODIUM CHLORIDE 0.9 % IV SOLN
10.0000 mg | Freq: Once | INTRAVENOUS | Status: AC
  Administered 2021-02-27: 10 mg via INTRAVENOUS
  Filled 2021-02-27: qty 10

## 2021-02-27 MED ORDER — PALONOSETRON HCL INJECTION 0.25 MG/5ML
0.2500 mg | Freq: Once | INTRAVENOUS | Status: AC
  Administered 2021-02-27: 0.25 mg via INTRAVENOUS
  Filled 2021-02-27: qty 5

## 2021-02-27 MED ORDER — SODIUM CHLORIDE 0.9 % IV SOLN
175.0000 mg/m2 | Freq: Once | INTRAVENOUS | Status: AC
  Administered 2021-02-27: 378 mg via INTRAVENOUS
  Filled 2021-02-27: qty 63

## 2021-02-27 MED ORDER — SODIUM CHLORIDE 0.9 % IV SOLN: Freq: Once | INTRAVENOUS | Status: AC

## 2021-02-27 NOTE — Patient Instructions (Signed)
[facility]  Discharge Instructions: Thank you for choosing [facility] to provide your oncology and hematology care.   If you have a lab appointment with the [facility], please go directly to the [facility] and check in at the registration area.   Wear comfortable clothing and clothing appropriate for easy access to any Portacath or PICC line.   We strive to give you quality time with your provider. You may need to reschedule your appointment if you arrive late (15 or more minutes).  Arriving late affects you and other patients whose appointments are after yours.  Also, if you miss three or more appointments without notifying the office, you may be dismissed from the clinic at the provider's discretion.      For prescription refill requests, have your pharmacy contact our office and allow 72 hours for refills to be completed.    Today you received the following chemotherapy and/or immunotherapy agents    Pembrolizumab (Keytruda), Paclitaxel (Taxol), Carboplatin   To help prevent nausea and vomiting after your treatment, we encourage you to take your nausea medication as directed.  BELOW ARE SYMPTOMS THAT SHOULD BE REPORTED IMMEDIATELY: *FEVER GREATER THAN 100.4 F (38 C) OR HIGHER *CHILLS OR SWEATING *NAUSEA AND VOMITING THAT IS NOT CONTROLLED WITH YOUR NAUSEA MEDICATION *UNUSUAL SHORTNESS OF BREATH *UNUSUAL BRUISING OR BLEEDING *URINARY PROBLEMS (pain or burning when urinating, or frequent urination) *BOWEL PROBLEMS (unusual diarrhea, constipation, pain near the anus) TENDERNESS IN MOUTH AND THROAT WITH OR WITHOUT PRESENCE OF ULCERS (sore throat, sores in mouth, or a toothache) UNUSUAL RASH, SWELLING OR PAIN  UNUSUAL VAGINAL DISCHARGE OR ITCHING   Items with * indicate a potential emergency and should be followed up as soon as possible or go to the Emergency Department if any problems should occur.  Please show the CHEMOTHERAPY ALERT  CARD or IMMUNOTHERAPY ALERT CARD at check-in to the Emergency Department and triage nurse.  Should you have questions after your visit or need to cancel or reschedule your appointment, please contact [facility]  Dept: [phone]  and follow the prompts.  Office hours are 8:00 a.m. to 4:30 p.m. Monday - Friday. Please note that voicemails left after 4:00 p.m. may not be returned until the following business day.  We are closed weekends and major holidays. You have access to a nurse at all times for urgent questions. Please call the main number to the clinic Dept: [phone] and follow the prompts.   For any non-urgent questions, you may also contact your provider using MyChart. We now offer e-Visits for anyone 60 and older to request care online for non-urgent symptoms. For details visit [email or web address].   Also download the MyChart app! Go to the app store, search "MyChart", open the app, select [facility], and log in with your MyChart username and password.  Due to Covid, a mask is required upon entering the hospital/clinic. If you do not have a mask, one will be given to you upon arrival. For doctor visits, patients may have 1 support person aged 64 or older with them. For treatment visits, patients cannot have anyone with them due to current Covid guidelines and our immunocompromised population.   Pembrolizumab (Keytruda) injection What is this medication? PEMBROLIZUMAB (pem broe liz ue mab) is a monoclonal antibody. It is used to treat certain types of cancer. This medicine may be used for other purposes; ask your health care provider or pharmacist if you have questions.  COMMON BRAND NAME(S): Keytruda What should I tell my care team before I take this medication? They need to know if you have any of these conditions: autoimmune diseases like Crohn's disease, ulcerative colitis, or lupus have had or planning to have an allogeneic stem cell transplant  (uses someone else's stem cells) history of organ transplant history of chest radiation nervous system problems like myasthenia gravis or Guillain-Barre syndrome an unusual or allergic reaction to pembrolizumab, other medicines, foods, dyes, or preservatives pregnant or trying to get pregnant breast-feeding How should I use this medication? This medicine is for infusion into a vein. It is given by a health care professional in a hospital or clinic setting. A special MedGuide will be given to you before each treatment. Be sure to read this information carefully each time. Talk to your pediatrician regarding the use of this medicine in children. While this drug may be prescribed for children as young as 6 months for selected conditions, precautions do apply. Overdosage: If you think you have taken too much of this medicine contact a poison control center or emergency room at once. NOTE: This medicine is only for you. Do not share this medicine with others. What if I miss a dose? It is important not to miss your dose. Call your doctor or health care professional if you are unable to keep an appointment. What may interact with this medication? Interactions have not been studied. This list may not describe all possible interactions. Give your health care provider a list of all the medicines, herbs, non-prescription drugs, or dietary supplements you use. Also tell them if you smoke, drink alcohol, or use illegal drugs. Some items may interact with your medicine. What should I watch for while using this medication? Your condition will be monitored carefully while you are receiving this medicine. You may need blood work done while you are taking this medicine. Do not become pregnant while taking this medicine or for 4 months after stopping it. Women should inform their doctor if they wish to become pregnant or think they might be pregnant. There is a potential for serious side effects to an unborn  child. Talk to your health care professional or pharmacist for more information. Do not breast-feed an infant while taking this medicine or for 4 months after the last dose. What side effects may I notice from receiving this medication? Side effects that you should report to your doctor or health care professional as soon as possible: allergic reactions like skin rash, itching or hives, swelling of the face, lips, or tongue bloody or black, tarry breathing problems changes in vision chest pain chills confusion constipation cough diarrhea dizziness or feeling faint or lightheaded fast or irregular heartbeat fever flushing joint pain low blood counts - this medicine may decrease the number of white blood cells, red blood cells and platelets. You may be at increased risk for infections and bleeding. muscle pain muscle weakness pain, tingling, numbness in the hands or feet persistent headache redness, blistering, peeling or loosening of the skin, including inside the mouth signs and symptoms of high blood sugar such as dizziness; dry mouth; dry skin; fruity breath; nausea; stomach pain; increased hunger or thirst; increased urination signs and symptoms of kidney injury like trouble passing urine or change in the amount of urine signs and symptoms of liver injury like dark urine, light-colored stools, loss of appetite, nausea, right upper belly pain, yellowing of the eyes or skin sweating swollen lymph nodes weight loss Side effects that  usually do not require medical attention (report to your doctor or health care professional if they continue or are bothersome): decreased appetite hair loss tiredness This list may not describe all possible side effects. Call your doctor for medical advice about side effects. You may report side effects to FDA at 1-800-FDA-1088. Where should I keep my medication? This drug is given in a hospital or clinic and will not be stored at home. NOTE: This  sheet is a summary. It may not cover all possible information. If you have questions about this medicine, talk to your doctor, pharmacist, or health care provider.  2022 Elsevier/Gold Standard (2020-12-25 00:00:00)  Paclitaxel (Taxol) injection What is this medication? PACLITAXEL (PAK li TAX el) is a chemotherapy drug. It targets fast dividing cells, like cancer cells, and causes these cells to die. This medicine is used to treat ovarian cancer, breast cancer, lung cancer, Kaposi's sarcoma, and other cancers. This medicine may be used for other purposes; ask your health care provider or pharmacist if you have questions. COMMON BRAND NAME(S): Onxol, Taxol What should I tell my care team before I take this medication? They need to know if you have any of these conditions: history of irregular heartbeat liver disease low blood counts, like low white cell, platelet, or red cell counts lung or breathing disease, like asthma tingling of the fingers or toes, or other nerve disorder an unusual or allergic reaction to paclitaxel, alcohol, polyoxyethylated castor oil, other chemotherapy, other medicines, foods, dyes, or preservatives pregnant or trying to get pregnant breast-feeding How should I use this medication? This drug is given as an infusion into a vein. It is administered in a hospital or clinic by a specially trained health care professional. Talk to your pediatrician regarding the use of this medicine in children. Special care may be needed. Overdosage: If you think you have taken too much of this medicine contact a poison control center or emergency room at once. NOTE: This medicine is only for you. Do not share this medicine with others. What if I miss a dose? It is important not to miss your dose. Call your doctor or health care professional if you are unable to keep an appointment. What may interact with this medication? Do not take this medicine with any of the following  medications: live virus vaccines This medicine may also interact with the following medications: antiviral medicines for hepatitis, HIV or AIDS certain antibiotics like erythromycin and clarithromycin certain medicines for fungal infections like ketoconazole and itraconazole certain medicines for seizures like carbamazepine, phenobarbital, phenytoin gemfibrozil nefazodone rifampin St. John's wort This list may not describe all possible interactions. Give your health care provider a list of all the medicines, herbs, non-prescription drugs, or dietary supplements you use. Also tell them if you smoke, drink alcohol, or use illegal drugs. Some items may interact with your medicine. What should I watch for while using this medication? Your condition will be monitored carefully while you are receiving this medicine. You will need important blood work done while you are taking this medicine. This medicine can cause serious allergic reactions. To reduce your risk you will need to take other medicine(s) before treatment with this medicine. If you experience allergic reactions like skin rash, itching or hives, swelling of the face, lips, or tongue, tell your doctor or health care professional right away. In some cases, you may be given additional medicines to help with side effects. Follow all directions for their use. This drug may make you feel  generally unwell. This is not uncommon, as chemotherapy can affect healthy cells as well as cancer cells. Report any side effects. Continue your course of treatment even though you feel ill unless your doctor tells you to stop. Call your doctor or health care professional for advice if you get a fever, chills or sore throat, or other symptoms of a cold or flu. Do not treat yourself. This drug decreases your body's ability to fight infections. Try to avoid being around people who are sick. This medicine may increase your risk to bruise or bleed. Call your doctor or  health care professional if you notice any unusual bleeding. Be careful brushing and flossing your teeth or using a toothpick because you may get an infection or bleed more easily. If you have any dental work done, tell your dentist you are receiving this medicine. Avoid taking products that contain aspirin, acetaminophen, ibuprofen, naproxen, or ketoprofen unless instructed by your doctor. These medicines may hide a fever. Do not become pregnant while taking this medicine. Women should inform their doctor if they wish to become pregnant or think they might be pregnant. There is a potential for serious side effects to an unborn child. Talk to your health care professional or pharmacist for more information. Do not breast-feed an infant while taking this medicine. Men are advised not to father a child while receiving this medicine. This product may contain alcohol. Ask your pharmacist or healthcare provider if this medicine contains alcohol. Be sure to tell all healthcare providers you are taking this medicine. Certain medicines, like metronidazole and disulfiram, can cause an unpleasant reaction when taken with alcohol. The reaction includes flushing, headache, nausea, vomiting, sweating, and increased thirst. The reaction can last from 30 minutes to several hours. What side effects may I notice from receiving this medication? Side effects that you should report to your doctor or health care professional as soon as possible: allergic reactions like skin rash, itching or hives, swelling of the face, lips, or tongue breathing problems changes in vision fast, irregular heartbeat high or low blood pressure mouth sores pain, tingling, numbness in the hands or feet signs of decreased platelets or bleeding - bruising, pinpoint red spots on the skin, black, tarry stools, blood in the urine signs of decreased red blood cells - unusually weak or tired, feeling faint or lightheaded, falls signs of infection -  fever or chills, cough, sore throat, pain or difficulty passing urine signs and symptoms of liver injury like dark yellow or brown urine; general ill feeling or flu-like symptoms; light-colored stools; loss of appetite; nausea; right upper belly pain; unusually weak or tired; yellowing of the eyes or skin swelling of the ankles, feet, hands unusually slow heartbeat Side effects that usually do not require medical attention (report to your doctor or health care professional if they continue or are bothersome): diarrhea hair loss loss of appetite muscle or joint pain nausea, vomiting pain, redness, or irritation at site where injected tiredness This list may not describe all possible side effects. Call your doctor for medical advice about side effects. You may report side effects to FDA at 1-800-FDA-1088. Where should I keep my medication? This drug is given in a hospital or clinic and will not be stored at home. NOTE: This sheet is a summary. It may not cover all possible information. If you have questions about this medicine, talk to your doctor, pharmacist, or health care provider.  2022 Elsevier/Gold Standard (2020-12-25 00:00:00)  Carboplatin injection What is this  medication? CARBOPLATIN (KAR boe pla tin) is a chemotherapy drug. It targets fast dividing cells, like cancer cells, and causes these cells to die. This medicine is used to treat ovarian cancer and many other cancers. This medicine may be used for other purposes; ask your health care provider or pharmacist if you have questions. COMMON BRAND NAME(S): Paraplatin What should I tell my care team before I take this medication? They need to know if you have any of these conditions: blood disorders hearing problems kidney disease recent or ongoing radiation therapy an unusual or allergic reaction to carboplatin, cisplatin, other chemotherapy, other medicines, foods, dyes, or preservatives pregnant or trying to get  pregnant breast-feeding How should I use this medication? This drug is usually given as an infusion into a vein. It is administered in a hospital or clinic by a specially trained health care professional. Talk to your pediatrician regarding the use of this medicine in children. Special care may be needed. Overdosage: If you think you have taken too much of this medicine contact a poison control center or emergency room at once. NOTE: This medicine is only for you. Do not share this medicine with others. What if I miss a dose? It is important not to miss a dose. Call your doctor or health care professional if you are unable to keep an appointment. What may interact with this medication? medicines for seizures medicines to increase blood counts like filgrastim, pegfilgrastim, sargramostim some antibiotics like amikacin, gentamicin, neomycin, streptomycin, tobramycin vaccines Talk to your doctor or health care professional before taking any of these medicines: acetaminophen aspirin ibuprofen ketoprofen naproxen This list may not describe all possible interactions. Give your health care provider a list of all the medicines, herbs, non-prescription drugs, or dietary supplements you use. Also tell them if you smoke, drink alcohol, or use illegal drugs. Some items may interact with your medicine. What should I watch for while using this medication? Your condition will be monitored carefully while you are receiving this medicine. You will need important blood work done while you are taking this medicine. This drug may make you feel generally unwell. This is not uncommon, as chemotherapy can affect healthy cells as well as cancer cells. Report any side effects. Continue your course of treatment even though you feel ill unless your doctor tells you to stop. In some cases, you may be given additional medicines to help with side effects. Follow all directions for their use. Call your doctor or health  care professional for advice if you get a fever, chills or sore throat, or other symptoms of a cold or flu. Do not treat yourself. This drug decreases your body's ability to fight infections. Try to avoid being around people who are sick. This medicine may increase your risk to bruise or bleed. Call your doctor or health care professional if you notice any unusual bleeding. Be careful brushing and flossing your teeth or using a toothpick because you may get an infection or bleed more easily. If you have any dental work done, tell your dentist you are receiving this medicine. Avoid taking products that contain aspirin, acetaminophen, ibuprofen, naproxen, or ketoprofen unless instructed by your doctor. These medicines may hide a fever. Do not become pregnant while taking this medicine. Women should inform their doctor if they wish to become pregnant or think they might be pregnant. There is a potential for serious side effects to an unborn child. Talk to your health care professional or pharmacist for more information. Do  not breast-feed an infant while taking this medicine. What side effects may I notice from receiving this medication? Side effects that you should report to your doctor or health care professional as soon as possible: allergic reactions like skin rash, itching or hives, swelling of the face, lips, or tongue signs of infection - fever or chills, cough, sore throat, pain or difficulty passing urine signs of decreased platelets or bleeding - bruising, pinpoint red spots on the skin, black, tarry stools, nosebleeds signs of decreased red blood cells - unusually weak or tired, fainting spells, lightheadedness breathing problems changes in hearing changes in vision chest pain high blood pressure low blood counts - This drug may decrease the number of white blood cells, red blood cells and platelets. You may be at increased risk for infections and bleeding. nausea and vomiting pain,  swelling, redness or irritation at the injection site pain, tingling, numbness in the hands or feet problems with balance, talking, walking trouble passing urine or change in the amount of urine Side effects that usually do not require medical attention (report to your doctor or health care professional if they continue or are bothersome): hair loss loss of appetite metallic taste in the mouth or changes in taste This list may not describe all possible side effects. Call your doctor for medical advice about side effects. You may report side effects to FDA at 1-800-FDA-1088. Where should I keep my medication? This drug is given in a hospital or clinic and will not be stored at home. NOTE: This sheet is a summary. It may not cover all possible information. If you have questions about this medicine, talk to your doctor, pharmacist, or health care provider.  2022 Elsevier/Gold Standard (2007-09-15 00:00:00)

## 2021-02-27 NOTE — Progress Notes (Signed)
Notified by therapists on L1 that patient had a substantial wound to his back from repeated application/removal of Icy-Hot patches. Therapists asked I come over to [facility] dressing room and assess site.   Met patient in dressing room and removed gauze bandage applied from home. Observed a large but superficial abrasion to patients mid-lower back. Patient denied any pain on palpation and I observed very little clear/yellow drainage on old gauze dressing. Advised patient to keep area covered with Neosporin and non-adherent pad while sleeping, and to try and let air get to the site a few times during the day to prevent maceration.   Applied thin layer of triple antibiotic ointment and non-adherent pad to abrasion, and secured with mesh briefs to avoid additional adhesives from tape. Provided patient with above supplies and encouraged him to call me directly should he or his wife have any other questions/concerns. Patient verbalized understanding and appreciation. No other needs identified at this time.   Patient assisted out of department via wheelchair by transporter Scott without incident

## 2021-03-01 ENCOUNTER — Other Ambulatory Visit: Payer: Self-pay

## 2021-03-01 ENCOUNTER — Inpatient Hospital Stay: Payer: Medicare Other

## 2021-03-01 VITALS — BP 118/62

## 2021-03-01 DIAGNOSIS — Z5112 Encounter for antineoplastic immunotherapy: Secondary | ICD-10-CM | POA: Diagnosis not present

## 2021-03-01 DIAGNOSIS — C7951 Secondary malignant neoplasm of bone: Secondary | ICD-10-CM | POA: Diagnosis not present

## 2021-03-01 DIAGNOSIS — C801 Malignant (primary) neoplasm, unspecified: Secondary | ICD-10-CM | POA: Diagnosis not present

## 2021-03-01 DIAGNOSIS — C7800 Secondary malignant neoplasm of unspecified lung: Secondary | ICD-10-CM | POA: Diagnosis not present

## 2021-03-01 DIAGNOSIS — C3490 Malignant neoplasm of unspecified part of unspecified bronchus or lung: Secondary | ICD-10-CM

## 2021-03-01 DIAGNOSIS — Z5111 Encounter for antineoplastic chemotherapy: Secondary | ICD-10-CM | POA: Diagnosis not present

## 2021-03-01 DIAGNOSIS — Z510 Encounter for antineoplastic radiation therapy: Secondary | ICD-10-CM | POA: Diagnosis not present

## 2021-03-01 MED ORDER — PEGFILGRASTIM-CBQV 6 MG/0.6ML ~~LOC~~ SOSY
6.0000 mg | PREFILLED_SYRINGE | Freq: Once | SUBCUTANEOUS | Status: AC
  Administered 2021-03-01: 6 mg via SUBCUTANEOUS
  Filled 2021-03-01: qty 0.6

## 2021-03-01 NOTE — Patient Instructions (Signed)

## 2021-03-04 ENCOUNTER — Other Ambulatory Visit: Payer: Self-pay

## 2021-03-04 ENCOUNTER — Ambulatory Visit (HOSPITAL_COMMUNITY)
Admission: RE | Admit: 2021-03-04 | Discharge: 2021-03-04 | Disposition: A | Discharge status: Home / Self Care | Payer: Medicare Other | Source: Ambulatory Visit | Attending: Physician Assistant | Admitting: Physician Assistant

## 2021-03-04 DIAGNOSIS — S2222XA Fracture of body of sternum, initial encounter for closed fracture: Secondary | ICD-10-CM | POA: Diagnosis not present

## 2021-03-04 DIAGNOSIS — D7389 Other diseases of spleen: Secondary | ICD-10-CM | POA: Diagnosis not present

## 2021-03-04 DIAGNOSIS — C7800 Secondary malignant neoplasm of unspecified lung: Secondary | ICD-10-CM | POA: Insufficient documentation

## 2021-03-04 DIAGNOSIS — C787 Secondary malignant neoplasm of liver and intrahepatic bile duct: Secondary | ICD-10-CM | POA: Insufficient documentation

## 2021-03-04 DIAGNOSIS — I2510 Atherosclerotic heart disease of native coronary artery without angina pectoris: Secondary | ICD-10-CM | POA: Diagnosis not present

## 2021-03-04 DIAGNOSIS — C7951 Secondary malignant neoplasm of bone: Secondary | ICD-10-CM | POA: Insufficient documentation

## 2021-03-04 DIAGNOSIS — C7889 Secondary malignant neoplasm of other digestive organs: Secondary | ICD-10-CM | POA: Insufficient documentation

## 2021-03-04 DIAGNOSIS — C7971 Secondary malignant neoplasm of right adrenal gland: Secondary | ICD-10-CM | POA: Insufficient documentation

## 2021-03-04 DIAGNOSIS — C3490 Malignant neoplasm of unspecified part of unspecified bronchus or lung: Secondary | ICD-10-CM | POA: Insufficient documentation

## 2021-03-04 LAB — GLUCOSE, CAPILLARY: Glucose-Capillary: 136 mg/dL — ABNORMAL HIGH (ref 70–99)

## 2021-03-04 IMAGING — CT NM PET TUM IMG RESTAG (PS) SKULL BASE T - THIGH
1 of 8 series · 1 of 25 positions shown · non-contrast
Comparison: CTs of the neck, chest, abdomen and pelvis [DATE].
Abdominal MRI [DATE]. Outside chest CT [DATE] from [facility].

CLINICAL DATA: Initial treatment strategy for non-small cell lung
cancer. Metastatic squamous cell carcinoma with history head and
neck cancer.

EXAM:
NUCLEAR MEDICINE PET SKULL BASE TO THIGH
TECHNIQUE: 10.21 mCi F-18 FDG was injected intravenously. Full-ring PET imaging
was performed from the skull base to thigh after the radiotracer. CT
data was obtained and used for attenuation correction and anatomic
localization.
Fasting blood glucose: 136 mg/dl

[Series 3: pet sk_thigh ac · axial · 5.0mm · 4.07mm/px · 1 of 239 slices shown]
[im 120/239]
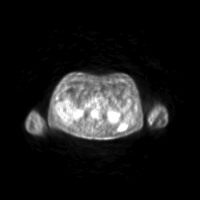

[1 of 25 positions shown; findings below may reference images not displayed]

FINDINGS: Mediastinal blood pool activity: SUV max

NECK:

There is a small hypermetabolic nodule in the left suboccipital
region which has an SUV max of 4.6. No other hypermetabolic cervical
lymph nodes are identified.There are no lesions of the pharyngeal
mucosal space.

Incidental CT findings: Heterogeneity of the thyroid gland without
focal nodule or hypermetabolic activity. Bilateral carotid
atherosclerosis.

CHEST:

The dominant centrally necrotic AP window dental mass measures 2.7 x
3.1 cm on image 62/4 and demonstrates peripheral hypermetabolic
activity with an SUV max of 6.7. There is hypermetabolic inferior
left hilar adenopathy (SUV max 4.6). An 11 mm right axillary node on
image 63/4 is hypermetabolic (SUV max 3.6). There is focal
hypermetabolic activity in the region of the left atrial appendage
(SUV max 5.3), without clear pericardial nodule on recent CT. There
are new hypermetabolic biapical airspace opacities compared with the
CT of [DATE]. These demonstrate an SUV max of 6.0 on the left.
Several hypermetabolic pulmonary nodules are present bilaterally,
including a 1.0 cm left upper lobe nodule on image 32/8 (SUV max
2.7) and a 1.3 cm right lower lobe nodule on image 43/8 (SUV max
3.2). There is focal hypermetabolic activity at the gastroesophageal
junction (SUV max 4.8), without corresponding focal CT abnormality.

Incidental CT findings: Small right-greater-than-left pleural
effusions without associated focal hypermetabolic activity. Sequela
of prior granulomatous disease with partially calcified right hilar
and subcarinal lymph nodes, without significant hypermetabolic
activity. Atherosclerosis of the aorta, great vessels and coronary
arteries.

ABDOMEN/PELVIS:

As seen on recent CT and MRI, there are multiple hypodense hepatic
lesions which are new compared with the chest CT of [DATE]. Some
of these have enlarged from the prior CT, including one measuring
4.7 x 4.3 cm in the right hepatic lobe on image 95/4 (previously
x 2.8 cm) and one in the left lobe measuring 6.6 x 5.6 cm on image
95/4 (previously 3.8 cm). This lesion demonstrates peripheral
hypermetabolic activity with an SUV max of 5.6. Many of the other
lesions are devoid of central metabolic activity, consistent with
necrosis. There is a hypermetabolic lesion at the splenic hilum
measuring 2.6 cm on image 113/4 (SUV max 6.6). The previously
identified small right adrenal nodule demonstrates mild
hypermetabolic activity (SUV max 4.0). No hypermetabolic
abdominopelvic lymph nodes identified. However, there is focal
hypermetabolic activity superior to the bladder which appears to be
associated with small bowel (SUV max 9.6). There is no obvious focal
lesion in this area on the CT images, and no evidence of bowel
obstruction.

Incidental CT findings: Sequela of prior granulomatous disease with
multiple calcified splenic granulomas. Increased free fluid in the
pelvis without hypermetabolic activity. Mild aortic and branch
vessel atherosclerosis.

SKELETON:

Numerous hypermetabolic osseous metastases are present, involving
multiple ribs bilaterally, the sternum, proximal right humerus, the
left scapula, multiple spinal segments, the bony pelvis and proximal
left femur. Many of these lesions are expansile with impending
pathologic fractures. There is a pathologic fracture of the mid
sternal body. In addition, there are scattered hypermetabolic soft
tissue nodules, including one superficial to right gluteus maximus
muscle inferiorly (SUV max 4.8).

Incidental CT findings: Soft tissue extravasation in the left
forearm attributed to the injection. Multilevel spondylosis.
IMPRESSION: 1. In correlation with previous imaging, there is widespread
metastatic disease to the lungs, bones, liver, spleen, right adrenal
gland, several lymph nodes and additional soft tissue nodules. The
thoracic adenopathy and hepatic metastatic disease demonstrate
significant central necrosis which may relate to interval therapy.
2. New biapical airspace opacities compared with recent prior
studies, with associated hypermetabolic activity, likely
inflammatory/infectious. If recent radiation therapy to this area,
that could be causative.
3. Indeterminate focal activity superior to the bladder, appearing
to involve the small bowel, likely a metastasis. No evidence of
bowel obstruction or perforation.

## 2021-03-04 MED ORDER — FLUDEOXYGLUCOSE F - 18 (FDG) INJECTION
10.2100 | Freq: Once | INTRAVENOUS | Status: AC | PRN
  Administered 2021-03-04: 10.21 via INTRAVENOUS

## 2021-03-05 ENCOUNTER — Telehealth: Payer: Self-pay | Admitting: Medical Oncology

## 2021-03-05 NOTE — Telephone Encounter (Signed)
Sore throat . Wife reports a small blister inside his mouth on the Left side. No white patches or white tongue.  Per Cassie ,I instructed wife to have pt gargle with warm salt water rinses and to get a COVID test.  F/U-Call back tomorrow with an update. Wife understands.

## 2021-03-07 ENCOUNTER — Encounter (HOSPITAL_COMMUNITY): Payer: Self-pay

## 2021-03-07 ENCOUNTER — Ambulatory Visit (HOSPITAL_COMMUNITY): Payer: Medicare Other

## 2021-03-08 ENCOUNTER — Ambulatory Visit: Payer: Self-pay | Admitting: Radiation Oncology

## 2021-03-17 NOTE — Progress Notes (Signed)
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 He had metastatic disease in October 2022 with metastatic disease involving the lung as well as mediastinal lymphadenopathy and multiple suspicious liver lesion as well as multiple osseous metastasis and right adrenal suspicious metastatic disease.   PDL1 Expression: 30%  PRIOR THERAPY: 1) surgical resection in October 2019 under the care of Dr. Vicie Mutters for the left buccal squamous cel carcinoma and reconstructed local mucosal flaps. 2) May 2022 status post surgical resection with bilateral neck dissection as well as adjuvant radiotherapy under the care of Dr. Isidore Moos.  3) palliative radiation under the care of Dr. Isidore Moos to the spine, scapula, and ribs.  Last treatment expected on 02/27/21.   CURRENT THERAPY:  1) Palliative systemic chemotherapy carboplatin AUC of 5, paclitaxel 175 mg per metered square, Keytruda 200 mg IV every 3 weeks with Neulasta support.  First dose expected on 02/25/2021.  INTERVAL HISTORY: Robert [name] 80 y.o. male returns to the clinic today for a follow up visit accompanied by his wife. The patient was recently found to have recurrent/ metastatic squamous cell carcinoma after he presented to the hospital with low back pain and lower extremity weakness. He completed palliative radiation to the scapula, spine, and ribs under the care of Dr. Isidore Moos. He completed this on 02/27/21.   3 weeks ago, he underwent his first cycle of palliative systemic chemotherapy and tolerated it fair except today he has chemotherapy induced anemia. He has associated  shortness of breath, weakness, and pallor. He denies any known bleeding. He denies fevers, chills, or night sweats. He has been having some ongoing issues related to weight loss for which he is scheduled to meet with a member of the nutritionist team while in the infusion room today. His wife is concerned with his lack of appetite and weight loss. He is able to swallow solids and liquid. He has been having some throat soreness the last 10 days. No sick contacts or nasal congestion. He drinks supplemental drinks. He saw Dr. Benson Norway in June from dentistry but has not seen since. He is having a hard time closing his mouth since his surgery and his wife states his mouth is open all of the time. The surgical team at [facility] said they had a dental team according to his wife, but he has not been established with them. Sounds like they would be interested in this.  He states his breathing is "shallow".  He denies any significant cough and states except sometimes in which he has phlegm.  He does not need to take anything for cough. Denies any nausea, vomiting, or diarrhea.  He sometimes has constipation which he is going to try taking half of MiraLAX.  He is not presently taking colace listed on his medication list. Denies any headache or visual changes.  A few weeks ago in radiation oncology, he was found to have a significant skin wound on his back from the icy hot patches. He has been applying ointment and doing dressing changes and this area has improved. He is here for evaluation and repeat blood work before starting cycle #2.   MEDICAL HISTORY: Past Medical History:  Diagnosis  Date   A-fib ([place])    HTN (hypertension)     ALLERGIES:  has No Known Allergies.  MEDICATIONS:  Current Outpatient Medications  Medication Sig Dispense Refill   potassium chloride 20 MEQ/15ML (10%) SOLN Take 15 mLs (20 mEq total) by mouth 2 (two) times daily. 150 mL 0   acetaminophen (TYLENOL) 160 MG/5ML suspension Take 30 mLs (960 mg  total) by mouth every 6 (six) hours as needed for moderate pain. 1000 mL 0   docusate sodium (COLACE) 100 MG capsule Take 100 mg by mouth 2 (two) times daily as needed for mild constipation.     furosemide (LASIX) 20 MG tablet Take 1 tablet (20 mg total) by mouth daily. 30 tablet 0   metoprolol tartrate (LOPRESSOR) 100 MG tablet Take 100 mg by mouth 2 (two) times daily.     prochlorperazine (COMPAZINE) 10 MG tablet Take 1 tablet (10 mg total) by mouth every 6 (six) hours as needed. 30 tablet 2   rivaroxaban (XARELTO) 20 MG TABS tablet Take 1 tablet (20 mg total) by mouth daily with supper. 90 tablet 3   No current facility-administered medications for this visit.   Facility-Administered Medications Ordered in Other Visits  Medication Dose Route Frequency Provider Last Rate Last Admin   0.9 %  sodium chloride infusion   Intravenous Once Curt Bears, MD       CARBOplatin (PARAPLATIN) 530 mg in sodium chloride 0.9 % 250 mL chemo infusion  530 mg Intravenous Once Curt Bears, MD       dexamethasone (DECADRON) 10 mg in sodium chloride 0.9 % 50 mL IVPB  10 mg Intravenous Once Curt Bears, MD       diphenhydrAMINE (BENADRYL) injection 50 mg  50 mg Intravenous Once Curt Bears, MD       famotidine (PEPCID) IVPB 20 mg in NS 100 mL IVPB  20 mg Intravenous Once Curt Bears, MD       fosaprepitant (EMEND) 150 mg in sodium chloride 0.9 % 145 mL IVPB  150 mg Intravenous Once Curt Bears, MD       PACLitaxel (TAXOL) 378 mg in sodium chloride 0.9 % 500 mL chemo infusion (> 93m/m2)  175 mg/m2 (Treatment Plan Recorded) Intravenous Once MCurt Bears MD       palonosetron (ALOXI) injection 0.25 mg  0.25 mg Intravenous Once MCurt Bears MD       pembrolizumab ([facility] 200 mg in sodium chloride 0.9 % 50 mL chemo infusion  200 mg Intravenous Once MCurt Bears MD        SURGICAL HISTORY:  Past Surgical History:  Procedure Laterality Date   DENTAL SURGERY       REVIEW OF SYSTEMS:   Constitutional: Positive for weight loss, appetite change, and fatigue.  Negative for chills and fever. HENT: Positive for a difficult time swallowing large pills.  Negative for mouth sores, or nosebleeds.  Positive for sore throat. Eyes: Negative for eye problems and icterus.  Respiratory: Positive for shortness of breath.  Negative for any significant cough  hemoptysis, and wheezing.   Cardiovascular: Negative for chest  pain.  Positive for bilateral lower extremity edema in the ankles. Gastrointestinal: Positive for occasional constipation.  Negative for abdominal pain, diarrhea, nausea and vomiting.  Genitourinary: Negative for bladder incontinence, difficulty urinating, dysuria, frequency and hematuria.   Musculoskeletal:  Negative for gait problem, neck pain and neck stiffness.  Skin: Negative for itching and rash.  Well-healed wound on right forearm from prior surgery. Neurological: Negative for  dizziness, extremity weakness, gait problem, headaches, light-headedness and seizures.  Hematological: Negative for adenopathy. Does not bruise/bleed easily.  Psychiatric/Behavioral: Negative for confusion, depression and sleep disturbance. The patient is not nervous/anxious.      PHYSICAL EXAMINATION:  Blood pressure (!) 118/99, pulse 83, temperature (!) 97.2 F (36.2 C), temperature source Tympanic, resp. rate 18, weight 187 lb (84.8 kg), SpO2 96 %.  ECOG PERFORMANCE STATUS: 2-3  Physical Exam  Constitutional: Oriented to person, place, and time and and appearing male, and in no distress.  HENT:  Head: Normocephalic and atraumatic.  Mouth/Throat: Oropharynx is clear and moist. No oropharyngeal exudate. Prior neck and oral surgery scaring.  Tongue is dry.  Upper dentures noted.  Eyes: Conjunctivae are normal. Right eye exhibits no discharge. Left eye exhibits no discharge. No scleral icterus.  Neck: Normal range of motion. Neck supple.  Cardiovascular: Normal  rate, irregular rhythm, normal heart sounds and intact distal pulses.   Pulmonary/Chest: Effort normal and breath sounds normal. No respiratory distress. No wheezes. No rales.  Abdominal: Soft. Bowel sounds are normal. Exhibits no distension and no mass. There is no tenderness.  Musculoskeletal: Normal range of motion.  Mild pitting edema in the ankles bilaterally. Lymphadenopathy:    No cervical adenopathy.  Neurological: Alert and oriented to person, place, and time. Exhibits normal muscle tone.  Examined in the wheelchair. Skin: Skin is warm and dry. No rash noted. Not diaphoretic. No erythema. No pallor.  Well-healed scar along right forearm. Psychiatric: Mood, memory and judgment normal.  Vitals reviewed.    LABORATORY DATA: Lab Results  Component Value Date   WBC 5.1 03/18/2021   HGB 8.1 (L) 03/18/2021   HCT 24.9 (L) 03/18/2021   MCV 84.1 03/18/2021   PLT 167 03/18/2021      Chemistry      Component Value Date/Time   NA 132 (L) 03/18/2021 0900   K 2.8 (L) 03/18/2021 0900   CL 97 (L) 03/18/2021 0900   CO2 25 03/18/2021 0900   BUN 13 03/18/2021 0900   CREATININE 0.67 03/18/2021 0900      Component Value Date/Time   CALCIUM 7.9 (L) 03/18/2021 0900   ALKPHOS 104 03/18/2021 0900   AST 36 03/18/2021 0900   ALT 36 03/18/2021 0900   BILITOT 1.4 (H) 03/18/2021 0900       RADIOGRAPHIC STUDIES:  NM PET Image Restag (PS) Skull Base To Thigh  Result Date: 03/05/2021 CLINICAL DATA:  Initial treatment strategy for non-small cell lung cancer. Metastatic squamous cell carcinoma with history head and neck cancer. EXAM: NUCLEAR MEDICINE PET SKULL BASE TO THIGH TECHNIQUE: 10.21 mCi F-18 FDG was injected intravenously. Full-ring PET imaging was performed from the skull base to thigh after the radiotracer. CT data was obtained and used for attenuation correction and anatomic localization. Fasting blood glucose: 136 mg/dl COMPARISON:  CTs of the neck, chest, abdomen and pelvis  02/09/2021. Abdominal MRI 02/11/2021. Outside chest CT 08/15/2020 from [facility]. FINDINGS: Mediastinal blood pool activity: SUV max 2.0 NECK: There is a small hypermetabolic nodule in the left suboccipital region which has an SUV max of 4.6. No other hypermetabolic cervical lymph nodes are identified.There are no lesions of the pharyngeal mucosal space. Incidental CT findings: Heterogeneity of the thyroid gland without focal nodule or hypermetabolic activity. Bilateral carotid atherosclerosis. CHEST: The dominant centrally necrotic AP window dental mass measures 2.7 x 3.1 cm on image 62/4 and demonstrates peripheral hypermetabolic activity with an SUV max of 6.7. There is hypermetabolic inferior  left hilar adenopathy (SUV max 4.6). An 11 mm right axillary node on image 70/7 is hypermetabolic (SUV max 3.6). There is focal hypermetabolic activity in the region of the left atrial appendage (SUV max 5.3), without clear pericardial nodule on recent CT. There are new hypermetabolic biapical airspace opacities compared with the CT of 02/09/2021. These demonstrate an SUV max of 6.0 on the left. Several hypermetabolic pulmonary nodules are present bilaterally, including a 1.0 cm left upper lobe nodule on image 32/8 (SUV max 2.7) and a 1.3 cm right lower lobe nodule on image 43/8 (SUV max 3.2). There is focal hypermetabolic activity at the gastroesophageal junction (SUV max 4.8), without corresponding focal CT abnormality. Incidental CT findings: Small right-greater-than-left pleural effusions without associated focal hypermetabolic activity. Sequela of prior granulomatous disease with partially calcified right hilar and subcarinal lymph nodes, without significant hypermetabolic activity. Atherosclerosis of the aorta, great vessels and coronary arteries. ABDOMEN/PELVIS: As seen on recent CT and MRI, there are multiple hypodense hepatic lesions which are new compared with the chest CT of 08/15/2020. Some of these  have enlarged from the prior CT, including one measuring 4.7 x 4.3 cm in the right hepatic lobe on image 95/4 (previously 3.3 x 2.8 cm) and one in the left lobe measuring 6.6 x 5.6 cm on image 95/4 (previously 3.8 cm). This lesion demonstrates peripheral hypermetabolic activity with an SUV max of 5.6. Many of the other lesions are devoid of central metabolic activity, consistent with necrosis. There is a hypermetabolic lesion at the splenic hilum measuring 2.6 cm on image 113/4 (SUV max 6.6). The previously identified small right adrenal nodule demonstrates mild hypermetabolic activity (SUV max 4.0). No hypermetabolic abdominopelvic lymph nodes identified. However, there is focal hypermetabolic activity superior to the bladder which appears to be associated with small bowel (SUV max 9.6). There is no obvious focal lesion in this area on the CT images, and no evidence of bowel obstruction. Incidental CT findings: Sequela of prior granulomatous disease with multiple calcified splenic granulomas. Increased free fluid in the pelvis without hypermetabolic activity. Mild aortic and branch vessel atherosclerosis. SKELETON: Numerous hypermetabolic osseous metastases are present, involving multiple ribs bilaterally, the sternum, proximal right humerus, the left scapula, multiple spinal segments, the bony pelvis and proximal left femur. Many of these lesions are expansile with impending pathologic fractures. There is a pathologic fracture of the mid sternal body. In addition, there are scattered hypermetabolic soft tissue nodules, including one superficial to right gluteus maximus muscle inferiorly (SUV max 4.8). Incidental CT findings: Soft tissue extravasation in the left forearm attributed to the injection. Multilevel spondylosis. IMPRESSION: 1. In correlation with previous imaging, there is widespread metastatic disease to the lungs, bones, liver, spleen, right adrenal gland, several lymph nodes and additional soft tissue  nodules. The thoracic adenopathy and hepatic metastatic disease demonstrate significant central necrosis which may relate to interval therapy. 2. New biapical airspace opacities compared with recent prior studies, with associated hypermetabolic activity, likely inflammatory/infectious. If recent radiation therapy to this area, that could be causative. 3. Indeterminate focal activity superior to the bladder, appearing to involve the small bowel, likely a metastasis. No evidence of bowel obstruction or perforation. Electronically Signed   By: Richardean Sale M.D.   On: 03/05/2021 13:12     ASSESSMENT/PLAN:  This is a very pleasant 80 year old Caucasian male with metastatic squamous cell carcinoma of questionable lung cancer primary or head and neck.  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 Isidore Moos The patient presented today with evidence of widespread metastatic disease to the lungs, bones, liver, spleen, right adrenal gland, several lymph nodes and additional soft tissue nodules. The thoracic adenopathy and hepatic metastatic disease. He also had an area of focal activity superior to the bladder, appearing to involve the small bowel, likely a metastasis which was found in October 2022. PDL1 expression 30%.   He also underwent a biopsy of the right sixth rib bone lesion which was consistent with recurrent poorly differentiated squamous cell carcinoma.  He is currently undergoing treatment with palliative systemic chemotherapy with carboplatin for AUC of 5, paclitaxel 175 mg per metered squared and Keytruda 200 mg IV every 3 weeks with Neulasta support.  First dose on 02/25/21. Status post 1 cycle.   The patient was seen with Dr. Julien Nordmann  today who personally and independently reviewed the patient's PET scan and discussed it with the patient. Labs were reviewed.  Dr. Julien Nordmann once again reiterated that the patient's prognosis is poor.  Without treatment, Dr. Julien Nordmann estimates he would have less than 3 months.  With treatment, the patient may have an average of 1 year.  Dr. Julien Nordmann reviewed that his options are to proceed with palliative care/hospice versus proceeding with treatment.  The patient is wife are interested in proceeding with treatment.  Dr. Julien Nordmann recommends that he proceed with cycle #2 today as scheduled.   The patient's labs today show hypokalemia.  We will arrange for the patient to receive 40 mill equivalents of IV potassium per Dr. Julien Nordmann.  We will also send him with liquid potassium 20 mEq twice daily for 5 days.  Patient is unable to tolerate large pills.  The patient is also symptomatic from his worsening anemia with cold intolerance, weakness, and worsening dyspnea on exertion.  We will arrange for him to receive 2 units of blood.  We will have to break up the supportive measures over the next couple days.  Dr. Julien Nordmann recommend that he use Biotene for his sore mouth.  He also is established with Dr. Benson Norway from dentistry.  Per their last note from June 2022, he was supposed to follow-up after completing radiation.  I gave the patient's wife the phone number to the clinic.  I also encouraged her to reach out to the [facility] team at [facility] to see if they have any recommendations/noninvasive recommendations regarding his dental concerns following his head and neck surgery.  For his weight loss, he is scheduled to meet with the member the nutritionist team while in the infusion room today.  I encouraged him to eat soft foods such as yogurt, mashed potatoes, supplemental drinks, etc.  We will not arrange for any PEG tube placement at this time but if the patient continues to lose weight we may revisit this in the future.  We  will see him back for a follow up visit in 3 weeks for evaluation before starting cycle #3.   The patient was advised to call immediately if he has any concerning symptoms in the interval. The patient voices understanding of current disease status and treatment options and is in agreement with the current care plan. All questions were answered. The patient knows to call the clinic with any problems, questions or concerns. We can  certainly see the patient much sooner if necessary    Orders Placed This Encounter  Procedures   ABO/RH    Standing Status:   Future    Number of Occurrences:   1    Standing Expiration Date:   03/18/2022      Tobe Sos Julien Berryman, PA-C 03/18/21  ADDENDUM: Hematology/Oncology Attending: I had a face-to-face encounter with the patient today.  I reviewed his record, labs and recent scan and recommended his care plan.  This is a very pleasant 80 years old white male with metastatic squamous cell carcinoma questionable to be secondary to primary head and neck cancer versus new non-small cell lung cancer diagnosed in October 2022.  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  He has PD-L1 expression of 30%. The patient had a PET scan performed recently.  I personally and independently reviewed the scan images and discussed the results with the patient and his wife.  The patient has widely metastatic disease involving the lung, liver, bone as well as right adrenal gland.  He started systemic chemotherapy with carboplatin for AUC of 5, paclitaxel 175 Mg/M2 and Keytruda 200 Mg IV every 3 weeks on February 25, 2021.  He tolerated the first cycle of his treatment well except for fatigue secondary to chemotherapy-induced anemia.  The patient also continues to have mouth sores and problem with his  teeth.  He was seen by Dr. Donnie Mesa with the [facility] in June 2020.  He is also scheduled to see the dietitian at the cancer center for his malnutrition. I discussed with the patient and his wife his treatment options including consideration of palliative care and hospice versus proceeding with chemotherapy as previously planned.  The patient and his wife are interested in continuing treatment. I recommended for the patient to proceed with cycle #2 of his systemic chemotherapy today. Will arrange for the patient to receive 2 units of PRBCs transfusion in the next few days. For the hypokalemia, will give the patient potassium replacement intravenously in the chemo room and we will also send prescription to his pharmacy with additional oral supplements of potassium chloride. He will come back for follow-up visit in 3 weeks for evaluation before the next cycle of his treatment. The patient was advised to call immediately if he has any other concerning symptoms in the interval. The total time spent in the appointment was 30 minutes. Disclaimer: This note was dictated with voice recognition software. Similar sounding words can inadvertently be transcribed and may be missed upon review. Eilleen Kempf, MD 03/18/21

## 2021-03-18 ENCOUNTER — Telehealth: Payer: Self-pay

## 2021-03-18 ENCOUNTER — Other Ambulatory Visit: Payer: Self-pay

## 2021-03-18 ENCOUNTER — Inpatient Hospital Stay: Payer: Medicare Other

## 2021-03-18 ENCOUNTER — Other Ambulatory Visit: Payer: Self-pay | Admitting: Physician Assistant

## 2021-03-18 ENCOUNTER — Inpatient Hospital Stay (HOSPITAL_BASED_OUTPATIENT_CLINIC_OR_DEPARTMENT_OTHER): Payer: Medicare Other | Admitting: Physician Assistant

## 2021-03-18 ENCOUNTER — Encounter: Payer: Self-pay | Admitting: Physician Assistant

## 2021-03-18 ENCOUNTER — Inpatient Hospital Stay: Payer: Medicare Other | Admitting: Nutrition

## 2021-03-18 ENCOUNTER — Encounter: Payer: Self-pay | Admitting: Internal Medicine

## 2021-03-18 VITALS — BP 118/99 | HR 83 | Temp 97.2°F | Resp 18 | Wt 187.0 lb

## 2021-03-18 DIAGNOSIS — D6481 Anemia due to antineoplastic chemotherapy: Secondary | ICD-10-CM

## 2021-03-18 DIAGNOSIS — C3490 Malignant neoplasm of unspecified part of unspecified bronchus or lung: Secondary | ICD-10-CM

## 2021-03-18 DIAGNOSIS — T451X5A Adverse effect of antineoplastic and immunosuppressive drugs, initial encounter: Secondary | ICD-10-CM | POA: Diagnosis not present

## 2021-03-18 DIAGNOSIS — R634 Abnormal weight loss: Secondary | ICD-10-CM

## 2021-03-18 DIAGNOSIS — E876 Hypokalemia: Secondary | ICD-10-CM

## 2021-03-18 DIAGNOSIS — Z5111 Encounter for antineoplastic chemotherapy: Secondary | ICD-10-CM | POA: Diagnosis not present

## 2021-03-18 DIAGNOSIS — D649 Anemia, unspecified: Secondary | ICD-10-CM

## 2021-03-18 DIAGNOSIS — Z5112 Encounter for antineoplastic immunotherapy: Secondary | ICD-10-CM | POA: Diagnosis not present

## 2021-03-18 DIAGNOSIS — Z510 Encounter for antineoplastic radiation therapy: Secondary | ICD-10-CM | POA: Diagnosis not present

## 2021-03-18 DIAGNOSIS — R5383 Other fatigue: Secondary | ICD-10-CM

## 2021-03-18 DIAGNOSIS — C801 Malignant (primary) neoplasm, unspecified: Secondary | ICD-10-CM | POA: Diagnosis not present

## 2021-03-18 DIAGNOSIS — C7951 Secondary malignant neoplasm of bone: Secondary | ICD-10-CM | POA: Diagnosis not present

## 2021-03-18 DIAGNOSIS — C7800 Secondary malignant neoplasm of unspecified lung: Secondary | ICD-10-CM | POA: Diagnosis not present

## 2021-03-18 LAB — CMP (CANCER CENTER ONLY)
ALT: 36 U/L (ref 0–44)
AST: 36 U/L (ref 15–41)
Albumin: 2.1 g/dL — ABNORMAL LOW (ref 3.5–5.0)
Alkaline Phosphatase: 104 U/L (ref 38–126)
Anion gap: 10 (ref 5–15)
BUN: 13 mg/dL (ref 8–23)
CO2: 25 mmol/L (ref 22–32)
Calcium: 7.9 mg/dL — ABNORMAL LOW (ref 8.9–10.3)
Chloride: 97 mmol/L — ABNORMAL LOW (ref 98–111)
Creatinine: 0.67 mg/dL (ref 0.61–1.24)
GFR, Estimated: >60 mL/min (ref >60)
Glucose, Bld: 120 mg/dL — ABNORMAL HIGH (ref 70–99)
Potassium: 2.8 mmol/L — ABNORMAL LOW (ref 3.5–5.1)
Sodium: 132 mmol/L — ABNORMAL LOW (ref 135–145)
Total Bilirubin: 1.4 mg/dL — ABNORMAL HIGH (ref 0.3–1.2)
Total Protein: 5.6 g/dL — ABNORMAL LOW (ref 6.5–8.1)

## 2021-03-18 LAB — CBC WITH DIFFERENTIAL (CANCER CENTER ONLY)
Abs Immature Granulocytes: 0.05 10*3/uL (ref 0.00–0.07)
Basophils Absolute: 0 10*3/uL (ref 0.0–0.1)
Basophils Relative: 0 %
Eosinophils Absolute: 0 10*3/uL (ref 0.0–0.5)
Eosinophils Relative: 0 %
HCT: 24.9 % — ABNORMAL LOW (ref 39.0–52.0)
Hemoglobin: 8.1 g/dL — ABNORMAL LOW (ref 13.0–17.0)
Immature Granulocytes: 1 %
Lymphocytes Relative: 4 %
Lymphs Abs: 0.2 10*3/uL — ABNORMAL LOW (ref 0.7–4.0)
MCH: 27.4 pg (ref 26.0–34.0)
MCHC: 32.5 g/dL (ref 30.0–36.0)
MCV: 84.1 fL (ref 80.0–100.0)
Monocytes Absolute: 0.4 10*3/uL (ref 0.1–1.0)
Monocytes Relative: 8 %
Neutro Abs: 4.4 10*3/uL (ref 1.7–7.7)
Neutrophils Relative %: 87 %
Platelet Count: 167 10*3/uL (ref 150–400)
RBC: 2.96 MIL/uL — ABNORMAL LOW (ref 4.22–5.81)
RDW: 16.9 % — ABNORMAL HIGH (ref 11.5–15.5)
WBC Count: 5.1 10*3/uL (ref 4.0–10.5)
nRBC: 0 % (ref 0.0–0.2)

## 2021-03-18 LAB — PREPARE RBC (CROSSMATCH)

## 2021-03-18 LAB — SAMPLE TO BLOOD BANK

## 2021-03-18 LAB — ABO/RH: ABO/RH(D): O NEG

## 2021-03-18 LAB — TSH: TSH: 0.752 u[IU]/mL (ref 0.320–4.118)

## 2021-03-18 MED ORDER — POTASSIUM CHLORIDE 10 MEQ/100ML IV SOLN: 10.0000 meq | INTRAVENOUS | Status: AC

## 2021-03-18 MED ORDER — SODIUM CHLORIDE 0.9 % IV SOLN
175.0000 mg/m2 | Freq: Once | INTRAVENOUS | Status: AC
  Administered 2021-03-18: 378 mg via INTRAVENOUS
  Filled 2021-03-18: qty 63

## 2021-03-18 MED ORDER — DIPHENHYDRAMINE HCL 50 MG/ML IJ SOLN
50.0000 mg | Freq: Once | INTRAMUSCULAR | Status: AC
  Administered 2021-03-18: 11:00:00 50 mg via INTRAVENOUS
  Filled 2021-03-18: qty 1

## 2021-03-18 MED ORDER — PALONOSETRON HCL INJECTION 0.25 MG/5ML
0.2500 mg | Freq: Once | INTRAVENOUS | Status: AC
  Administered 2021-03-18: 11:00:00 0.25 mg via INTRAVENOUS
  Filled 2021-03-18: qty 5

## 2021-03-18 MED ORDER — SODIUM CHLORIDE 0.9 % IV SOLN
200.0000 mg | Freq: Once | INTRAVENOUS | Status: AC
  Administered 2021-03-18: 12:00:00 200 mg via INTRAVENOUS
  Filled 2021-03-18: qty 8

## 2021-03-18 MED ORDER — FAMOTIDINE 20 MG IN NS 100 ML IVPB
20.0000 mg | Freq: Once | INTRAVENOUS | Status: AC
  Administered 2021-03-18: 12:00:00 20 mg via INTRAVENOUS
  Filled 2021-03-18: qty 100

## 2021-03-18 MED ORDER — SODIUM CHLORIDE 0.9 % IV SOLN
10.0000 mg | Freq: Once | INTRAVENOUS | Status: AC
  Administered 2021-03-18: 11:00:00 10 mg via INTRAVENOUS
  Filled 2021-03-18: qty 10

## 2021-03-18 MED ORDER — SODIUM CHLORIDE 0.9 % IV SOLN: Freq: Once | INTRAVENOUS | Status: AC

## 2021-03-18 MED ORDER — SODIUM CHLORIDE 0.9 % IV SOLN
530.0000 mg | Freq: Once | INTRAVENOUS | Status: AC
  Administered 2021-03-18: 16:00:00 530 mg via INTRAVENOUS
  Filled 2021-03-18: qty 53

## 2021-03-18 MED ORDER — POTASSIUM CHLORIDE 20 MEQ/15ML (10%) PO SOLN: 20.0000 meq | Freq: Two times a day (BID) | ORAL | 0 refills | Status: DC

## 2021-03-18 MED ORDER — SODIUM CHLORIDE 0.9 % IV SOLN
150.0000 mg | Freq: Once | INTRAVENOUS | Status: AC
  Administered 2021-03-18: 11:00:00 150 mg via INTRAVENOUS
  Filled 2021-03-18: qty 150

## 2021-03-18 NOTE — Progress Notes (Signed)
Per Cassie, PA, proceed with treatment today with anemia and run potassium with treatment.   During the flush after Keytruda infusion, it was noted that patient's IV was occluded. Patient declined burning during infusion. He reported mild tenderness upon palpation to IV site. Patient's left forearm was noted to be swollen but no redness or firmness was noted. There was some blood return noted when IV was removed but patient reported pain when flushing saline. Cold pack was applied and patient was advised to report any further discomfort.

## 2021-03-18 NOTE — Progress Notes (Signed)
Met w/ pt to introduce myself as his Arboriculturist.  Pt has 2 insurances so copay assistance isn't needed.  I informed him of the [employer], went over what it covers and gave him an expense sheet.  Pt declined the grant at this time so I gave him my card in case he changes his mind in the future.

## 2021-03-18 NOTE — Telephone Encounter (Signed)
I left a detailed message for pts wife at [phone] to advise that pt has been arranged to received his blood transfusion and IV potassium tomorrow 03/19/21 with an arrival time of 9:45am.

## 2021-03-18 NOTE — Progress Notes (Signed)
I called and spoke with Syisha at the [facility] and confirmed they have the orders needed for this pts transfusion tomorrow 03/19/21 and will move forward with getting it prepared.

## 2021-03-18 NOTE — Progress Notes (Signed)
Nutrition follow-up completed with patient during infusion.  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 He had metastatic disease in October 2022 with metastatic disease involving the lung as well as mediastinal lymphadenopathy and multiple suspicious liver lesion as well as multiple osseous metastasis and right adrenal suspicious metastatic disease.  CURRENT THERAPY:  1) Palliative systemic chemotherapy carboplatin AUC of 5, paclitaxel 175 mg per metered square, Keytruda 200 mg IV every 3 weeks with Neulasta support.  First dose expected on 02/25/2021.  Weight documented as 187 pounds November 28, decreased from 201.2 pounds November 9.  This is a 7% weight loss in less than 3 weeks which is severe.  Noted labs: Sodium 132, potassium 2.8, glucose 120, albumin 2.1, hemoglobin 8.1, hematocrit 24.9.  Patient reports he is extremely fatigued.  Expresses frustration with how little energy he has.  Verbalizes that he knows he has lost weight.  He tries to drink Ensure 1-2 cartons daily.  He continues to try to eat soft foods and drink liquids through a straw. States he has constipation and plans on taking something today.  He denies nausea and vomiting.  Nutrition diagnosis: Unintended weight loss continues.  Intervention: Try to increase Ensure Plus or equivalent 4 times daily. Explained importance of adding calories as well as protein.  Patient agreeable to drinking an Ensure plus during infusion. Provided support and encouragement.  Monitoring, evaluation, goals: Patient will work to increase calories and protein to minimize further weight loss.  Next visit: Monday, December 19 during infusion.  **Disclaimer: This note was  dictated with voice recognition software. Similar sounding words can inadvertently be transcribed and this note may contain transcription errors which may not have been corrected upon publication of note.**

## 2021-03-18 NOTE — Patient Instructions (Signed)
[facility]  Discharge Instructions: Thank you for choosing [facility] to provide your oncology and hematology care.   If you have a lab appointment with the [facility], please go directly to the [facility] and check in at the registration area.   Wear comfortable clothing and clothing appropriate for easy access to any Portacath or PICC line.   We strive to give you quality time with your provider. You may need to reschedule your appointment if you arrive late (15 or more minutes).  Arriving late affects you and other patients whose appointments are after yours.  Also, if you miss three or more appointments without notifying the office, you may be dismissed from the clinic at the provider's discretion.      For prescription refill requests, have your pharmacy contact our office and allow 72 hours for refills to be completed.    Today you received the following chemotherapy and/or immunotherapy agents    Pembrolizumab (Keytruda), Paclitaxel (Taxol), Carboplatin   To help prevent nausea and vomiting after your treatment, we encourage you to take your nausea medication as directed.  BELOW ARE SYMPTOMS THAT SHOULD BE REPORTED IMMEDIATELY: *FEVER GREATER THAN 100.4 F (38 C) OR HIGHER *CHILLS OR SWEATING *NAUSEA AND VOMITING THAT IS NOT CONTROLLED WITH YOUR NAUSEA MEDICATION *UNUSUAL SHORTNESS OF BREATH *UNUSUAL BRUISING OR BLEEDING *URINARY PROBLEMS (pain or burning when urinating, or frequent urination) *BOWEL PROBLEMS (unusual diarrhea, constipation, pain near the anus) TENDERNESS IN MOUTH AND THROAT WITH OR WITHOUT PRESENCE OF ULCERS (sore throat, sores in mouth, or a toothache) UNUSUAL RASH, SWELLING OR PAIN  UNUSUAL VAGINAL DISCHARGE OR ITCHING   Items with * indicate a potential emergency and should be followed up as soon as possible or go to the Emergency Department if any problems should occur.  Please show the CHEMOTHERAPY ALERT  CARD or IMMUNOTHERAPY ALERT CARD at check-in to the Emergency Department and triage nurse.  Should you have questions after your visit or need to cancel or reschedule your appointment, please contact [facility]  Dept: [phone]  and follow the prompts.  Office hours are 8:00 a.m. to 4:30 p.m. Monday - Friday. Please note that voicemails left after 4:00 p.m. may not be returned until the following business day.  We are closed weekends and major holidays. You have access to a nurse at all times for urgent questions. Please call the main number to the clinic Dept: [phone] and follow the prompts.   For any non-urgent questions, you may also contact your provider using MyChart. We now offer e-Visits for anyone 49 and older to request care online for non-urgent symptoms. For details visit [email or web address].   Also download the MyChart app! Go to the app store, search "MyChart", open the app, select [facility], and log in with your MyChart username and password.  Due to Covid, a mask is required upon entering the hospital/clinic. If you do not have a mask, one will be given to you upon arrival. For doctor visits, patients may have 1 support person aged 36 or older with them. For treatment visits, patients cannot have anyone with them due to current Covid guidelines and our immunocompromised population.

## 2021-03-19 ENCOUNTER — Inpatient Hospital Stay: Payer: Medicare Other

## 2021-03-19 ENCOUNTER — Other Ambulatory Visit: Payer: Self-pay | Admitting: Physician Assistant

## 2021-03-19 ENCOUNTER — Other Ambulatory Visit: Payer: Self-pay

## 2021-03-19 DIAGNOSIS — Z5111 Encounter for antineoplastic chemotherapy: Secondary | ICD-10-CM | POA: Diagnosis not present

## 2021-03-19 DIAGNOSIS — D6481 Anemia due to antineoplastic chemotherapy: Secondary | ICD-10-CM

## 2021-03-19 DIAGNOSIS — C7951 Secondary malignant neoplasm of bone: Secondary | ICD-10-CM | POA: Diagnosis not present

## 2021-03-19 DIAGNOSIS — Z5112 Encounter for antineoplastic immunotherapy: Secondary | ICD-10-CM | POA: Diagnosis not present

## 2021-03-19 DIAGNOSIS — T451X5A Adverse effect of antineoplastic and immunosuppressive drugs, initial encounter: Secondary | ICD-10-CM

## 2021-03-19 DIAGNOSIS — E876 Hypokalemia: Secondary | ICD-10-CM

## 2021-03-19 DIAGNOSIS — C801 Malignant (primary) neoplasm, unspecified: Secondary | ICD-10-CM | POA: Diagnosis not present

## 2021-03-19 DIAGNOSIS — C7800 Secondary malignant neoplasm of unspecified lung: Secondary | ICD-10-CM | POA: Diagnosis not present

## 2021-03-19 DIAGNOSIS — Z510 Encounter for antineoplastic radiation therapy: Secondary | ICD-10-CM | POA: Diagnosis not present

## 2021-03-19 MED ORDER — SODIUM CHLORIDE 0.9% IV SOLUTION: 250.0000 mL | Freq: Once | INTRAVENOUS | Status: DC

## 2021-03-19 MED ORDER — ACETAMINOPHEN 325 MG PO TABS: 650.0000 mg | ORAL_TABLET | Freq: Once | ORAL | Status: DC

## 2021-03-19 MED ORDER — POTASSIUM CHLORIDE 10 MEQ/100ML IV SOLN
10.0000 meq | INTRAVENOUS | Status: AC
  Administered 2021-03-19 (×2): 10 meq via INTRAVENOUS

## 2021-03-19 MED ORDER — DIPHENHYDRAMINE HCL 25 MG PO CAPS: 25.0000 mg | ORAL_CAPSULE | Freq: Once | ORAL | Status: DC

## 2021-03-19 MED ORDER — POTASSIUM CHLORIDE 10 MEQ/100ML IV SOLN
INTRAVENOUS | Status: AC
  Filled 2021-03-19: qty 200

## 2021-03-19 NOTE — Patient Instructions (Signed)
Potassium Acetate Injection What is this medication? POTASSIUM ACETATE (poe TASS i um ASa tate) prevents and treats low levels of potassium in your body. Potassium plays an important role in maintaining the health of your kidneys, heart, muscles, and nervous system. This medicine may be used for other purposes; ask your health care provider or pharmacist if you have questions. What should I tell my care team before I take this medication? They need to know if you have any of these conditions: Addison's disease Dehydration Diabetes Heart disease High levels of potassium in the blood Irregular heartbeat Kidney disease Liver disease Recent severe burn An unusual or allergic reaction to potassium, other medications, foods, dyes, or preservatives Pregnant or trying to get pregnant Breast-feeding How should I use this medication? This medication is for infusion into a vein. It is given in a hospital or clinic setting. Talk to your care team about the use of this medication in children. Special care may be needed. Overdosage: If you think you have taken too much of this medicine contact a poison control center or emergency room at once. NOTE: This medicine is only for you. Do not share this medicine with others. What if I miss a dose? This does not apply. What may interact with this medication? Do not take this medication with any of the following: Certain diuretics such as spironolactone, triamterene Eplerenone Sodium polystyrene sulfonate This medication may also interact with the following: Certain medications for blood pressure or heart disease like lisinopril, losartan, quinapril, valsartan Medications that lower your chance of fighting infection such as cyclosporine, tacrolimus NSAIDs, medications for pain and inflammation, like ibuprofen or naproxen Other potassium supplements Salt substitutes This list may not describe all possible interactions. Give your health care provider a  list of all the medicines, herbs, non-prescription drugs, or dietary supplements you use. Also tell them if you smoke, drink alcohol, or use illegal drugs. Some items may interact with your medicine. What should I watch for while using this medication? Your condition will be monitored carefully while you are receiving this medication. You may need blood work done while you are taking this medication. What side effects may I notice from receiving this medication? Side effects that you should report to your care team as soon as possible: Allergic reactions--skin rash, itching, hives, swelling of the face, lips, tongue, or throat High potassium level--muscle weakness, fast or irregular heartbeat Side effects that usually do not require medical attention (report these to your care team if they continue or are bothersome): Pain, redness, or irritation at injection site This list may not describe all possible side effects. Call your doctor for medical advice about side effects. You may report side effects to FDA at 1-800-FDA-1088. Where should I keep my medication? This medication is given in a hospital or clinic and will not be stored at home. NOTE: This sheet is a summary. It may not cover all possible information. If you have questions about this medicine, talk to your doctor, pharmacist, or health care provider.  2022 Elsevier/Gold Standard (2020-12-13 00:00:00)

## 2021-03-20 ENCOUNTER — Inpatient Hospital Stay: Payer: Medicare Other

## 2021-03-20 ENCOUNTER — Other Ambulatory Visit: Payer: Self-pay | Admitting: Physician Assistant

## 2021-03-20 ENCOUNTER — Other Ambulatory Visit: Payer: Self-pay

## 2021-03-20 VITALS — BP 95/61 | HR 55 | Temp 98.4°F | Resp 16

## 2021-03-20 DIAGNOSIS — Z5111 Encounter for antineoplastic chemotherapy: Secondary | ICD-10-CM | POA: Diagnosis not present

## 2021-03-20 DIAGNOSIS — T451X5A Adverse effect of antineoplastic and immunosuppressive drugs, initial encounter: Secondary | ICD-10-CM

## 2021-03-20 DIAGNOSIS — C801 Malignant (primary) neoplasm, unspecified: Secondary | ICD-10-CM | POA: Diagnosis not present

## 2021-03-20 DIAGNOSIS — Z5112 Encounter for antineoplastic immunotherapy: Secondary | ICD-10-CM | POA: Diagnosis not present

## 2021-03-20 DIAGNOSIS — D6481 Anemia due to antineoplastic chemotherapy: Secondary | ICD-10-CM

## 2021-03-20 DIAGNOSIS — Z510 Encounter for antineoplastic radiation therapy: Secondary | ICD-10-CM | POA: Diagnosis not present

## 2021-03-20 DIAGNOSIS — C3490 Malignant neoplasm of unspecified part of unspecified bronchus or lung: Secondary | ICD-10-CM

## 2021-03-20 DIAGNOSIS — C7800 Secondary malignant neoplasm of unspecified lung: Secondary | ICD-10-CM | POA: Diagnosis not present

## 2021-03-20 DIAGNOSIS — C7951 Secondary malignant neoplasm of bone: Secondary | ICD-10-CM | POA: Diagnosis not present

## 2021-03-20 LAB — BPAM RBC
Blood Product Expiration Date: 202212192359
Blood Product Expiration Date: 202212192359
Unit Type and Rh: 9500
Unit Type and Rh: 9500

## 2021-03-20 LAB — PREPARE RBC (CROSSMATCH)

## 2021-03-20 LAB — TYPE AND SCREEN
ABO/RH(D): O NEG
Antibody Screen: NEGATIVE
Unit division: 0
Unit division: 0

## 2021-03-20 MED ORDER — PEGFILGRASTIM-CBQV 6 MG/0.6ML ~~LOC~~ SOSY
6.0000 mg | PREFILLED_SYRINGE | Freq: Once | SUBCUTANEOUS | Status: AC
  Administered 2021-03-20: 6 mg via SUBCUTANEOUS
  Filled 2021-03-20: qty 0.6

## 2021-03-21 ENCOUNTER — Ambulatory Visit: Payer: Medicare Other

## 2021-03-22 ENCOUNTER — Inpatient Hospital Stay: Payer: Medicare Other | Attending: Radiation Oncology

## 2021-03-22 ENCOUNTER — Other Ambulatory Visit: Payer: Self-pay

## 2021-03-22 DIAGNOSIS — D649 Anemia, unspecified: Secondary | ICD-10-CM | POA: Insufficient documentation

## 2021-03-22 DIAGNOSIS — C7951 Secondary malignant neoplasm of bone: Secondary | ICD-10-CM | POA: Diagnosis not present

## 2021-03-22 DIAGNOSIS — C801 Malignant (primary) neoplasm, unspecified: Secondary | ICD-10-CM | POA: Insufficient documentation

## 2021-03-22 DIAGNOSIS — Z79899 Other long term (current) drug therapy: Secondary | ICD-10-CM | POA: Diagnosis not present

## 2021-03-22 DIAGNOSIS — D6481 Anemia due to antineoplastic chemotherapy: Secondary | ICD-10-CM

## 2021-03-22 DIAGNOSIS — C7800 Secondary malignant neoplasm of unspecified lung: Secondary | ICD-10-CM | POA: Insufficient documentation

## 2021-03-22 DIAGNOSIS — Z5111 Encounter for antineoplastic chemotherapy: Secondary | ICD-10-CM | POA: Diagnosis not present

## 2021-03-22 DIAGNOSIS — Z5112 Encounter for antineoplastic immunotherapy: Secondary | ICD-10-CM | POA: Diagnosis not present

## 2021-03-22 MED ORDER — ACETAMINOPHEN 325 MG PO TABS
650.0000 mg | ORAL_TABLET | Freq: Once | ORAL | Status: AC
  Administered 2021-03-22: 650 mg via ORAL
  Filled 2021-03-22: qty 2

## 2021-03-22 MED ORDER — SODIUM CHLORIDE 0.9% IV SOLUTION
250.0000 mL | Freq: Once | INTRAVENOUS | Status: AC
  Administered 2021-03-22: 250 mL via INTRAVENOUS

## 2021-03-22 MED ORDER — DIPHENHYDRAMINE HCL 25 MG PO CAPS
25.0000 mg | ORAL_CAPSULE | Freq: Once | ORAL | Status: AC
  Administered 2021-03-22: 25 mg via ORAL
  Filled 2021-03-22: qty 1

## 2021-03-22 NOTE — Patient Instructions (Signed)
Blood Transfusion, Adult A blood transfusion is a procedure in which you receive blood or a type of blood cell (blood component) through an IV. You may need a blood transfusion when your blood level is low. This may result from a bleeding disorder, illness, injury, or surgery. The blood may come from a donor. You may also be able to donate blood for yourself (autologous blood donation) before a planned surgery. The blood given in a transfusion is made up of different blood components. You may receive: Red blood cells. These carry oxygen to the cells in the body. Platelets. These help your blood to clot. Plasma. This is the liquid part of your blood. It carries proteins and other substances throughout the body. White blood cells. These help you fight infections. If you have hemophilia or another clotting disorder, you may also receive other types of blood products. Tell a health care provider about: Any blood disorders you have. Any previous reactions you have had during a blood transfusion. Any allergies you have. All medicines you are taking, including vitamins, herbs, eye drops, creams, and over-the-counter medicines. Any surgeries you have had. Any medical conditions you have, including any recent fever or cold symptoms. Whether you are pregnant or may be pregnant. What are the risks? Generally, this is a safe procedure. However, problems may occur. The most common problems include: A mild allergic reaction, such as red, swollen areas of skin (hives) and itching. Fever or chills. This may be the body's response to new blood cells received. This may occur during or up to 4 hours after the transfusion. More serious problems may include: Transfusion-associated circulatory overload (TACO), or too much fluid in the lungs. This may cause breathing problems. A serious allergic reaction, such as difficulty breathing or swelling around the face and lips. Transfusion-related acute lung injury  (TRALI), which causes breathing difficulty and low oxygen in the blood. This can occur within hours of the transfusion or several days later. Iron overload. This can happen after receiving many blood transfusions over a period of time. Infection or virus being transmitted. This is rare because donated blood is carefully tested before it is given. Hemolytic transfusion reaction. This is rare. It happens when your body's defense system (immune system)tries to attack the new blood cells. Symptoms may include fever, chills, nausea, low blood pressure, and low back or chest pain. Transfusion-associated graft-versus-host disease (TAGVHD). This is rare. It happens when donated cells attack your body's healthy tissues. What happens before the procedure? Medicines Ask your health care provider about: Changing or stopping your regular medicines. This is especially important if you are taking diabetes medicines or blood thinners. Taking medicines such as aspirin and ibuprofen. These medicines can thin your blood. Do not take these medicines unless your health care provider tells you to take them. Taking over-the-counter medicines, vitamins, herbs, and supplements. General instructions Follow instructions from your health care provider about eating and drinking restrictions. You will have a blood test to determine your blood type. This is necessary to know what kind of blood your body will accept and to match it to the donor blood. If you are going to have a planned surgery, you may be able to do an autologous blood donation. This may be done in case you need to have a transfusion. You will have your temperature, blood pressure, and pulse monitored before the transfusion. If you have had an allergic reaction to a transfusion in the past, you may be given medicine to help prevent   a reaction. This medicine may be given to you by mouth (orally) or through an IV. Set aside time for the blood transfusion. This  procedure generally takes 1-4 hours to complete. What happens during the procedure?  An IV will be inserted into one of your veins. The bag of donated blood will be attached to your IV. The blood will then enter through your vein. Your temperature, blood pressure, and pulse will be monitored regularly during the transfusion. This monitoring is done to detect early signs of a transfusion reaction. Tell your nurse right away if you have any of these symptoms during the transfusion: Shortness of breath or trouble breathing. Chest or back pain. Fever or chills. Hives or itching. If you have any signs or symptoms of a reaction, your transfusion will be stopped and you may be given medicine. When the transfusion is complete, your IV will be removed. Pressure may be applied to the IV site for a few minutes. A bandage (dressing)will be applied. The procedure may vary among health care providers and hospitals. What happens after the procedure? Your temperature, blood pressure, pulse, breathing rate, and blood oxygen level will be monitored until you leave the hospital or clinic. Your blood may be tested to see how you are responding to the transfusion. You may be warmed with fluids or blankets to maintain a normal body temperature. If you receive your blood transfusion in an outpatient setting, you will be told whom to contact to report any reactions. Where to find more information For more information on blood transfusions, visit the American Red Cross: redcross.org Summary A blood transfusion is a procedure in which you receive blood or a type of blood cell (blood component) through an IV. The blood you receive may come from a donor or be donated by yourself (autologous blood donation) before a planned surgery. The blood given in a transfusion is made up of different blood components. You may receive red blood cells, platelets, plasma, or white blood cells depending on the condition treated. Your  temperature, blood pressure, and pulse will be monitored before, during, and after the transfusion. After the transfusion, your blood may be tested to see how your body has responded. This information is not intended to replace advice given to you by your health care provider. Make sure you discuss any questions you have with your health care provider. Document Revised: 02/10/2019 Document Reviewed: 09/30/2018 Elsevier Patient Education  2022 Elsevier Inc.  

## 2021-03-25 LAB — BPAM RBC
Blood Product Expiration Date: 202212192359
Blood Product Expiration Date: 202212192359
ISSUE DATE / TIME: 202212021000
ISSUE DATE / TIME: 202212021000
Unit Type and Rh: 9500
Unit Type and Rh: 9500

## 2021-03-25 LAB — TYPE AND SCREEN
ABO/RH(D): O NEG
Antibody Screen: NEGATIVE
Unit division: 0
Unit division: 0

## 2021-04-04 ENCOUNTER — Telehealth: Payer: Self-pay

## 2021-04-04 NOTE — Telephone Encounter (Signed)
I called the patient and his wife today about patient's upcoming follow-up appointment in radiation oncology.   Given the state of the  COVID-19 pandemic, concerning case numbers in our community, and guidance from [facility], I offered a phone assessment with the patient to determine if coming to the clinic was necessary. The patient and his wife accepted.  I let the patient know that I had spoken with Dr. Isidore Moos, and she wanted them to know the importance of washing their hands for at least 20 seconds at a time, especially after going out in public, and before they eat. Limit going out in public whenever possible. Do not touch your face, unless your hands are clean, such as when bathing. Get plenty of rest, eat well, and stay hydrated.   Symptomatically, the patient is doing relatively well in regards to the bone mets treated with palliative radiation. Patient's wife reports patient's pain seems well controlled. She states he has not needed to utilize any pain medication (even OTC Tylenol) for a couple weeks. Her and the patient's main concern currently is his difficulty getting enough nutrition orally (related to continued mandibular issues). They both are strongly interested in discussing having a PEG placed to help supplement patient's diet. I informed them that I would make both Dr. Isidore Moos and patient's medical oncologist Dr. Julien Nordmann aware of their wishes.  I encouraged the patient and his wife to call with any further questions. Otherwise, the plan is continue with upcoming medical oncology appointment this coming Monday (04/08/21), and follow-up as needed with radiation oncology.    Patient and his wife are pleased with this plan, and we will cancel their upcoming follow-up to reduce the risk of COVID-19 transmission.

## 2021-04-05 ENCOUNTER — Other Ambulatory Visit: Payer: Self-pay

## 2021-04-05 ENCOUNTER — Ambulatory Visit: Payer: Medicare Other | Admitting: Radiation Oncology

## 2021-04-05 ENCOUNTER — Telehealth: Payer: Self-pay | Admitting: *Deleted

## 2021-04-05 ENCOUNTER — Encounter: Payer: Self-pay | Admitting: Radiation Oncology

## 2021-04-05 ENCOUNTER — Ambulatory Visit
Admission: RE | Admit: 2021-04-05 | Discharge: 2021-04-05 | Disposition: A | Discharge status: Home / Self Care | Payer: Medicare Other | Source: Ambulatory Visit | Attending: Radiation Oncology | Admitting: Radiation Oncology

## 2021-04-05 DIAGNOSIS — R634 Abnormal weight loss: Secondary | ICD-10-CM

## 2021-04-05 DIAGNOSIS — C031 Malignant neoplasm of lower gum: Secondary | ICD-10-CM

## 2021-04-05 DIAGNOSIS — R52 Pain, unspecified: Secondary | ICD-10-CM

## 2021-04-05 DIAGNOSIS — C411 Malignant neoplasm of mandible: Secondary | ICD-10-CM

## 2021-04-05 DIAGNOSIS — C7951 Secondary malignant neoplasm of bone: Secondary | ICD-10-CM

## 2021-04-05 NOTE — Telephone Encounter (Signed)
CALLED PATIENT'S WIFE- [name] TO INFORM THAT DR. SQUIRE WILL CALL TODAY, BUT IT WILL BE AFTER 5 PM TODAY, PATIENT'S WIFE- [name] VERIFIED UNDERSTANDING THIS

## 2021-04-05 NOTE — Progress Notes (Signed)
Radiation Oncology         [id number]) [phone] ________________________________  Name: Robert [name] MRN: [id number]  Date: 04/05/2021  DOB: 1940/12/12  Follow-Up Visit Note by telephone.  The patient opted for telemedicine to maximize safety during the pandemic.  MyChart video was not obtainable.   CC: Robert Has, MD  Corey Skains, MD  Diagnosis and Prior Radiotherapy:    ICD-10-CM   1. 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 Intractable pain  R52       CHIEF COMPLAINT: Here for follow-up and surveillance of bone and oral cancer  Narrative:  The patient returns today for routine follow-up by phone.  Wife and patient request PEG tube due to difficulty with oral manipulation of food, weight loss.  Also, they are having difficulty managing at home due to his weakness, worsening debility. Pain in back is improved. Getting infusions with med/onc.                                ALLERGIES:  [name] No Known Allergies.  Meds: Current Outpatient Medications  Medication Sig Dispense Refill   acetaminophen (TYLENOL) 160 MG/5ML suspension Take 30 mLs (960 mg total) by mouth every 6 (six) hours as needed for moderate pain. 1000 mL 0   docusate sodium (COLACE) 100 MG capsule Take 100 mg by mouth 2 (two) times daily as needed for mild constipation.     furosemide (LASIX) 20 MG tablet Take 1 tablet (20 mg total) by mouth daily. 30 tablet 0   metoprolol tartrate (LOPRESSOR) 100 MG tablet Take 100 mg by mouth 2 (two) times daily.     potassium chloride 20 MEQ/15ML (10%) SOLN Take 15 mLs (20 mEq total) by mouth 2 (two) times daily. 150 mL 0   prochlorperazine (COMPAZINE) 10 MG tablet Take 1 tablet (10 mg total) by mouth every 6 (six) hours as needed. 30 tablet 2   rivaroxaban (XARELTO) 20 MG TABS tablet Take 1 tablet (20 mg total) by mouth daily with supper. 90 tablet 3   No current  facility-administered medications for this encounter.   Facility-Administered Medications Ordered in Other Encounters  Medication Dose Route Frequency Provider Last Rate Last Admin   potassium chloride 10 MEQ/100ML IVPB             Physical Findings: The patient is in no acute distress. Patient is alert and oriented.  vitals were not taken for this visit. .      Lab Findings: Lab Results  Component Value Date   WBC 5.1 03/18/2021   HGB 8.1 (L) 03/18/2021   HCT 24.9 (L) 03/18/2021   MCV 84.1 03/18/2021   PLT 167 03/18/2021    Radiographic Findings: No results found.  Impression/Plan:  After a lengthy discussion, I recommended 1) PEG tube for poor PO intake, oral challenges 2) cont'd nutritionist visits 3) Palliative care referral for symptom management, home health needs 4) SLP re-referral  They are enthusiastic about recommendations 1-3. We will facilitate those orders. He will request SLP if/when they desire.   I will see him back PRN, and he will continue w/ med/onc. I wished him the best.  This encounter was provided by telemedicine platform; patient desired telemedicine during pandemic precautions.  Telephone was used [employer] video not available The patient [name] given verbal  consent for this type of encounter and [name] been advised to only accept a meeting of this type in a secure network environment. On date of service, in total, I spent 25 minutes on this encounter.   The attendants for this meeting include Robert [name]  and Robert [name] During the encounter, Robert [name] was located at [facility] Radiation Oncology Department.  Robert [name] was located at home.      _____________________________________   Robert Peak, MD

## 2021-04-06 ENCOUNTER — Other Ambulatory Visit: Payer: Self-pay

## 2021-04-08 ENCOUNTER — Inpatient Hospital Stay (HOSPITAL_BASED_OUTPATIENT_CLINIC_OR_DEPARTMENT_OTHER): Payer: Medicare Other | Admitting: Internal Medicine

## 2021-04-08 ENCOUNTER — Inpatient Hospital Stay: Payer: Medicare Other

## 2021-04-08 ENCOUNTER — Ambulatory Visit: Payer: Medicare Other | Admitting: Nutrition

## 2021-04-08 ENCOUNTER — Inpatient Hospital Stay (HOSPITAL_BASED_OUTPATIENT_CLINIC_OR_DEPARTMENT_OTHER): Payer: Medicare Other | Admitting: Nurse Practitioner

## 2021-04-08 ENCOUNTER — Other Ambulatory Visit: Payer: Self-pay

## 2021-04-08 ENCOUNTER — Encounter: Payer: Self-pay | Admitting: Internal Medicine

## 2021-04-08 ENCOUNTER — Other Ambulatory Visit: Payer: Self-pay | Admitting: Nutrition

## 2021-04-08 VITALS — BP 98/71 | HR 80 | Temp 96.0°F | Resp 20 | Ht 71.5 in | Wt 174.2 lb

## 2021-04-08 DIAGNOSIS — K5900 Constipation, unspecified: Secondary | ICD-10-CM

## 2021-04-08 DIAGNOSIS — Z7189 Other specified counseling: Secondary | ICD-10-CM

## 2021-04-08 DIAGNOSIS — C7800 Secondary malignant neoplasm of unspecified lung: Secondary | ICD-10-CM | POA: Diagnosis not present

## 2021-04-08 DIAGNOSIS — C7951 Secondary malignant neoplasm of bone: Secondary | ICD-10-CM

## 2021-04-08 DIAGNOSIS — Z5112 Encounter for antineoplastic immunotherapy: Secondary | ICD-10-CM | POA: Diagnosis not present

## 2021-04-08 DIAGNOSIS — C3490 Malignant neoplasm of unspecified part of unspecified bronchus or lung: Secondary | ICD-10-CM

## 2021-04-08 DIAGNOSIS — Z5111 Encounter for antineoplastic chemotherapy: Secondary | ICD-10-CM | POA: Diagnosis not present

## 2021-04-08 DIAGNOSIS — D649 Anemia, unspecified: Secondary | ICD-10-CM | POA: Diagnosis not present

## 2021-04-08 DIAGNOSIS — C801 Malignant (primary) neoplasm, unspecified: Secondary | ICD-10-CM | POA: Diagnosis not present

## 2021-04-08 DIAGNOSIS — Z515 Encounter for palliative care: Secondary | ICD-10-CM

## 2021-04-08 DIAGNOSIS — R5383 Other fatigue: Secondary | ICD-10-CM

## 2021-04-08 DIAGNOSIS — R530 Neoplastic (malignant) related fatigue: Secondary | ICD-10-CM

## 2021-04-08 DIAGNOSIS — R634 Abnormal weight loss: Secondary | ICD-10-CM

## 2021-04-08 LAB — CBC WITH DIFFERENTIAL (CANCER CENTER ONLY)
Abs Immature Granulocytes: 0.09 10*3/uL — ABNORMAL HIGH (ref 0.00–0.07)
Basophils Absolute: 0.1 10*3/uL (ref 0.0–0.1)
Basophils Relative: 1 %
Eosinophils Absolute: 0 10*3/uL (ref 0.0–0.5)
Eosinophils Relative: 0 %
HCT: 28.3 % — ABNORMAL LOW (ref 39.0–52.0)
Hemoglobin: 9.2 g/dL — ABNORMAL LOW (ref 13.0–17.0)
Immature Granulocytes: 1 %
Lymphocytes Relative: 6 %
Lymphs Abs: 0.7 10*3/uL (ref 0.7–4.0)
MCH: 28.9 pg (ref 26.0–34.0)
MCHC: 32.5 g/dL (ref 30.0–36.0)
MCV: 89 fL (ref 80.0–100.0)
Monocytes Absolute: 0.7 10*3/uL (ref 0.1–1.0)
Monocytes Relative: 6 %
Neutro Abs: 10.2 10*3/uL — ABNORMAL HIGH (ref 1.7–7.7)
Neutrophils Relative %: 86 %
Platelet Count: 215 10*3/uL (ref 150–400)
RBC: 3.18 MIL/uL — ABNORMAL LOW (ref 4.22–5.81)
RDW: 22.3 % — ABNORMAL HIGH (ref 11.5–15.5)
WBC Count: 11.8 10*3/uL — ABNORMAL HIGH (ref 4.0–10.5)
nRBC: 0 % (ref 0.0–0.2)

## 2021-04-08 LAB — TSH: TSH: 0.914 u[IU]/mL (ref 0.320–4.118)

## 2021-04-08 LAB — CMP (CANCER CENTER ONLY)
ALT: 14 U/L (ref 0–44)
AST: 15 U/L (ref 15–41)
Albumin: 2.3 g/dL — ABNORMAL LOW (ref 3.5–5.0)
Alkaline Phosphatase: 117 U/L (ref 38–126)
Anion gap: 11 (ref 5–15)
BUN: 10 mg/dL (ref 8–23)
CO2: 25 mmol/L (ref 22–32)
Calcium: 8.3 mg/dL — ABNORMAL LOW (ref 8.9–10.3)
Chloride: 99 mmol/L (ref 98–111)
Creatinine: 0.71 mg/dL (ref 0.61–1.24)
GFR, Estimated: >60 mL/min (ref >60)
Glucose, Bld: 141 mg/dL — ABNORMAL HIGH (ref 70–99)
Potassium: 3.4 mmol/L — ABNORMAL LOW (ref 3.5–5.1)
Sodium: 135 mmol/L (ref 135–145)
Total Bilirubin: 1.1 mg/dL (ref 0.3–1.2)
Total Protein: 6 g/dL — ABNORMAL LOW (ref 6.5–8.1)

## 2021-04-08 LAB — SAMPLE TO BLOOD BANK

## 2021-04-08 MED ORDER — SODIUM CHLORIDE 0.9 % IV SOLN
470.0000 mg | Freq: Once | INTRAVENOUS | Status: AC
  Administered 2021-04-08: 15:00:00 470 mg via INTRAVENOUS
  Filled 2021-04-08: qty 47

## 2021-04-08 MED ORDER — SODIUM CHLORIDE 0.9 % IV SOLN: Freq: Once | INTRAVENOUS | Status: AC

## 2021-04-08 MED ORDER — SODIUM CHLORIDE 0.9 % IV SOLN
175.0000 mg/m2 | Freq: Once | INTRAVENOUS | Status: AC
  Administered 2021-04-08: 12:00:00 378 mg via INTRAVENOUS
  Filled 2021-04-08: qty 63

## 2021-04-08 MED ORDER — FAMOTIDINE 20 MG IN NS 100 ML IVPB
20.0000 mg | Freq: Once | INTRAVENOUS | Status: AC
  Administered 2021-04-08: 10:00:00 20 mg via INTRAVENOUS
  Filled 2021-04-08: qty 100

## 2021-04-08 MED ORDER — PALONOSETRON HCL INJECTION 0.25 MG/5ML
0.2500 mg | Freq: Once | INTRAVENOUS | Status: AC
  Administered 2021-04-08: 10:00:00 0.25 mg via INTRAVENOUS
  Filled 2021-04-08: qty 5

## 2021-04-08 MED ORDER — KATE FARMS STANDARD 1.4 EN LIQD: 1.0000 | Freq: Every day | ENTERAL | 12 refills | Status: DC

## 2021-04-08 MED ORDER — SODIUM CHLORIDE 0.9 % IV SOLN
150.0000 mg | Freq: Once | INTRAVENOUS | Status: AC
  Administered 2021-04-08: 11:00:00 150 mg via INTRAVENOUS
  Filled 2021-04-08: qty 150

## 2021-04-08 MED ORDER — SODIUM CHLORIDE 0.9 % IV SOLN
200.0000 mg | Freq: Once | INTRAVENOUS | Status: AC
  Administered 2021-04-08: 11:00:00 200 mg via INTRAVENOUS
  Filled 2021-04-08: qty 8

## 2021-04-08 MED ORDER — DIPHENHYDRAMINE HCL 50 MG/ML IJ SOLN
50.0000 mg | Freq: Once | INTRAMUSCULAR | Status: AC
  Administered 2021-04-08: 10:00:00 50 mg via INTRAVENOUS
  Filled 2021-04-08: qty 1

## 2021-04-08 MED ORDER — SODIUM CHLORIDE 0.9 % IV SOLN
10.0000 mg | Freq: Once | INTRAVENOUS | Status: AC
  Administered 2021-04-08: 11:00:00 10 mg via INTRAVENOUS
  Filled 2021-04-08: qty 10

## 2021-04-08 NOTE — Progress Notes (Signed)
Nutrition follow-up completed with patient during infusion.  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 He had metastatic disease in October 2022 with metastatic disease involving the lung as well as mediastinal lymphadenopathy and multiple suspicious liver lesion as well as multiple osseous metastasis and right adrenal suspicious metastatic disease.  CURRENT THERAPY:  1) Palliative systemic chemotherapy carboplatin AUC of 5, paclitaxel 175 mg per metered square, Keytruda 200 mg IV every 3 weeks with Neulasta support.  First dose expected on 02/25/2021.  Status post 2 cycles  Patient states it is very difficult for him to eat.  He is requesting a feeding tube placement. Weight decreased to 174.2 pounds December 19 from 190 pounds December 2.  This is an 8% weight loss in less than 3 weeks which is significant. Noted labs: Potassium 3.4, glucose 141, and albumin 2.3. Patient states he tries to drink 1 Ensure every day. Only has a bowel movement 1-2 times weekly. He feels very weak.  Estimated nutrition needs: 2200-2400 cal, 103-119 g protein, 2.3 L fluid.  Nutrition diagnosis: Unintended weight loss continues.  Intervention: Encouraged patient to increase oral nutrition including foods rich in calories and protein. Stressed importance of increasing oral nutrition supplements. Recommended patient follow bowel regimen and discuss with MD if not improved. Patient will be at risk for refeeding syndrome secondary to severe weight loss and inadequate oral intake. Will begin tube feeding slowly and advance very slowly. Recommend running labs 2 times weekly to include c-Met, phosphorus, and magnesium. Recommend 100 mg thiamine  daily.  Tube feeding goal rate: 5 cartons [employer] 1.4 to provide 2275 cal and 100 g protein.  Tube feeding will provide 1170 mL free water.  Monitoring, evaluation, goals: Patient will tolerate tube feeding and oral intake to meet 90% minimum estimated nutrition needs.  Next visit: Will try to schedule for Thursday at 4 pm for TF education.  **Disclaimer: This note was dictated with voice recognition software. Similar sounding words can inadvertently be transcribed and this note may contain transcription errors which may not have been corrected upon publication of note.**

## 2021-04-08 NOTE — Progress Notes (Signed)
Orders written and [facility] care notified.

## 2021-04-08 NOTE — Progress Notes (Signed)
[facility] [phone]   Fax:(336) [phone]  OFFICE PROGRESS NOTE  London Pepper, MD 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 He had metastatic disease in October 2022 with metastatic disease involving the lung as well as mediastinal lymphadenopathy and multiple suspicious liver lesion as well as multiple osseous metastasis and right adrenal suspicious metastatic disease.   PDL1 Expression: 30%   PRIOR THERAPY: 1) surgical resection in October 2019 under the care of Dr. Vicie Mutters for the left buccal squamous cel carcinoma and reconstructed local mucosal flaps. 2) May 2022 status post surgical resection with bilateral neck dissection as well as adjuvant radiotherapy under the care of Dr. Isidore Moos.  3) palliative radiation under the care of Dr. Isidore Moos to the spine, scapula, and ribs.  Last treatment expected on 02/27/21.    CURRENT THERAPY:  1) Palliative systemic chemotherapy carboplatin AUC of 5, paclitaxel 175 mg per metered square, Keytruda 200 mg IV every 3 weeks with Neulasta support.  First dose expected on 02/25/2021.  Status post 2 cycles  INTERVAL HISTORY: Robert [name] 80 y.o. male returns to the clinic today for follow-up visit accompanied by his wife.  The patient continues to complain of increasing fatigue and weakness as well as lack of appetite and weight loss.  He has difficulty swallowing and requesting a PEG tube placement for nutrition.  He denied having any current chest pain but has shortness of breath with exertion with no cough or hemoptysis.  He denied having any fever or chills.  He has no nausea, vomiting, diarrhea or  constipation.  He has no headache or visual changes.  He tolerated the second cycle of his treatment well except for the fatigue.  He required PRBCs transfusion few weeks ago.  The patient is here today for evaluation before starting cycle #3 of his treatment.  MEDICAL HISTORY: Past Medical History:  Diagnosis Date   A-fib ([facility])    HTN (hypertension)     ALLERGIES:  has No Known Allergies.  MEDICATIONS:  Current Outpatient Medications  Medication Sig Dispense Refill   acetaminophen (TYLENOL) 160 MG/5ML suspension Take 30 mLs (960 mg total) by mouth every 6 (six) hours as needed for moderate pain. (Patient not taking: Reported on 04/08/2021) 1000 mL 0   docusate sodium (COLACE) 100 MG capsule Take 100 mg by mouth 2 (two) times daily as needed for mild constipation. (Patient not taking: Reported on 04/08/2021)     furosemide (LASIX) 20 MG tablet Take 1 tablet (20 mg total) by mouth daily. 30 tablet 0   metoprolol tartrate (LOPRESSOR) 100 MG tablet Take 100 mg by mouth 2 (two) times daily. (Patient not taking: Reported on 04/08/2021)     prochlorperazine (COMPAZINE) 10 MG tablet Take 1 tablet (10 mg total) by mouth every 6 (six) hours as needed. 30 tablet 2   rivaroxaban (XARELTO) 20 MG TABS tablet Take 1 tablet (20 mg total) by mouth daily with supper. 90 tablet 3   No current facility-administered medications for this visit.   Facility-Administered Medications Ordered in Other Visits  Medication Dose Route Frequency Provider Last Rate Last Admin   potassium chloride 10 MEQ/100ML IVPB  SURGICAL HISTORY:  Past Surgical History:  Procedure Laterality Date   DENTAL SURGERY      REVIEW OF SYSTEMS:  Constitutional: positive for anorexia, fatigue, and weight loss Eyes: negative Ears, nose, mouth, throat, and face: negative Respiratory: negative Cardiovascular: negative Gastrointestinal: positive for dysphagia Genitourinary:negative Integument/breast:  negative Hematologic/lymphatic: negative Musculoskeletal:positive for muscle weakness Neurological: negative Behavioral/Psych: negative Endocrine: negative Allergic/Immunologic: negative   PHYSICAL EXAMINATION: General appearance: alert, cooperative, fatigued, and no distress Head: Normocephalic, without obvious abnormality, atraumatic Neck: no adenopathy, no JVD, supple, symmetrical, trachea midline, and thyroid not enlarged, symmetric, no tenderness/mass/nodules Lymph nodes: Cervical, supraclavicular, and axillary nodes normal. Resp: clear to auscultation bilaterally Back: symmetric, no curvature. ROM normal. No CVA tenderness. Cardio: regular rate and rhythm, S1, S2 normal, no murmur, click, rub or gallop GI: soft, non-tender; bowel sounds normal; no masses,  no organomegaly Extremities: extremities normal, atraumatic, no cyanosis or edema Neurologic: Alert and oriented X 3, normal strength and tone. Normal symmetric reflexes. Normal coordination and gait  ECOG PERFORMANCE STATUS: 1 - Symptomatic but completely ambulatory  Blood pressure 98/71, pulse 80, temperature (!) 96 F (35.6 C), temperature source Tympanic, resp. rate 20, height 5' 11.5" (1.816 m), weight 174 lb 3.2 oz (79 kg), SpO2 100 %.  LABORATORY DATA: Lab Results  Component Value Date   WBC 11.8 (H) 04/08/2021   HGB 9.2 (L) 04/08/2021   HCT 28.3 (L) 04/08/2021   MCV 89.0 04/08/2021   PLT 215 04/08/2021      Chemistry      Component Value Date/Time   NA 132 (L) 03/18/2021 0900   K 2.8 (L) 03/18/2021 0900   CL 97 (L) 03/18/2021 0900   CO2 25 03/18/2021 0900   BUN 13 03/18/2021 0900   CREATININE 0.67 03/18/2021 0900      Component Value Date/Time   CALCIUM 7.9 (L) 03/18/2021 0900   ALKPHOS 104 03/18/2021 0900   AST 36 03/18/2021 0900   ALT 36 03/18/2021 0900   BILITOT 1.4 (H) 03/18/2021 0900       RADIOGRAPHIC STUDIES: No results found.  ASSESSMENT AND PLAN: This is a very pleasant 80 years old  white male diagnosed with metastatic squamous cell carcinoma of questionable primary lung vs. Head and neck. 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 He had metastatic disease in October 2022 with metastatic disease involving the lung as well as mediastinal lymphadenopathy and multiple suspicious liver lesion as well as multiple osseous metastasis and right adrenal suspicious metastatic disease. The patient status post surgical resection in October 2019 under the care of Dr. Vicie Mutters for the left buccal squamous cel carcinoma and reconstructed local mucosal flaps.  In May 2022 status post surgical resection with bilateral neck dissection as well as adjuvant radiotherapy under the care of Dr. Isidore Moos.  He also had palliative radiation under the care of Dr. Isidore Moos to the spine, scapula, and ribs.  Last treatment expected on 02/27/21.  Is currently undergoing systemic chemotherapy with carboplatin for AUC of 5, paclitaxel 175 Mg/M2 and Keytruda 200 Mg IV every 3 weeks with Neulasta support status post 2 cycles.  He is tolerating the treatment well except for fatigue and weight loss. I discussed with the patient the option of his treatment and recommended for him to delay the treatment by 1 week until he feels better after Christmas but he would like  to proceed with treatment today as planned.  I will see him back for follow-up visit in 3 weeks for evaluation with repeat CT scan of the chest, neck, abdomen and pelvis for restaging of his disease. For the poor nutrition and difficulty swallowing, I will send the patient to IR for PEG tube placement and this will be managed by the dietitian at the cancer center after placement. For the anemia, his hemoglobin and hematocrit are acceptable today for treatment.  We will continue to monitor closely  and consider The patient for transfusion if needed. He was advised to call immediately if he has any other concerning symptoms in the interval. The patient voices understanding of current disease status and treatment options and is in agreement with the current care plan.  All questions were answered. The patient knows to call the clinic with any problems, questions or concerns. We can certainly see the patient much sooner if necessary.  The total time spent in the appointment was 35 minutes.  Disclaimer: This note was dictated with voice recognition software. Similar sounding words can inadvertently be transcribed and may not be corrected upon review.

## 2021-04-08 NOTE — Progress Notes (Signed)
Patient declined use of cryotherapy.

## 2021-04-08 NOTE — Patient Instructions (Signed)
[facility] ONCOLOG[facility]   Discharge Instructions: Thank you for choosing [facility] to provide your oncology and hematology care.   If you have a lab appointment with the [facility], please go directly to the [facility] and check in at the registration area.   Wear comfortable clothing and clothing appropriate for easy access to any Portacath or PICC line.   We strive to give you quality time with your provider. You may need to reschedule your appointment if you arrive late (15 or more minutes).  Arriving late affects you and other patients whose appointments are after yours.  Also, if you miss three or more appointments without notifying the office, you may be dismissed from the clinic at the providers discretion.      For prescription refill requests, have your pharmacy contact our office and allow 72 hours for refills to be completed.    Today you received the following chemotherapy and/or immunotherapy agents: pembrolizumab, paclitaxel and carboplatin      To help prevent nausea and vomiting after your treatment, we encourage you to take your nausea medication as directed.  BELOW ARE SYMPTOMS THAT SHOULD BE REPORTED IMMEDIATELY: *FEVER GREATER THAN 100.4 F (38 C) OR HIGHER *CHILLS OR SWEATING *NAUSEA AND VOMITING THAT IS NOT CONTROLLED WITH YOUR NAUSEA MEDICATION *UNUSUAL SHORTNESS OF BREATH *UNUSUAL BRUISING OR BLEEDING *URINARY PROBLEMS (pain or burning when urinating, or frequent urination) *BOWEL PROBLEMS (unusual diarrhea, constipation, pain near the anus) TENDERNESS IN MOUTH AND THROAT WITH OR WITHOUT PRESENCE OF ULCERS (sore throat, sores in mouth, or a toothache) UNUSUAL RASH, SWELLING OR PAIN  UNUSUAL VAGINAL DISCHARGE OR ITCHING   Items with * indicate a potential emergency and should be followed up as soon as possible or go to the Emergency Department if any problems should occur.  Please show the CHEMOTHERAPY ALERT CARD or  IMMUNOTHERAPY ALERT CARD at check-in to the Emergency Department and triage nurse.  Should you have questions after your visit or need to cancel or reschedule your appointment, please contact [facility]  Dept: [phone]  and follow the prompts.  Office hours are 8:00 a.m. to 4:30 p.m. Monday - Friday. Please note that voicemails left after 4:00 p.m. may not be returned until the following business day.  We are closed weekends and major holidays. You have access to a nurse at all times for urgent questions. Please call the main number to the clinic Dept: [phone] and follow the prompts.   For any non-urgent questions, you may also contact your provider using MyChart. We now offer e-Visits for anyone 47 and older to request care online for non-urgent symptoms. For details visit [email or web address].   Also download the MyChart app! Go to the app store, search "MyChart", open the app, select [facility], and log in with your MyChart username and password.  Due to Covid, a mask is required upon entering the hospital/clinic. If you do not have a mask, one will be given to you upon arrival. For doctor visits, patients may have 1 support person aged 38 or older with them. For treatment visits, patients cannot have anyone with them due to current Covid guidelines and our immunocompromised population.

## 2021-04-08 NOTE — Progress Notes (Signed)
Palliative Medicine [facility]  Telephone:(336) [phone] Fax:(336) [phone]   Name: Robert [name] Date: 04/08/2021 MRN: [id number]  DOB: [birth year]  Patient Care Team: Farris Has, MD as PCP - General (Family Medicine) Malmfelt, Lise Auer, RN as Oncology Nurse Navigator Lonie Peak, MD as Consulting Physician (Radiation Oncology) Corey Skains, MD as Referring Physician (Otolaryngology)    REASON FOR CONSULTATION: Robert [name] is a 80 y.o. male with history of metastatic squamous cell carcinoma (01/2018) s/p surgical resection with recurrence in May 2022, now with metastatic disease involving the lungs, multiple suspicious liver lesions, and multiple osseous. Palliative ask to see for symptom management and goals of care.    SOCIAL HISTORY:     reports that he has never smoked. He has quit using smokeless tobacco.  His smokeless tobacco use included chew. He reports current alcohol use. He reports that he does not use drugs.  ADVANCE DIRECTIVES:  None on file   CODE STATUS:   PAST MEDICAL HISTORY: Past Medical History:  Diagnosis Date   A-fib ([facility])    HTN (hypertension)     PAST SURGICAL HISTORY:  Past Surgical History:  Procedure Laterality Date   DENTAL SURGERY      HEMATOLOGY/ONCOLOGY HISTORY:  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HCC)  10/05/2020 Initial Diagnosis   Cancer of lower gum (HCC)   10/05/2020 Cancer Staging   Staging form: Oral Cavity, AJCC 8th Edition - Pathologic stage from 10/05/2020: Stage IVA (pT4a, pN2a, cM0) - Signed by Lonie Peak, MD on 10/05/2020 Stage prefix: Initial diagnosis Histologic grade (G): G2 Histologic grading system: 3 grade system Presence of extranodal extension: Present Perineural invasion (PNI): Present    SCC (squamous cell carcinoma of lung) (HCC)  02/18/2021 Initial Diagnosis   SCC (squamous cell carcinoma of lung) (HCC)   02/27/2021 -  Chemotherapy   Patient is on Treatment Plan  : LUNG NSCLC Carboplatin + Paclitaxel + Pembrolizumab q21d x 4 cycles / Pembrolizumab Maintenance Q21D       ALLERGIES:  has No Known Allergies.  MEDICATIONS:  Current Outpatient Medications  Medication Sig Dispense Refill   acetaminophen (TYLENOL) 160 MG/5ML suspension Take 30 mLs (960 mg total) by mouth every 6 (six) hours as needed for moderate pain. (Patient not taking: Reported on 04/08/2021) 1000 mL 0   docusate sodium (COLACE) 100 MG capsule Take 100 mg by mouth 2 (two) times daily as needed for mild constipation. (Patient not taking: Reported on 04/08/2021)     furosemide (LASIX) 20 MG tablet Take 1 tablet (20 mg total) by mouth daily. 30 tablet 0   metoprolol tartrate (LOPRESSOR) 100 MG tablet Take 100 mg by mouth 2 (two) times daily. (Patient not taking: Reported on 04/08/2021)     Nutritional Supplements (K[employer]ARMS STANDARD 1.4) LIQD 1 Container by Enteral route 5 (five) times daily. 1625 mL 12   prochlorperazine (COMPAZINE) 10 MG tablet Take 1 tablet (10 mg total) by mouth every 6 (six) hours as needed. 30 tablet 2   rivaroxaban (XARELTO) 20 MG TABS tablet Take 1 tablet (20 mg total) by mouth daily with supper. 90 tablet 3   No current facility-administered medications for this visit.   Facility-Administered Medications Ordered in Other Visits  Medication Dose Route Frequency Provider Last Rate Last Admin   CARBOplatin (PARAPLATIN) 470 mg in sodium chloride 0.9 % 250 mL chemo infusion  470 mg Intravenous Once Si Gaul, MD       PACLitaxel (TAXOL) 378 mg in  sodium chloride 0.9 % 500 mL chemo infusion (> 80mg /m2)  175 mg/m2 (Treatment Plan Recorded) Intravenous Once Si Gaul, MD 188 mL/hr at 04/08/21 1205 378 mg at 04/08/21 1205   potassium chloride 10 MEQ/100ML IVPB             VITAL SIGNS: There were no vitals taken for this visit. There were no vitals filed for this visit.  Estimated body mass index is 23.96 kg/m as calculated from the following:   Height  as of an earlier encounter on 04/08/21: 5' 11.5" (1.816 m).   Weight as of an earlier encounter on 04/08/21: 174 lb 3.2 oz (79 kg).  LABS: CBC:    Component Value Date/Time   WBC 11.8 (H) 04/08/2021 0811   WBC 19.0 (H) 02/12/2021 0515   HGB 9.2 (L) 04/08/2021 0811   HCT 28.3 (L) 04/08/2021 0811   PLT 215 04/08/2021 0811   MCV 89.0 04/08/2021 0811   NEUTROABS 10.2 (H) 04/08/2021 0811   LYMPHSABS 0.7 04/08/2021 0811   MONOABS 0.7 04/08/2021 0811   EOSABS 0.0 04/08/2021 0811   BASOSABS 0.1 04/08/2021 0811   Comprehensive Metabolic Panel:    Component Value Date/Time   NA 135 04/08/2021 0811   K 3.4 (L) 04/08/2021 0811   CL 99 04/08/2021 0811   CO2 25 04/08/2021 0811   BUN 10 04/08/2021 0811   CREATININE 0.71 04/08/2021 0811   GLUCOSE 141 (H) 04/08/2021 0811   CALCIUM 8.3 (L) 04/08/2021 0811   AST 15 04/08/2021 0811   ALT 14 04/08/2021 0811   ALKPHOS 117 04/08/2021 0811   BILITOT 1.1 04/08/2021 0811   PROT 6.0 (L) 04/08/2021 0811   ALBUMIN 2.3 (L) 04/08/2021 1610    RADIOGRAPHIC STUDIES: No results found.  PERFORMANCE STATUS (ECOG) : 1 - Symptomatic but completely ambulatory  Review of Systems  Constitutional:  Positive for appetite change, fatigue and unexpected weight change.  HENT:  Positive for trouble swallowing.   Unless otherwise noted, a complete review of systems is negative.  Physical Exam General: NAD, appears fatigued Cardiovascular: regular rate and rhythm Pulmonary: clear ant fields Abdomen: soft, nontender, + bowel sounds Extremities: no edema, no joint deformities Neurological: AAO x3  IMPRESSION:  This is my initial visit with Robert [name].  He is currently undergoing infusion and comfortably resting in the recliner.  Denies pain, shortness of breath.  Does endorse fatigue.  I introduced myself, Research officer, trade union, and palliative's role in collaboration with his oncology team.  I also spoke with patient's wife at length.  Concept of Palliative Care was  introduced as specialized medical care for people and their families living with serious illness.  It focuses on providing relief from the symptoms and stress of a serious illness.  The goal is to improve quality of life for both the patient and the family. Values and goals of care important to patient and family were attempted to be elicited.  Patient verbalized understanding.  Robert [name] and his wife have been married for over 55 years.  They have 2 children and 3 grandchildren.  His son lives in [place].  Patient is a retired Environmental health practitioner.  He enjoys golfing, fishing, and spending time with his family.  Per wife patient is ambulatory in the home without assistive devices.  Is able to perform all ADLs independently.  Does endorse increased weakness and fatigue which they relate to his chemotherapy infusions.  He is not having any significant pain.  Occasional mild aches which  is resolved with liquid Tylenol.  Is not having any problems sleeping and endorses taking naps throughout the day.  He does have some occasional constipation.  Wife reports they have MiraLAX and Colace in the home however he does not take daily.  Recommended daily MiraLAX for bowel regimen and if he notices stools become loose he may then begin to take it every other day.  Robert [name] and his wife expresses his biggest concern outside of his fatigue is his poor appetite and inability to maintain appropriate nutrition that is required.  Wife states he has lost a significant amount of weight.  He has weighed approximately 270 pounds most of his life now weighing 174 pounds.  She reports approximately a 10-15 pound weight loss just over the past month.  Patient has made decisions to proceed with PEG tube to allow him an opportunity to have a source of nutrition with hopes this will increase his energy level and allow him to regain some much-needed weight.  We discussed Her current illness and what it  means in the larger context of Her on-going co-morbidities. Natural disease trajectory and expectations were discussed.  Robert [name] and his wife are remaining hopeful for stability.  We will like to afford him every opportunity to continue to thrive.  They are aware that his current treatment regimen is with palliative intent.  I discussed the importance of continued conversation with family and their medical providers regarding overall plan of care and treatment options, ensuring decisions are within the context of the patients values and GOCs.  PLAN: Patient and wife remaining hopeful with PEG placement he will have increased strength, decrease fatigue/weakness, and will provide him the nutritional support that he needs to prevent further weight loss.  This is being arranged by Dr. Shirline Frees and his team. MiraLAX daily for bowel regimen to prevent constipation Recommended ongoing goals of care discussions in the setting of palliative intent for all treatments. I will plan to see patient back in 2-3 weeks in collaboration with other oncology appointments.  Patient expressed understanding and was in agreement with this plan. He also understands that He can call the clinic at any time with any questions, concerns, or complaints.     Time Total: 50 min  Visit consisted of counseling and education dealing with the complex and emotionally intense issues of symptom management and palliative care in the setting of serious and potentially life-threatening illness.Greater than 50%  of this time was spent counseling and coordinating care related to the above assessment and plan.  Signed by: Willette Alma, AGPCNP-BC Palliative Medicine Team

## 2021-04-09 ENCOUNTER — Telehealth (HOSPITAL_COMMUNITY): Payer: Self-pay

## 2021-04-09 ENCOUNTER — Other Ambulatory Visit: Payer: Self-pay | Admitting: Student

## 2021-04-09 NOTE — Telephone Encounter (Signed)
-----   Message from Sandi Mariscal, MD sent at 04/09/2021  9:28 AM EST ----- Regarding: RE: Peg placement OK for attempted G-tube placement based on PET CT from 03/04/21.  Please have pt drink barium night before the procedure (if able).  Cathren Harsh   ----- Message ----- From: Danielle Dess Sent: 04/08/2021  11:36 AM EST To: [facility] Procedure Requests Subject: Peg placement                                  Procedure: Peg placement  Dx: Malignant neoplasm of unspecified part of unspecified bronchus or lung   Imaging: NM Pet done on 03/04/21  Ordering: Dr. Julien Nordmann [phone]  Please review.   Thanks,  Lia Foyer

## 2021-04-10 ENCOUNTER — Emergency Department (HOSPITAL_COMMUNITY)
Admission: EM | Admit: 2021-04-10 | Discharge: 2021-04-10 | Disposition: A | Discharge status: Home / Self Care | Payer: Medicare Other | Attending: Emergency Medicine | Admitting: Emergency Medicine

## 2021-04-10 ENCOUNTER — Other Ambulatory Visit: Payer: Self-pay

## 2021-04-10 ENCOUNTER — Emergency Department (HOSPITAL_COMMUNITY): Payer: Medicare Other

## 2021-04-10 ENCOUNTER — Ambulatory Visit (HOSPITAL_COMMUNITY)
Admission: RE | Admit: 2021-04-10 | Discharge: 2021-04-10 | Disposition: A | Discharge status: Home / Self Care | Payer: Medicare Other | Source: Ambulatory Visit | Attending: Internal Medicine | Admitting: Internal Medicine

## 2021-04-10 ENCOUNTER — Ambulatory Visit: Payer: Medicare Other

## 2021-04-10 DIAGNOSIS — C7951 Secondary malignant neoplasm of bone: Secondary | ICD-10-CM | POA: Diagnosis not present

## 2021-04-10 DIAGNOSIS — Z6825 Body mass index (BMI) 25.0-25.9, adult: Secondary | ICD-10-CM | POA: Insufficient documentation

## 2021-04-10 DIAGNOSIS — Z79899 Other long term (current) drug therapy: Secondary | ICD-10-CM | POA: Insufficient documentation

## 2021-04-10 DIAGNOSIS — I10 Essential (primary) hypertension: Secondary | ICD-10-CM | POA: Insufficient documentation

## 2021-04-10 DIAGNOSIS — E46 Unspecified protein-calorie malnutrition: Secondary | ICD-10-CM | POA: Diagnosis not present

## 2021-04-10 DIAGNOSIS — C781 Secondary malignant neoplasm of mediastinum: Secondary | ICD-10-CM | POA: Insufficient documentation

## 2021-04-10 DIAGNOSIS — Z431 Encounter for attention to gastrostomy: Secondary | ICD-10-CM | POA: Diagnosis not present

## 2021-04-10 DIAGNOSIS — C7800 Secondary malignant neoplasm of unspecified lung: Secondary | ICD-10-CM | POA: Insufficient documentation

## 2021-04-10 DIAGNOSIS — Z85118 Personal history of other malignant neoplasm of bronchus and lung: Secondary | ICD-10-CM | POA: Insufficient documentation

## 2021-04-10 DIAGNOSIS — Z85830 Personal history of malignant neoplasm of bone: Secondary | ICD-10-CM | POA: Diagnosis not present

## 2021-04-10 DIAGNOSIS — C3490 Malignant neoplasm of unspecified part of unspecified bronchus or lung: Secondary | ICD-10-CM | POA: Diagnosis not present

## 2021-04-10 DIAGNOSIS — Z85818 Personal history of malignant neoplasm of other sites of lip, oral cavity, and pharynx: Secondary | ICD-10-CM | POA: Diagnosis not present

## 2021-04-10 DIAGNOSIS — I4891 Unspecified atrial fibrillation: Secondary | ICD-10-CM | POA: Insufficient documentation

## 2021-04-10 DIAGNOSIS — Z7901 Long term (current) use of anticoagulants: Secondary | ICD-10-CM | POA: Diagnosis not present

## 2021-04-10 HISTORY — PX: IR FLUORO RM 30-60 MIN: IMG2384

## 2021-04-10 LAB — CBC
HCT: 30.6 % — ABNORMAL LOW (ref 39.0–52.0)
HCT: 26.6 % — ABNORMAL LOW (ref 39.0–52.0)
Hemoglobin: 9.4 g/dL — ABNORMAL LOW (ref 13.0–17.0)
Hemoglobin: 8.2 g/dL — ABNORMAL LOW (ref 13.0–17.0)
MCH: 28.6 pg (ref 26.0–34.0)
MCH: 29 pg (ref 26.0–34.0)
MCHC: 30.7 g/dL (ref 30.0–36.0)
MCHC: 30.8 g/dL (ref 30.0–36.0)
MCV: 93 fL (ref 80.0–100.0)
MCV: 94 fL (ref 80.0–100.0)
Platelets: 195 10*3/uL (ref 150–400)
Platelets: 141 10*3/uL — ABNORMAL LOW (ref 150–400)
RBC: 3.29 MIL/uL — ABNORMAL LOW (ref 4.22–5.81)
RBC: 2.83 MIL/uL — ABNORMAL LOW (ref 4.22–5.81)
RDW: 22.7 % — ABNORMAL HIGH (ref 11.5–15.5)
RDW: 22.4 % — ABNORMAL HIGH (ref 11.5–15.5)
WBC: 12.8 10*3/uL — ABNORMAL HIGH (ref 4.0–10.5)
WBC: 9 10*3/uL (ref 4.0–10.5)
nRBC: 0 % (ref 0.0–0.2)
nRBC: 0 % (ref 0.0–0.2)

## 2021-04-10 LAB — MAGNESIUM: Magnesium: 1.8 mg/dL (ref 1.7–2.4)

## 2021-04-10 LAB — BASIC METABOLIC PANEL
Anion gap: 6 (ref 5–15)
BUN: 15 mg/dL (ref 8–23)
CO2: 27 mmol/L (ref 22–32)
Calcium: 7.4 mg/dL — ABNORMAL LOW (ref 8.9–10.3)
Chloride: 100 mmol/L (ref 98–111)
Creatinine, Ser: 0.65 mg/dL (ref 0.61–1.24)
GFR, Estimated: >60 mL/min (ref >60)
Glucose, Bld: 99 mg/dL (ref 70–99)
Potassium: 3.5 mmol/L (ref 3.5–5.1)
Sodium: 133 mmol/L — ABNORMAL LOW (ref 135–145)

## 2021-04-10 LAB — PROTIME-INR
INR: 1 (ref 0.8–1.2)
Prothrombin Time: 12.9 seconds (ref 11.4–15.2)

## 2021-04-10 IMAGING — XA IR FLUORO RM 0-60 MIN
1 series · 2 of 2 positions shown · IV contrast (agent unspecified)
Comparison: none

CLINICAL DATA: 80-year-old male with history of metastatic squamous
cell carcinoma. Percutaneous gastrostomy tube requested for
supplemental nutrition.

EXAM:
IR FLUORO RM 0-60 MIN
ANESTHESIA/SEDATION:
None.
MEDICATIONS:
CONTRAST:  None.
PROCEDURE:
COMPLICATIONS:
None immediate

[Series 1: fl (-) angio · 2 of 2 slices shown]
[im 1/2]
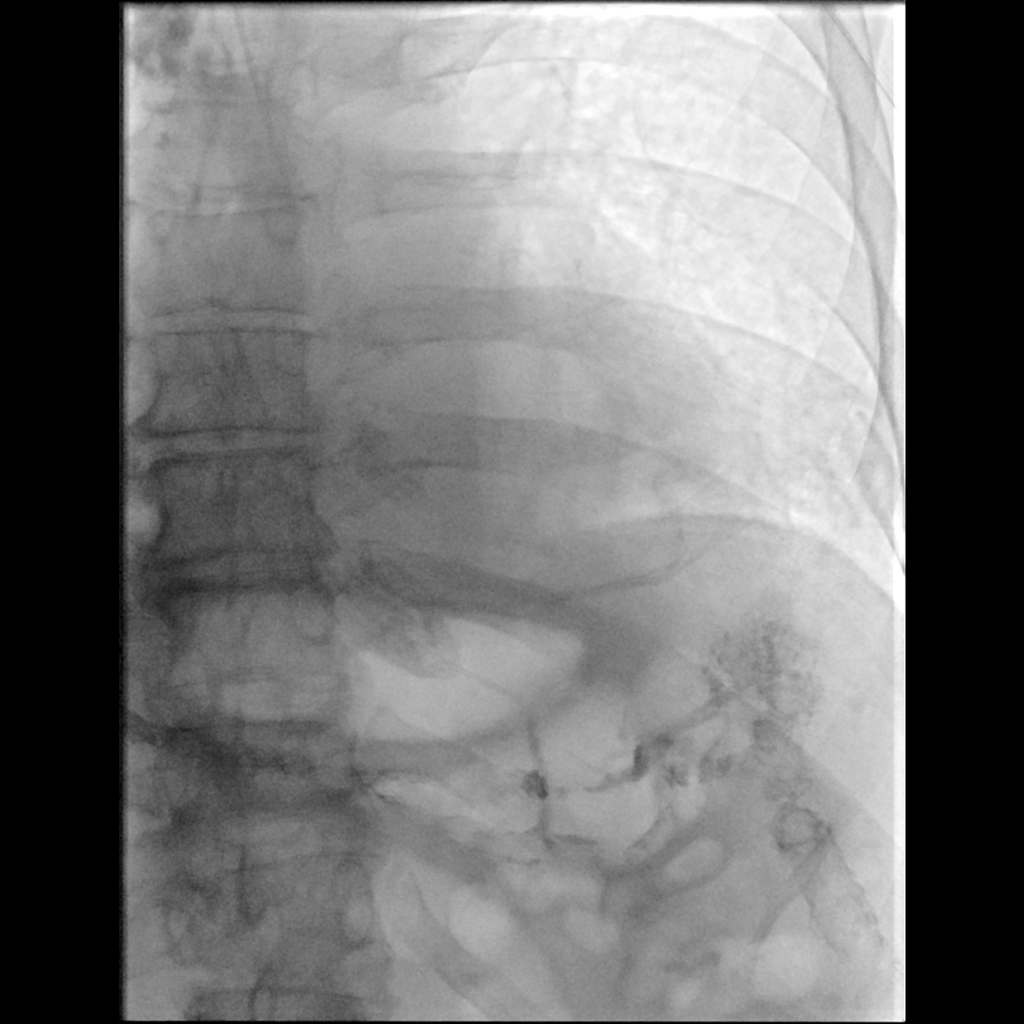
[im 2/2]
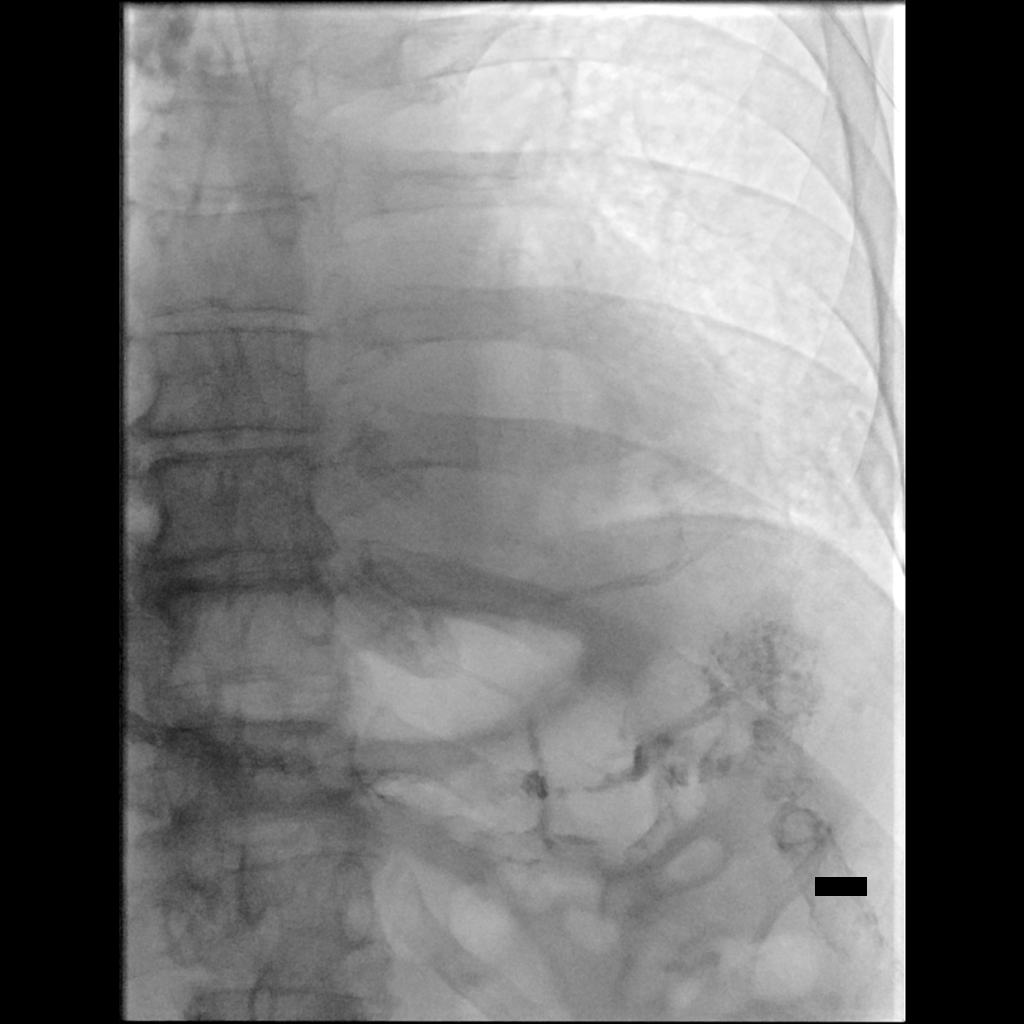

[2 of 2 positions shown; findings below may reference images not displayed]

FINDINGS: Immediately prior to procedure, the patient was noted to be in
atrial fibrillation with rapid ventricular response. Systolic blood
pressures were in the 90s, similar to his recorded baseline. The
patient was asymptomatic. Patient was given 500 mL normal saline
bolus without change in atrial fibrillation. An EKG was obtained.
The patient was then transferred to the emergency department for
further evaluation.
IMPRESSION: Gastrostomy tube placement aborted due to new onset atrial
fibrillation with RVR. The patient was transferred to the emergency
department in stable condition.

PLAN:
Gastrostomy tube placement on an outpatient basis can be rescheduled
at any time.

## 2021-04-10 IMAGING — DX DG CHEST 1V PORT
1 series · 1 of 1 positions shown · non-contrast
Comparison: CT chest [DATE], CT PET [DATE]

CLINICAL DATA: atrial fib.  Hypertension

EXAM:
PORTABLE CHEST 1 VIEW

[chest ap]
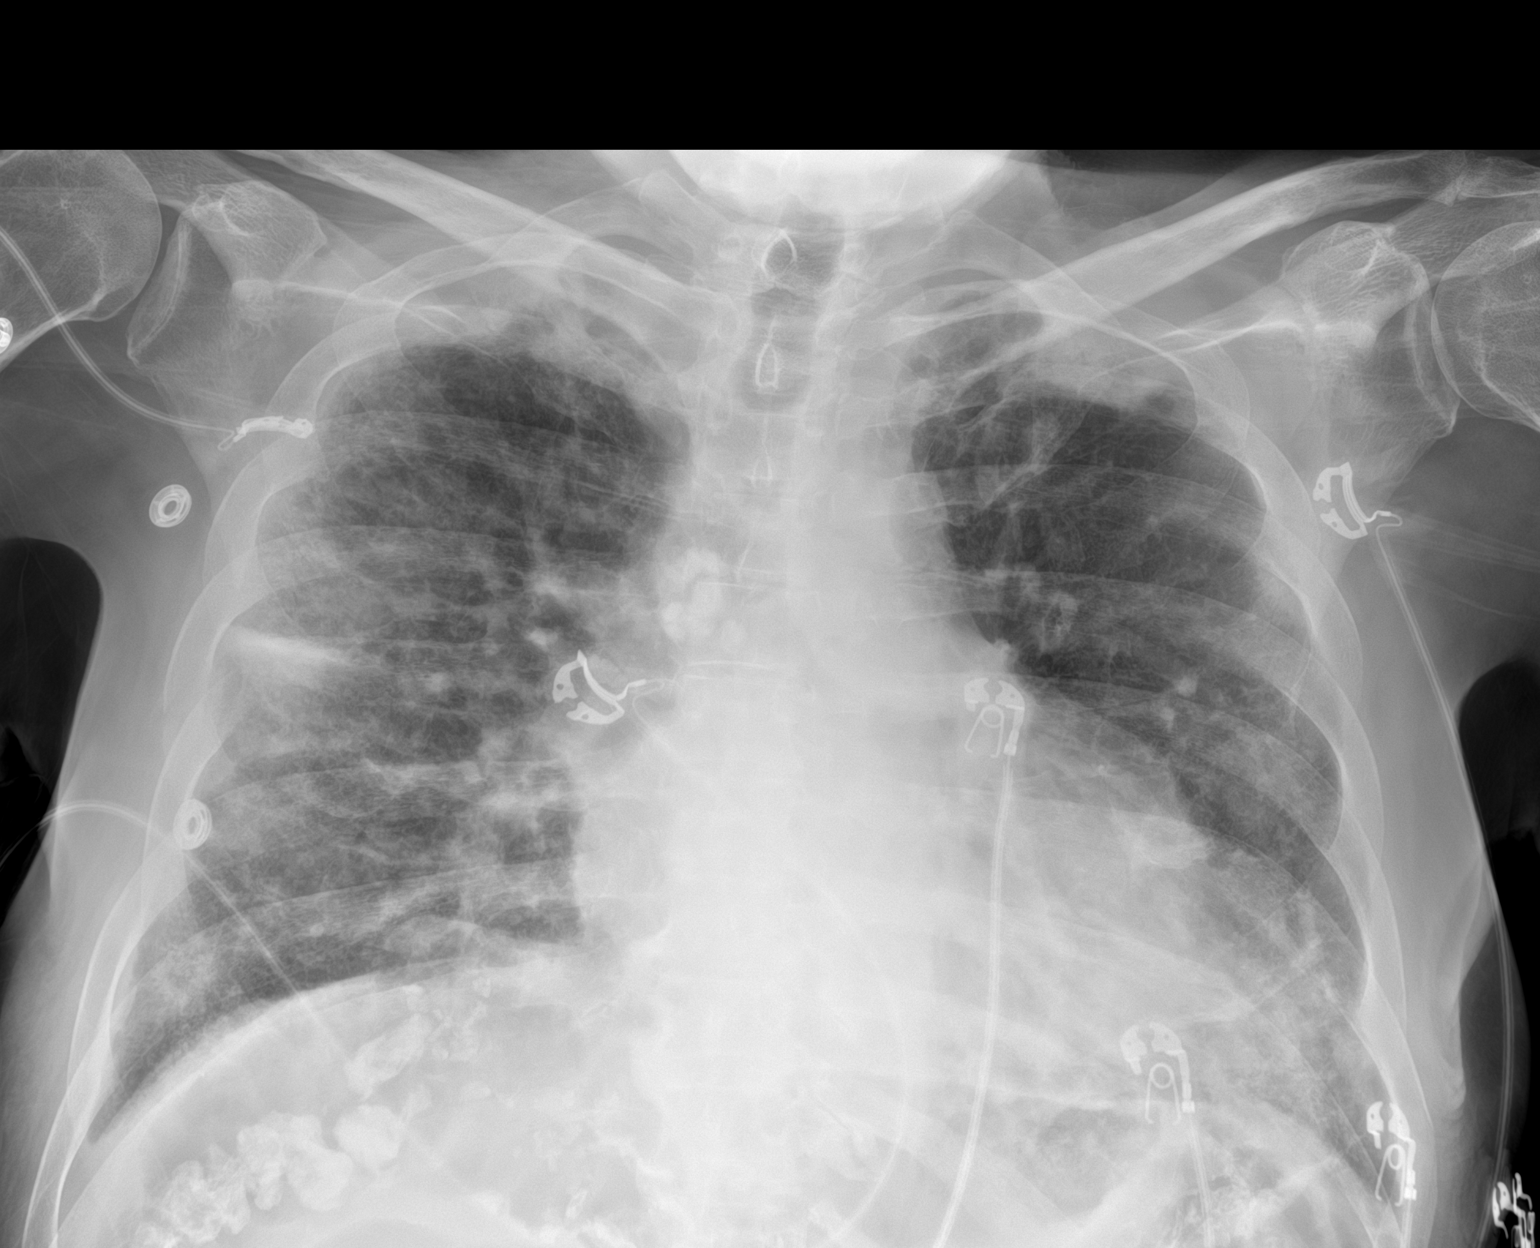

[1 of 1 positions shown; findings below may reference images not displayed]

FINDINGS: The heart and mediastinal contours are within normal limits.

Low lung volumes. Biapical pleural/pulmonary scarring. Patchy
airspace opacity and nodularities consistent with known non-small
cell lung cancer question 1 cm cavitary lesion at the right base. No
pulmonary edema. Blunting of bilateral costophrenic angles likely
representing trace pleural effusions. No pneumothorax.

No acute osseous abnormality.
IMPRESSION: 1. Low lung volumes with patchy airspace opacity and nodularities
consistent with known non-small cell lung cancer. Superimposed
infection/inflammation not excluded.
2. Question 1 cm cavitary lesion at the right base.

## 2021-04-10 MED ORDER — LACTATED RINGERS IV BOLUS
1000.0000 mL | Freq: Once | INTRAVENOUS | Status: AC
  Administered 2021-04-10: 16:00:00 1000 mL via INTRAVENOUS

## 2021-04-10 MED ORDER — METOPROLOL TARTRATE 5 MG/5ML IV SOLN
5.0000 mg | INTRAVENOUS | Status: AC | PRN
  Administered 2021-04-10 (×3): 5 mg via INTRAVENOUS
  Filled 2021-04-10 (×3): qty 5

## 2021-04-10 MED ORDER — METOPROLOL TARTRATE 25 MG PO TABS
50.0000 mg | ORAL_TABLET | Freq: Once | ORAL | Status: AC
  Administered 2021-04-10: 17:00:00 50 mg via ORAL
  Filled 2021-04-10: qty 2

## 2021-04-10 MED ORDER — SODIUM CHLORIDE 0.9 % IV BOLUS
500.0000 mL | Freq: Once | INTRAVENOUS | Status: AC
  Administered 2021-04-10: 13:00:00 500 mL via INTRAVENOUS

## 2021-04-10 MED ORDER — LIDOCAINE VISCOUS HCL 2 % MT SOLN
OROMUCOSAL | Status: AC
  Filled 2021-04-10: qty 15

## 2021-04-10 MED ORDER — CEFAZOLIN SODIUM-DEXTROSE 2-4 GM/100ML-% IV SOLN: 2.0000 g | INTRAVENOUS | Status: DC

## 2021-04-10 MED ORDER — STERILE WATER FOR INJECTION IJ SOLN
INTRAMUSCULAR | Status: AC
  Filled 2021-04-10: qty 10

## 2021-04-10 MED ORDER — SODIUM CHLORIDE 0.9 % IV SOLN: INTRAVENOUS | Status: DC

## 2021-04-10 MED ORDER — LIDOCAINE-EPINEPHRINE 1 %-1:100000 IJ SOLN
INTRAMUSCULAR | Status: AC
  Filled 2021-04-10: qty 1

## 2021-04-10 MED ORDER — LIDOCAINE HCL 1 % IJ SOLN
INTRAMUSCULAR | Status: AC
  Filled 2021-04-10: qty 20

## 2021-04-10 NOTE — Sedation Documentation (Signed)
Pt transported to ED via bed accompanied by RN and pt's wife. Chloe RN at bedside to receive pt. Handoff completed. Pt's heart rate continues to be in the 140-160 range. Pt in no distress at this time.

## 2021-04-10 NOTE — Sedation Documentation (Signed)
Pt's heart rate still remains in the 140-160 range even after bolus administered. Dr. Serafina Royals notified. Informed of 12 lead EKG result. Per MD abort procedure at this time and take pt to ED for eval. Pt's wife called to bedside and informed them of plan of care. Both of them are in agreement with plan. Pt in no distress at this time.

## 2021-04-10 NOTE — Sedation Documentation (Signed)
Pt now in A-fib with RVR with rate in the 140-170s. SBP ranges in the 80-90 which the pt states is his usual. Pt otherwise has no complaints and in no distress. Dr. Serafina Royals notified. New orders received.

## 2021-04-10 NOTE — ED Provider Notes (Addendum)
°  Provider Note MRN:  [id number]  Arrival date & time: 04/10/21    ED Course and Medical Decision Making  Assumed care from [facility] at shift change.  See not from prior team for complete details, in brief: 80 year old male with history of malignancy presents to the ER from radiology department secondary to A. fib with RVR.  Patient has been holding his metoprolol for the past 4 days in preparation for procedure.  Is also holding his blood thinner.  Heart rate elevated at IR, low blood pressure which appears to be at his baseline.  To the ER for evaluation of rapid heart rate.  Plan per prior physician if patient hemodynamically stable would recommend discharge home.  Discussed with IR Dr. Serafina Royals who recommends continue to hold DOAC, may take beta-blocker.  Recommend they call radiology office in the morning to reschedule procedure.  The patient improved significantly and was discharged in stable condition. Detailed discussions were had with the patient regarding current findings, and need for close f/u with PCP or on call doctor. The patient has been instructed to return immediately if the symptoms worsen in any way for re-evaluation. Patient verbalized understanding and is in agreement with current care plan. All questions answered prior to discharge.    .Critical Care Performed by: Jeanell Sparrow, DO Authorized by: Jeanell Sparrow, DO   Critical care provider statement:    Critical care time (minutes):  30   Critical care time was exclusive of:  Separately billable procedures and treating other patients   Critical care was necessary to treat or prevent imminent or life-threatening deterioration of the following conditions:  Cardiac failure   Critical care was time spent personally by me on the following activities:  Development of treatment plan with patient or surrogate, discussions with consultants, evaluation of patient's response to treatment, examination of patient, ordering and review of  laboratory studies, ordering and review of radiographic studies, ordering and performing treatments and interventions, pulse oximetry, re-evaluation of patient's condition and review of old charts Comments:     Afib with rvr  Final Clinical Impressions(s) / ED Diagnoses     ICD-10-CM   1. Atrial fibrillation with RVR ([place])  I48.91       ED Discharge Orders     None         Discharge Instructions      Please continue holding your Xarelto.  You may take your metoprolol as you normally would.  Please call radiology department tomorrow morning to reschedule procedure  Return to emergency department for any worsening or worrisome symptoms.         Jeanell Sparrow, DO 04/10/21 [place], [place], DO 04/10/21 1849

## 2021-04-10 NOTE — H&P (Signed)
Chief Complaint: Malnutrition. Request is for gastrostomy tube placement  Referring Physician(s): Mohamed,Mohamed  Supervising Physician: Ruthann Cancer  Patient Status: [facility] - Out-pt  History of Present Illness: Robert [name] is a 80 y.o. male outpatient. History of a fib (on xarelto). 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 He had metastatic disease in October 2022 with metastatic disease involving the lung as well as mediastinal lymphadenopathy and multiple suspicious liver lesion as well as multiple osseous metastasis and right adrenal suspicious metastatic disease.  Team is requesting gastrostomy tube placement for nutrition purposes.   Currently without any significant complaints.  Endorsing back pain which he associates with the hard beds in short stay. Patient alert and laying in bed, calm and comfortable. Denies any fevers, headache, chest pain, SOB, cough, abdominal pain, nausea, vomiting or bleeding. Return precautions and treatment recommendations and follow-up discussed with the patient  who is agreeable with the plan.    Past Medical History:  Diagnosis Date   A-fib ([place])    HTN (hypertension)     Past Surgical History:  Procedure Laterality Date   DENTAL SURGERY      Allergies: Patient has no known allergies.  Medications: Prior to Admission medications   Medication Sig Start Date End Date Taking? Authorizing Provider  metoprolol tartrate (LOPRESSOR) 100 MG tablet Take 100 mg by mouth 2 (two) times daily. 07/26/18  Yes [provider]  Nutritional Supplements ([employer] STANDARD 1.4) LIQD 1 Container by Enteral route 5 (five) times daily. Patient taking differently: 1 Container by Enteral route  3 (three) times daily. Taking Ensure 04/08/21 03/21/22 Yes Eppie Gibson, MD  rivaroxaban (XARELTO) 20 MG TABS tablet Take 1 tablet (20 mg total) by mouth daily with supper. 02/14/21  Yes Little Ishikawa, MD  acetaminophen (TYLENOL) 160 MG/5ML suspension Take 30 mLs (960 mg total) by mouth every 6 (six) hours as needed for moderate pain. Patient not taking: Reported on 04/08/2021 02/13/21   Eppie Gibson, MD  docusate sodium (COLACE) 100 MG capsule Take 100 mg by mouth 2 (two) times daily as needed for mild constipation. Patient not taking: Reported on 04/08/2021    [provider]  furosemide (LASIX) 20 MG tablet Take 1 tablet (20 mg total) by mouth daily. Patient not taking: Reported on 04/09/2021 02/12/21 03/14/21  Little Ishikawa, MD  prochlorperazine (COMPAZINE) 10 MG tablet Take 1 tablet (10 mg total) by mouth every 6 (six) hours as needed. Patient not taking: Reported on 04/09/2021 02/18/21   Heilingoetter, Cassandra L, PA-C     Family History  Problem Relation Age of Onset   COPD Mother        Heavy smoker for 59 years   Hypertension Father        uncontrolled    Social History   Socioeconomic History   Marital status: Married    Spouse name: Not on file   Number of children: 2   Years of education: Not on file   Highest education level: Not on file  Occupational History   Not on file  Tobacco Use   Smoking status: Never   Smokeless tobacco: Former    Types: Nurse, children's Use: Former  Substance and Sexual Activity   Alcohol use: Yes  Comment: occ   Drug use: Never   Sexual activity: Not on file  Other Topics Concern   Not on file  Social History Narrative   Not on file   Social Determinants of Health   Financial Resource Strain: Low Risk    Difficulty of Paying Living Expenses: Not hard at all  Food Insecurity: No Food Insecurity   Worried About Running Out of Food in the Last Year: Never true   [place] in the Last  Year: Never true  Transportation Needs: No Transportation Needs   Lack of Transportation (Medical): No   Lack of Transportation (Non-Medical): No  Physical Activity: Not on file  Stress: Not on file  Social Connections: Moderately Integrated   Frequency of Communication with Friends and Family: Twice a week   Frequency of Social Gatherings with Friends and Family: Once a week   Attends Religious Services: Never   Marine scientist or Organizations: No   Attends Music therapist: 1 to 4 times per year   Marital Status: Married   Review of Systems: A 12 point ROS discussed and pertinent positives are indicated in the HPI above.  All other systems are negative.  Review of Systems  Constitutional:  Negative for fever.  HENT:  Negative for congestion.   Respiratory:  Negative for cough and shortness of breath.   Cardiovascular:  Negative for chest pain.  Gastrointestinal:  Negative for abdominal pain.  Musculoskeletal:  Positive for back pain.  Neurological:  Negative for headaches.  Psychiatric/Behavioral:  Negative for behavioral problems and confusion.    Vital Signs: BP (!) 86/74    Pulse 90    Temp 98 F (36.7 C) (Oral)    Ht 6' (1.829 m)    Wt 190 lb (86.2 kg)    SpO2 96%    BMI 25.77 kg/m   Physical Exam Vitals and nursing note reviewed.  Constitutional:      Appearance: He is well-developed.  HENT:     Head: Normocephalic.     Mouth/Throat:     Comments: S/p left side  buccal resection  Cardiovascular:     Rate and Rhythm: Normal rate and regular rhythm.     Heart sounds: Normal heart sounds.  Pulmonary:     Effort: Pulmonary effort is normal.  Musculoskeletal:        General: Normal range of motion.     Cervical back: Normal range of motion.  Skin:    General: Skin is dry.  Neurological:     Mental Status: He is alert and oriented to person, place, and time.    Imaging: No results found.  Labs:  CBC: Recent Labs    02/18/21 1415  02/27/21 0922 03/18/21 0900 04/08/21 0811  WBC 23.0* 26.0* 5.1 11.8*  HGB 10.6* 9.6* 8.1* 9.2*  HCT 31.9* 29.7* 24.9* 28.3*  PLT 287 254 167 215    COAGS: Recent Labs    02/12/21 0515  INR 1.1    BMP: Recent Labs    02/18/21 1415 02/27/21 0922 03/18/21 0900 04/08/21 0811  NA 133* 128* 132* 135  K 4.5 3.5 2.8* 3.4*  CL 98 95* 97* 99  CO2 29 24 25 25   GLUCOSE 112* 147* 120* 141*  BUN 14 17 13 10   CALCIUM 8.8* 9.7 7.9* 8.3*  CREATININE 0.85 0.82 0.67 0.71  GFRNONAA >60 >60 >60 >60    LIVER FUNCTION TESTS: Recent Labs    02/18/21 1415 02/27/21 0922 03/18/21  0900 04/08/21 0811  BILITOT 1.0 0.6 1.4* 1.1  AST 22 30 36 15  ALT 22 31 36 14  ALKPHOS 186* 211* 104 117  PROT 7.1 6.6 5.6* 6.0*  ALBUMIN 2.5* 2.1* 2.1* 2.3*     Assessment and Plan:  80 y.o. male outpatient. History of a fib (on xarelto). 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 He had metastatic disease in October 2022 with metastatic disease involving the lung as well as mediastinal lymphadenopathy and multiple suspicious liver lesion as well as multiple osseous metastasis and right adrenal suspicious metastatic disease.  Team is requesting gastrostomy tube placement for nutrition purposes.   PET scan from 11.15.22 shows stomach is in an accessible percutaneously. Patient is on xarelto. Last dose taken on Sunday 12.18.22. All other medications and labs are within acceptable parameters. NKDA. Patient has been NPO since midnight. Patient drank barium last night to assist with procedure.  Risks and benefits image guided gastrostomy tube placement was discussed with the patient including, but not limited to the need for a barium enema during the  procedure, bleeding, infection, peritonitis and/or damage to adjacent structures.  All of the patient's questions were answered, patient is agreeable to proceed.  Consent signed and in chart.  Thank you for this interesting consult.  I greatly enjoyed meeting Jamarri Vuncannon and look forward to participating in their care.  A copy of this report was sent to the requesting provider on this date.  Electronically Signed: Jacqualine Mau, NP 04/10/2021, 10:36 AM   I spent a total of  30 Minutes   in face to face in clinical consultation, greater than 50% of which was counseling/coordinating care for gastostomy tube placement

## 2021-04-10 NOTE — ED Provider Notes (Signed)
[facility] EMERGENCY DEPARTMENT Provider Note   CSN: [id number] Arrival date & time: 04/10/21  1458     History No chief complaint on file.   Robert [name] is a 80 y.o. male.  HPI Patient presents for an incidental finding of atrial fibrillation with RVR.  He has a history of the same.  He typically takes metoprolol, 100 mg twice daily for rate control.  He is also on anticoagulation (Xarelto).  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  He has since been undergoing chemotherapy and radiation.  He has developed difficulty with p.o. intake and, for this reason, he was scheduled to get a G-tube placed today.  At IR, he was found to be in atrial fibrillation with RVR.  He states that he was not symptomatic from this.  Currently, patient states that he feels normal.  He did stop taking both his blood thinner and his rate control medication on Sunday (3 days ago) in preparation of his procedure today.  Due to his rapid heart rate, patient was not able to undergo the procedure.  He was brought to the ED instead.    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[place]) 10/22/2018   Non-rheumatic mitral regurgitation 10/22/2018   Obesity (BMI 30-39.9) 10/22/2018    Past Surgical History:  Procedure Laterality Date   DENTAL SURGERY     IR FLUORO RM 30-60 MIN  04/10/2021       Family History  Problem Relation Age of Onset   COPD Mother        Heavy  smoker for 5 years   Hypertension Father        uncontrolled    Social History   Tobacco Use   Smoking status: Never   Smokeless tobacco: Former    Types: Nurse, children's Use: Former  Substance Use Topics   Alcohol use: Yes    Comment: occ   Drug use: Never    Home Medications Prior to Admission medications   Medication Sig Start Date End Date Taking? Authorizing Provider  acetaminophen (TYLENOL) 160 MG/5ML suspension Take 30 mLs (960 mg total) by mouth every 6 (six) hours as needed for moderate pain. 02/13/21  Yes Eppie Gibson, MD  docusate sodium (COLACE) 100 MG capsule Take 100 mg by mouth 2 (two) times daily as needed for mild constipation.   Yes [provider]  furosemide (LASIX) 20 MG tablet Take 1 tablet (20 mg total) by mouth daily. Patient taking differently: Take 20 mg by mouth daily as needed for fluid or edema. 02/12/21 04/10/21 Yes Little Ishikawa, MD  metoprolol tartrate (LOPRESSOR) 100 MG tablet Take 100 mg by mouth 2 (two) times daily. 07/26/18  Yes [provider]  prochlorperazine (COMPAZINE) 10 MG tablet Take 1 tablet (10 mg total) by mouth every 6 (six) hours as needed. 02/18/21  Yes Heilingoetter, Cassandra  L, PA-C  rivaroxaban (XARELTO) 20 MG TABS tablet Take 1 tablet (20 mg total) by mouth daily with supper. 02/14/21  Yes Little Ishikawa, MD  Nutritional Supplements (K[employer]AR[employer] STANDARD 1.4) LIQD 1 Container by Enteral route 5 (five) times daily. Patient taking differently: 1 Container by Enteral route 3 (three) times daily. Taking Ensure 04/08/21 03/21/22  Eppie Gibson, MD    Allergies    Patient has no known allergies.  Review of Systems   Review of Systems  Constitutional:  Positive for appetite change and fatigue. Negative for activity change, chills and fever.  HENT:  Negative for congestion, ear pain and sore throat.   Eyes:  Negative for pain and visual disturbance.  Respiratory:  Negative for cough, chest  tightness and shortness of breath.   Cardiovascular:  Negative for chest pain, palpitations and leg swelling.  Gastrointestinal:  Negative for abdominal pain, diarrhea, nausea and vomiting.  Genitourinary:  Negative for dysuria, flank pain and hematuria.  Musculoskeletal:  Negative for arthralgias, back pain, myalgias and neck pain.  Skin:  Negative for color change and rash.  Neurological:  Negative for dizziness, seizures, syncope, weakness, light-headedness, numbness and headaches.  Psychiatric/Behavioral:  Negative for confusion and decreased concentration.   All other systems reviewed and are negative.  Physical Exam Updated Vital Signs BP 105/62    Pulse 89    Temp 98.7 F (37.1 C) (Oral)    Resp 19    SpO2 91%   Physical Exam Vitals and nursing note reviewed.  Constitutional:      General: He is not in acute distress.    Appearance: He is well-developed. He is ill-appearing (Chronically). He is not toxic-appearing or diaphoretic.  HENT:     Head: Normocephalic and atraumatic.     Right Ear: External ear normal.     Left Ear: External ear normal.     Nose: Nose normal.     Mouth/Throat:     Mouth: Mucous membranes are dry.     Comments: S/p prior jaw surgery.  Baseline trismus. Eyes:     General: No scleral icterus.    Extraocular Movements: Extraocular movements intact.     Conjunctiva/sclera: Conjunctivae normal.  Cardiovascular:     Rate and Rhythm: Tachycardia present. Rhythm irregular.     Heart sounds: No murmur heard. Pulmonary:     Effort: Pulmonary effort is normal. No respiratory distress.     Breath sounds: Normal breath sounds. No wheezing or rales.  Chest:     Chest wall: No tenderness.  Abdominal:     General: Abdomen is flat.     Palpations: Abdomen is soft.     Tenderness: There is no abdominal tenderness.  Musculoskeletal:        General: No swelling.     Cervical back: Neck supple.     Right lower leg: No edema.     Left lower leg: No edema.   Skin:    General: Skin is warm and dry.     Capillary Refill: Capillary refill takes less than 2 seconds.     Coloration: Skin is not jaundiced or pale.  Neurological:     General: No focal deficit present.     Mental Status: He is alert and oriented to person, place, and time.     Cranial Nerves: No cranial nerve deficit.     Sensory: No sensory deficit.     Motor: No weakness.  Psychiatric:        Mood and  Affect: Mood normal.        Behavior: Behavior normal.        Thought Content: Thought content normal.        Judgment: Judgment normal.    ED Results / Procedures / Treatments   Labs (all labs ordered are listed, but only abnormal results are displayed) Labs Reviewed  BASIC METABOLIC PANEL - Abnormal; Notable for the following components:      Result Value   Sodium 133 (*)    Calcium 7.4 (*)    All other components within normal limits  CBC - Abnormal; Notable for the following components:   RBC 2.83 (*)    Hemoglobin 8.2 (*)    HCT 26.6 (*)    RDW 22.4 (*)    Platelets 141 (*)    All other components within normal limits  MAGNESIUM    EKG EKG Interpretation  Date/Time:  Wednesday April 10 2021 14:59:34 EST Ventricular Rate:  132 PR Interval:    QRS Duration: 94 QT Interval:  297 QTC Calculation: 441 R Axis:   27 Text Interpretation: Atrial fibrillation with rapid V-rate Borderline low voltage, extremity leads RSR' in V1 or V2, probably normal variant Confirmed by Wynona Dove ([id number]) on 04/10/2021 6:03:07 PM  Radiology IR Fluoro Rm 30-60 Min  Result Date: 04/10/2021 CLINICAL DATA:  80 year old male with history of metastatic squamous cell carcinoma. Percutaneous gastrostomy tube requested for supplemental nutrition. EXAM: IR FLUORO RM 0-60 MIN ANESTHESIA/SEDATION: None. MEDICATIONS: None. CONTRAST:  None. PROCEDURE: None. COMPLICATIONS: None immediate FINDINGS: Immediately prior to procedure, the patient was noted to be in atrial fibrillation with rapid  ventricular response. Systolic blood pressures were in the 90s, similar to his recorded baseline. The patient was asymptomatic. Patient was given 500 mL normal saline bolus without change in atrial fibrillation. An EKG was obtained. The patient was then transferred to the emergency department for further evaluation. IMPRESSION: Gastrostomy tube placement aborted due to new onset atrial fibrillation with RVR. The patient was transferred to the emergency department in stable condition. PLAN: Gastrostomy tube placement on an outpatient basis can be rescheduled at any time. Ruthann Cancer, MD [facility] Radiology Electronically Signed   By: Ruthann Cancer M.D.   On: 04/10/2021 16:57   DG Chest Port 1 View  Result Date: 04/10/2021 CLINICAL DATA:  atrial fib.  Hypertension EXAM: PORTABLE CHEST 1 VIEW COMPARISON:  CT chest 02/09/2021, CT PET 03/04/2021 FINDINGS: The heart and mediastinal contours are within normal limits. Low lung volumes. Biapical pleural/pulmonary scarring. Patchy airspace opacity and nodularities consistent with known non-small cell lung cancer question 1 cm cavitary lesion at the right base. No pulmonary edema. Blunting of bilateral costophrenic angles likely representing trace pleural effusions. No pneumothorax. No acute osseous abnormality. IMPRESSION: 1. Low lung volumes with patchy airspace opacity and nodularities consistent with known non-small cell lung cancer. Superimposed infection/inflammation not excluded. 2. Question 1 cm cavitary lesion at the right base. Electronically Signed   By: Iven Finn M.D.   On: 04/10/2021 15:40    Procedures Procedures   Medications Ordered in ED Medications  metoprolol tartrate (LOPRESSOR) injection 5 mg (5 mg Intravenous Given 04/10/21 1614)  lactated ringers bolus 1,000 mL (1,000 mLs Intravenous New Bag/Given 04/10/21 1538)  metoprolol tartrate (LOPRESSOR) tablet 50 mg (50 mg Oral Given 04/10/21  1638)    ED Course  I have reviewed the triage vital signs and the nursing notes.  Pertinent labs & imaging results that were available  during my care of the patient were reviewed by me and considered in my medical decision making (see chart for details).  Clinical Course as of 04/10/21 1859  Wed Apr 10, 2021  1631 Afib, metastatic cancer head/neck. Supposed to get G tube today, he didn't take xarelto or metop for last 4 days in preparation for  this? His BP normally runs around 90-110 SBP. If HDS can go home?  [SG]    Clinical Course User Index [SG] Jeanell Sparrow, DO   MDM Rules/Calculators/A&P                         CRITICAL CARE Performed by: Godfrey Pick   Total critical care time: 35 minutes  Critical care time was exclusive of separately billable procedures and treating other patients.  Critical care was necessary to treat or prevent imminent or life-threatening deterioration.  Critical care was time spent personally by me on the following activities: development of treatment plan with patient and/or surrogate as well as nursing, discussions with consultants, evaluation of patient's response to treatment, examination of patient, obtaining history from patient or surrogate, ordering and performing treatments and interventions, ordering and review of laboratory studies, ordering and review of radiographic studies, pulse oximetry and re-evaluation of patient's condition.   80 year old male with history of atrial fibrillation, normally rate controlled with metoprolol, presents for atrial fibrillation with RVR.  This was found incidentally during his preoperative work-up for placement of a G-tube.  Patient denies any recent symptoms.  He does state that he discontinued use of his metoprolol 3 days ago in preparation of his surgical procedure today.  EKG on arrival in the ED shows A. fib with RVR and heart rate in the range of 140s.  I suspect this is from discontinuing his metoprolol.   Patient has also had poor p.o. intake and weight loss.  He is likely dehydrated.  Blood pressure was found to be low in the range of 80s over 60s.  IV fluids were given.  Patient was given 3 doses of 5 mg IV metoprolol.  He subsequently had improvement in heart rate to the range of 100s.  Blood pressure also improved, but remains soft.  Per chart review, patient does appear to have soft low pressures at baseline.  Oral dose of metoprolol was given.  Laboratory work-up showed baseline anemia and normal electrolytes.  Patient and wife, who, nursing at bedside, stated that they would prefer to go home, rather than undergo hospital admission.  Patient will require reassessment to ensure sustained improvement in vital signs.  Care of patient was signed out to oncoming ED provider.  Final Clinical Impression(s) / ED Diagnoses Final diagnoses:  Atrial fibrillation with RVR [facility])    Rx / DC Orders ED Discharge Orders     None        Godfrey Pick, MD 04/10/21 1859

## 2021-04-10 NOTE — ED Triage Notes (Signed)
Pt here from IR. Pt was there for G -tube placement, but brought to the ED d/t AFIB RVR and hypotension. Hx Afib. Stopped taking metoprolol and xerotol d/t the procedure. Pt appears dehydrated.

## 2021-04-10 NOTE — Discharge Instructions (Addendum)
Please continue holding your Xarelto.  You may take your metoprolol as you normally would.  Please call radiology department tomorrow morning to reschedule procedure  Return to emergency department for any worsening or worrisome symptoms.

## 2021-04-11 ENCOUNTER — Encounter: Payer: Medicare Other | Admitting: Nutrition

## 2021-04-11 ENCOUNTER — Telehealth: Payer: Self-pay | Admitting: Medical Oncology

## 2021-04-11 ENCOUNTER — Ambulatory Visit: Payer: Medicare Other

## 2021-04-11 ENCOUNTER — Inpatient Hospital Stay: Payer: Medicare Other

## 2021-04-11 ENCOUNTER — Telehealth: Payer: Self-pay | Admitting: *Deleted

## 2021-04-11 ENCOUNTER — Other Ambulatory Visit: Payer: Self-pay

## 2021-04-11 VITALS — BP 88/58 | HR 74 | Temp 98.9°F | Resp 20

## 2021-04-11 DIAGNOSIS — D649 Anemia, unspecified: Secondary | ICD-10-CM | POA: Diagnosis not present

## 2021-04-11 DIAGNOSIS — C3490 Malignant neoplasm of unspecified part of unspecified bronchus or lung: Secondary | ICD-10-CM

## 2021-04-11 DIAGNOSIS — C801 Malignant (primary) neoplasm, unspecified: Secondary | ICD-10-CM | POA: Diagnosis not present

## 2021-04-11 DIAGNOSIS — Z5112 Encounter for antineoplastic immunotherapy: Secondary | ICD-10-CM | POA: Diagnosis not present

## 2021-04-11 DIAGNOSIS — C7951 Secondary malignant neoplasm of bone: Secondary | ICD-10-CM | POA: Diagnosis not present

## 2021-04-11 DIAGNOSIS — C7800 Secondary malignant neoplasm of unspecified lung: Secondary | ICD-10-CM | POA: Diagnosis not present

## 2021-04-11 DIAGNOSIS — Z5111 Encounter for antineoplastic chemotherapy: Secondary | ICD-10-CM | POA: Diagnosis not present

## 2021-04-11 MED ORDER — PEGFILGRASTIM-CBQV 6 MG/0.6ML ~~LOC~~ SOSY
6.0000 mg | PREFILLED_SYRINGE | Freq: Once | SUBCUTANEOUS | Status: AC
  Administered 2021-04-11: 16:00:00 6 mg via SUBCUTANEOUS
  Filled 2021-04-11: qty 0.6

## 2021-04-11 NOTE — Progress Notes (Signed)
Patient Name: Robert [name] MRN: [id number] DOB: 27-Mar-1941 Referring Physician: Corey Skains (Profile Not Attached) Date of Service: 02/27/2021 [facility], [place]                                                        End Of Treatment Note  Diagnoses: C79.51-Secondary malignant neoplasm of bone  Cancer Staging:  Cancer Staging  Cancer of lower gum (HCC) Staging form: Oral Cavity, AJCC 8th Edition - Pathologic stage from 10/05/2020: Stage IVA (pT4a, pN2a, cM0) - Signed by Lonie Peak, MD on 10/05/2020 Stage prefix: Initial diagnosis Histologic grade (G): G2 Histologic grading system: 3 grade system Presence of extranodal extension: Present Perineural invasion (PNI): Present  Now with Stage IV disease  Intent: Palliative  Radiation Treatment Dates: 02/14/2021 through 02/27/2021 Site Technique Total Dose (Gy) Dose per Fx (Gy) Completed Fx Beam Energies  Back: Spine_T_L 3D 30/30 3 10/10 6X, 10X, 15X  Thorax: Chest_sternum specialPort 8/8 8 1/1 12E  Scapula, Left: Chest_Lt_ScapRib 3D 30/30 3 10/10 10X   Narrative: The patient tolerated radiation therapy relatively well.   Plan: The patient will follow-up with radiation oncology in 7mo . -----------------------------------  Lonie Peak, MD

## 2021-04-11 NOTE — Telephone Encounter (Signed)
Per Dr Julien Nordmann , I instructed pt to restart xarelto. Pt and his wife confirmed injection appt today at 1530.

## 2021-04-11 NOTE — Patient Instructions (Signed)

## 2021-04-11 NOTE — Progress Notes (Signed)
Per flush nurse, [name],  pt BP low at 87/52 and repeat 88/58 pulse in 70-80's. Asymptomatic except fatigue. Pt took lopressor 2 hours ago.  Per Dr Julien Nordmann , pt may receive Injection today and hold his second dose of Lopresser tonight. Wife will monitor BP at home. [name] will tell [name]'s instructions .

## 2021-04-11 NOTE — Progress Notes (Signed)
Pt came in with blood pressure of 87/52 and recheck at 88/58. Only symptoms pt has is fatigue/weakness. He stated he took his lopressor medication 2 hours ago and Abelina Bachelor RN was made aware and instructed this LPN to tell pt to hold second dose of Lopressor tonight.

## 2021-04-11 NOTE — Telephone Encounter (Signed)
Patients daughter called to report that patient would not be able to attend his injection appointment today.  States he is too weak to attend.  She was wondering if this injection could be skipped or does it need to be rescheduled.  The visit was for [facility].  He has received this medication twice previously.

## 2021-04-12 ENCOUNTER — Telehealth: Payer: Self-pay

## 2021-04-12 ENCOUNTER — Other Ambulatory Visit (HOSPITAL_COMMUNITY): Payer: Self-pay | Admitting: Internal Medicine

## 2021-04-12 DIAGNOSIS — D491 Neoplasm of unspecified behavior of respiratory system: Secondary | ICD-10-CM

## 2021-04-12 NOTE — Progress Notes (Signed)
Oncology Nurse Navigator Documentation   I left a voicemail with both Mr and Mrs Ancrum informing them that I have rescheduled his PEG tube for 04/17/21 arrival time 10:00 at [facility]. They were made aware that he needs to drink the barium starting at 6 pm on 12/27 and then have nothing to eat or drink after midnight. I also told them that he needs to hold his Xarelto starting on Monday 12/26. I left my direct contact information to call me if they had any questions.  Harlow Asa RN, BSN, OCN Head & Neck Oncology Nurse [place] at [facility] Phone # [phone]  Fax # [phone]

## 2021-04-12 NOTE — Telephone Encounter (Signed)
Pt wife called saying that his peg tube placement was re-scheduled for the 28th. Harlow Asa, RN, made aware. No further questions at this time.

## 2021-04-16 ENCOUNTER — Other Ambulatory Visit: Payer: Self-pay | Admitting: Radiology

## 2021-04-17 ENCOUNTER — Other Ambulatory Visit: Payer: Self-pay

## 2021-04-17 ENCOUNTER — Telehealth: Payer: Self-pay | Admitting: *Deleted

## 2021-04-17 ENCOUNTER — Inpatient Hospital Stay (HOSPITAL_COMMUNITY)
Admission: EM | Admit: 2021-04-17 | Discharge: 2021-04-26 | DRG: 308 | Disposition: A | Discharge status: Hospice | Payer: Medicare Other | Attending: Internal Medicine | Admitting: Internal Medicine

## 2021-04-17 ENCOUNTER — Ambulatory Visit (HOSPITAL_COMMUNITY)
Admission: RE | Admit: 2021-04-17 | Discharge: 2021-04-17 | Disposition: A | Discharge status: Home / Self Care | Payer: Medicare Other | Source: Ambulatory Visit | Attending: Internal Medicine | Admitting: Internal Medicine

## 2021-04-17 ENCOUNTER — Emergency Department (HOSPITAL_COMMUNITY): Payer: Medicare Other

## 2021-04-17 VITALS — BP 104/63 | HR 150 | Temp 98.0°F | Resp 18 | Ht 72.0 in | Wt 185.0 lb

## 2021-04-17 DIAGNOSIS — C7951 Secondary malignant neoplasm of bone: Secondary | ICD-10-CM | POA: Diagnosis not present

## 2021-04-17 DIAGNOSIS — Z20822 Contact with and (suspected) exposure to covid-19: Secondary | ICD-10-CM | POA: Diagnosis not present

## 2021-04-17 DIAGNOSIS — R1310 Dysphagia, unspecified: Secondary | ICD-10-CM | POA: Insufficient documentation

## 2021-04-17 DIAGNOSIS — Z515 Encounter for palliative care: Secondary | ICD-10-CM

## 2021-04-17 DIAGNOSIS — C787 Secondary malignant neoplasm of liver and intrahepatic bile duct: Secondary | ICD-10-CM | POA: Diagnosis present

## 2021-04-17 DIAGNOSIS — C7971 Secondary malignant neoplasm of right adrenal gland: Secondary | ICD-10-CM | POA: Diagnosis present

## 2021-04-17 DIAGNOSIS — I110 Hypertensive heart disease with heart failure: Secondary | ICD-10-CM | POA: Diagnosis not present

## 2021-04-17 DIAGNOSIS — Z7901 Long term (current) use of anticoagulants: Secondary | ICD-10-CM | POA: Insufficient documentation

## 2021-04-17 DIAGNOSIS — I4891 Unspecified atrial fibrillation: Secondary | ICD-10-CM

## 2021-04-17 DIAGNOSIS — E878 Other disorders of electrolyte and fluid balance, not elsewhere classified: Secondary | ICD-10-CM

## 2021-04-17 DIAGNOSIS — Z6825 Body mass index (BMI) 25.0-25.9, adult: Secondary | ICD-10-CM | POA: Insufficient documentation

## 2021-04-17 DIAGNOSIS — E876 Hypokalemia: Secondary | ICD-10-CM | POA: Diagnosis not present

## 2021-04-17 DIAGNOSIS — C7802 Secondary malignant neoplasm of left lung: Secondary | ICD-10-CM | POA: Diagnosis present

## 2021-04-17 DIAGNOSIS — I5032 Chronic diastolic (congestive) heart failure: Secondary | ICD-10-CM | POA: Diagnosis present

## 2021-04-17 DIAGNOSIS — I10 Essential (primary) hypertension: Secondary | ICD-10-CM | POA: Insufficient documentation

## 2021-04-17 DIAGNOSIS — Z87891 Personal history of nicotine dependence: Secondary | ICD-10-CM | POA: Diagnosis not present

## 2021-04-17 DIAGNOSIS — I340 Nonrheumatic mitral (valve) insufficiency: Secondary | ICD-10-CM | POA: Diagnosis present

## 2021-04-17 DIAGNOSIS — C7889 Secondary malignant neoplasm of other digestive organs: Secondary | ICD-10-CM | POA: Diagnosis not present

## 2021-04-17 DIAGNOSIS — R627 Adult failure to thrive: Secondary | ICD-10-CM | POA: Diagnosis present

## 2021-04-17 DIAGNOSIS — C069 Malignant neoplasm of mouth, unspecified: Secondary | ICD-10-CM | POA: Diagnosis not present

## 2021-04-17 DIAGNOSIS — Z8249 Family history of ischemic heart disease and other diseases of the circulatory system: Secondary | ICD-10-CM

## 2021-04-17 DIAGNOSIS — Z66 Do not resuscitate: Secondary | ICD-10-CM | POA: Diagnosis not present

## 2021-04-17 DIAGNOSIS — R531 Weakness: Secondary | ICD-10-CM | POA: Diagnosis not present

## 2021-04-17 DIAGNOSIS — L89150 Pressure ulcer of sacral region, unstageable: Secondary | ICD-10-CM | POA: Diagnosis present

## 2021-04-17 DIAGNOSIS — C7800 Secondary malignant neoplasm of unspecified lung: Secondary | ICD-10-CM | POA: Insufficient documentation

## 2021-04-17 DIAGNOSIS — D61810 Antineoplastic chemotherapy induced pancytopenia: Secondary | ICD-10-CM | POA: Diagnosis not present

## 2021-04-17 DIAGNOSIS — C031 Malignant neoplasm of lower gum: Secondary | ICD-10-CM

## 2021-04-17 DIAGNOSIS — C779 Secondary and unspecified malignant neoplasm of lymph node, unspecified: Secondary | ICD-10-CM | POA: Insufficient documentation

## 2021-04-17 DIAGNOSIS — C039 Malignant neoplasm of gum, unspecified: Secondary | ICD-10-CM | POA: Insufficient documentation

## 2021-04-17 DIAGNOSIS — I4811 Longstanding persistent atrial fibrillation: Secondary | ICD-10-CM | POA: Diagnosis not present

## 2021-04-17 DIAGNOSIS — I517 Cardiomegaly: Secondary | ICD-10-CM | POA: Diagnosis not present

## 2021-04-17 DIAGNOSIS — D491 Neoplasm of unspecified behavior of respiratory system: Secondary | ICD-10-CM

## 2021-04-17 DIAGNOSIS — E871 Hypo-osmolality and hyponatremia: Secondary | ICD-10-CM | POA: Diagnosis present

## 2021-04-17 DIAGNOSIS — E46 Unspecified protein-calorie malnutrition: Secondary | ICD-10-CM | POA: Diagnosis not present

## 2021-04-17 DIAGNOSIS — E440 Moderate protein-calorie malnutrition: Secondary | ICD-10-CM | POA: Diagnosis present

## 2021-04-17 DIAGNOSIS — Z7401 Bed confinement status: Secondary | ICD-10-CM

## 2021-04-17 DIAGNOSIS — Z8589 Personal history of malignant neoplasm of other organs and systems: Secondary | ICD-10-CM

## 2021-04-17 DIAGNOSIS — C7989 Secondary malignant neoplasm of other specified sites: Secondary | ICD-10-CM | POA: Diagnosis not present

## 2021-04-17 DIAGNOSIS — M2550 Pain in unspecified joint: Secondary | ICD-10-CM | POA: Diagnosis not present

## 2021-04-17 DIAGNOSIS — N179 Acute kidney failure, unspecified: Secondary | ICD-10-CM | POA: Diagnosis present

## 2021-04-17 DIAGNOSIS — R634 Abnormal weight loss: Secondary | ICD-10-CM

## 2021-04-17 DIAGNOSIS — Z8673 Personal history of transient ischemic attack (TIA), and cerebral infarction without residual deficits: Secondary | ICD-10-CM

## 2021-04-17 DIAGNOSIS — I509 Heart failure, unspecified: Secondary | ICD-10-CM | POA: Diagnosis not present

## 2021-04-17 DIAGNOSIS — I959 Hypotension, unspecified: Secondary | ICD-10-CM | POA: Diagnosis present

## 2021-04-17 DIAGNOSIS — Z7189 Other specified counseling: Secondary | ICD-10-CM | POA: Diagnosis not present

## 2021-04-17 DIAGNOSIS — I4821 Permanent atrial fibrillation: Principal | ICD-10-CM | POA: Diagnosis present

## 2021-04-17 DIAGNOSIS — Z711 Person with feared health complaint in whom no diagnosis is made: Secondary | ICD-10-CM | POA: Diagnosis not present

## 2021-04-17 DIAGNOSIS — R638 Other symptoms and signs concerning food and fluid intake: Secondary | ICD-10-CM | POA: Diagnosis not present

## 2021-04-17 DIAGNOSIS — T451X5A Adverse effect of antineoplastic and immunosuppressive drugs, initial encounter: Secondary | ICD-10-CM | POA: Diagnosis present

## 2021-04-17 DIAGNOSIS — L0211 Cutaneous abscess of neck: Secondary | ICD-10-CM | POA: Diagnosis not present

## 2021-04-17 DIAGNOSIS — C771 Secondary and unspecified malignant neoplasm of intrathoracic lymph nodes: Secondary | ICD-10-CM | POA: Diagnosis not present

## 2021-04-17 DIAGNOSIS — C7801 Secondary malignant neoplasm of right lung: Secondary | ICD-10-CM | POA: Diagnosis not present

## 2021-04-17 DIAGNOSIS — C760 Malignant neoplasm of head, face and neck: Secondary | ICD-10-CM | POA: Diagnosis not present

## 2021-04-17 DIAGNOSIS — Z923 Personal history of irradiation: Secondary | ICD-10-CM

## 2021-04-17 DIAGNOSIS — J90 Pleural effusion, not elsewhere classified: Secondary | ICD-10-CM | POA: Diagnosis not present

## 2021-04-17 LAB — CBC WITH DIFFERENTIAL/PLATELET
Abs Immature Granulocytes: 0.02 10*3/uL (ref 0.00–0.07)
Basophils Absolute: 0 10*3/uL (ref 0.0–0.1)
Basophils Relative: 1 %
Eosinophils Absolute: 0 10*3/uL (ref 0.0–0.5)
Eosinophils Relative: 1 %
HCT: 18.7 % — ABNORMAL LOW (ref 39.0–52.0)
Hemoglobin: 6.2 g/dL — CL (ref 13.0–17.0)
Immature Granulocytes: 1 %
Lymphocytes Relative: 9 %
Lymphs Abs: 0.1 10*3/uL — ABNORMAL LOW (ref 0.7–4.0)
MCH: 29.7 pg (ref 26.0–34.0)
MCHC: 33.2 g/dL (ref 30.0–36.0)
MCV: 89.5 fL (ref 80.0–100.0)
Monocytes Absolute: 0.3 10*3/uL (ref 0.1–1.0)
Monocytes Relative: 20 %
Neutro Abs: 1 10*3/uL — ABNORMAL LOW (ref 1.7–7.7)
Neutrophils Relative %: 68 %
Platelets: 60 10*3/uL — ABNORMAL LOW (ref 150–400)
RBC: 2.09 MIL/uL — ABNORMAL LOW (ref 4.22–5.81)
RDW: 20.3 % — ABNORMAL HIGH (ref 11.5–15.5)
WBC: 1.4 10*3/uL — CL (ref 4.0–10.5)
nRBC: 0 % (ref 0.0–0.2)

## 2021-04-17 LAB — CBC
HCT: 19.9 % — ABNORMAL LOW (ref 39.0–52.0)
Hemoglobin: 6.5 g/dL — CL (ref 13.0–17.0)
MCH: 28.5 pg (ref 26.0–34.0)
MCHC: 32.7 g/dL (ref 30.0–36.0)
MCV: 87.3 fL (ref 80.0–100.0)
Platelets: 68 10*3/uL — ABNORMAL LOW (ref 150–400)
RBC: 2.28 MIL/uL — ABNORMAL LOW (ref 4.22–5.81)
RDW: 20.4 % — ABNORMAL HIGH (ref 11.5–15.5)
WBC: 1.4 10*3/uL — CL (ref 4.0–10.5)
nRBC: 0 % (ref 0.0–0.2)

## 2021-04-17 LAB — COMPREHENSIVE METABOLIC PANEL
ALT: 18 U/L (ref 0–44)
AST: 19 U/L (ref 15–41)
Albumin: 2.1 g/dL — ABNORMAL LOW (ref 3.5–5.0)
Alkaline Phosphatase: 81 U/L (ref 38–126)
Anion gap: 11 (ref 5–15)
BUN: 17 mg/dL (ref 8–23)
CO2: 24 mmol/L (ref 22–32)
Calcium: 7.4 mg/dL — ABNORMAL LOW (ref 8.9–10.3)
Chloride: 91 mmol/L — ABNORMAL LOW (ref 98–111)
Creatinine, Ser: 0.65 mg/dL (ref 0.61–1.24)
GFR, Estimated: >60 mL/min (ref >60)
Glucose, Bld: 95 mg/dL (ref 70–99)
Potassium: 2.8 mmol/L — ABNORMAL LOW (ref 3.5–5.1)
Sodium: 126 mmol/L — ABNORMAL LOW (ref 135–145)
Total Bilirubin: 1.6 mg/dL — ABNORMAL HIGH (ref 0.3–1.2)
Total Protein: 5 g/dL — ABNORMAL LOW (ref 6.5–8.1)

## 2021-04-17 LAB — VITAMIN B12: Vitamin B-12: 859 pg/mL (ref 180–914)

## 2021-04-17 LAB — PROTIME-INR
INR: 1.2 (ref 0.8–1.2)
INR: 1.3 — ABNORMAL HIGH (ref 0.8–1.2)
Prothrombin Time: 14.8 seconds (ref 11.4–15.2)
Prothrombin Time: 15.8 seconds — ABNORMAL HIGH (ref 11.4–15.2)

## 2021-04-17 LAB — IRON AND TIBC
Iron: 45 ug/dL (ref 45–182)
Saturation Ratios: 35 % (ref 17.9–39.5)
TIBC: 129 ug/dL — ABNORMAL LOW (ref 250–450)
UIBC: 84 ug/dL

## 2021-04-17 LAB — RETICULOCYTES
Immature Retic Fract: 36.6 % — ABNORMAL HIGH (ref 2.3–15.9)
RBC.: 2.09 MIL/uL — ABNORMAL LOW (ref 4.22–5.81)
Retic Count, Absolute: 38.9 10*3/uL (ref 19.0–186.0)
Retic Ct Pct: 1.9 % (ref 0.4–3.1)

## 2021-04-17 LAB — FOLATE: Folate: 6.3 ng/mL (ref >5.9)

## 2021-04-17 LAB — POC OCCULT BLOOD, ED: Fecal Occult Bld: NEGATIVE

## 2021-04-17 LAB — FERRITIN: Ferritin: 986 ng/mL — ABNORMAL HIGH (ref 24–336)

## 2021-04-17 LAB — RESP PANEL BY RT-PCR (FLU A&B, COVID) ARPGX2
Influenza A by PCR: NEGATIVE
Influenza B by PCR: NEGATIVE
SARS Coronavirus 2 by RT PCR: NEGATIVE

## 2021-04-17 LAB — PREPARE RBC (CROSSMATCH)

## 2021-04-17 LAB — MAGNESIUM: Magnesium: 1.3 mg/dL — ABNORMAL LOW (ref 1.7–2.4)

## 2021-04-17 IMAGING — DX DG CHEST 1V PORT
1 series · 1 of 1 positions shown · non-contrast
Comparison: One view chest x-ray [DATE]

CLINICAL DATA: Malnutrition.

EXAM:
PORTABLE CHEST 1 VIEW

[chest]
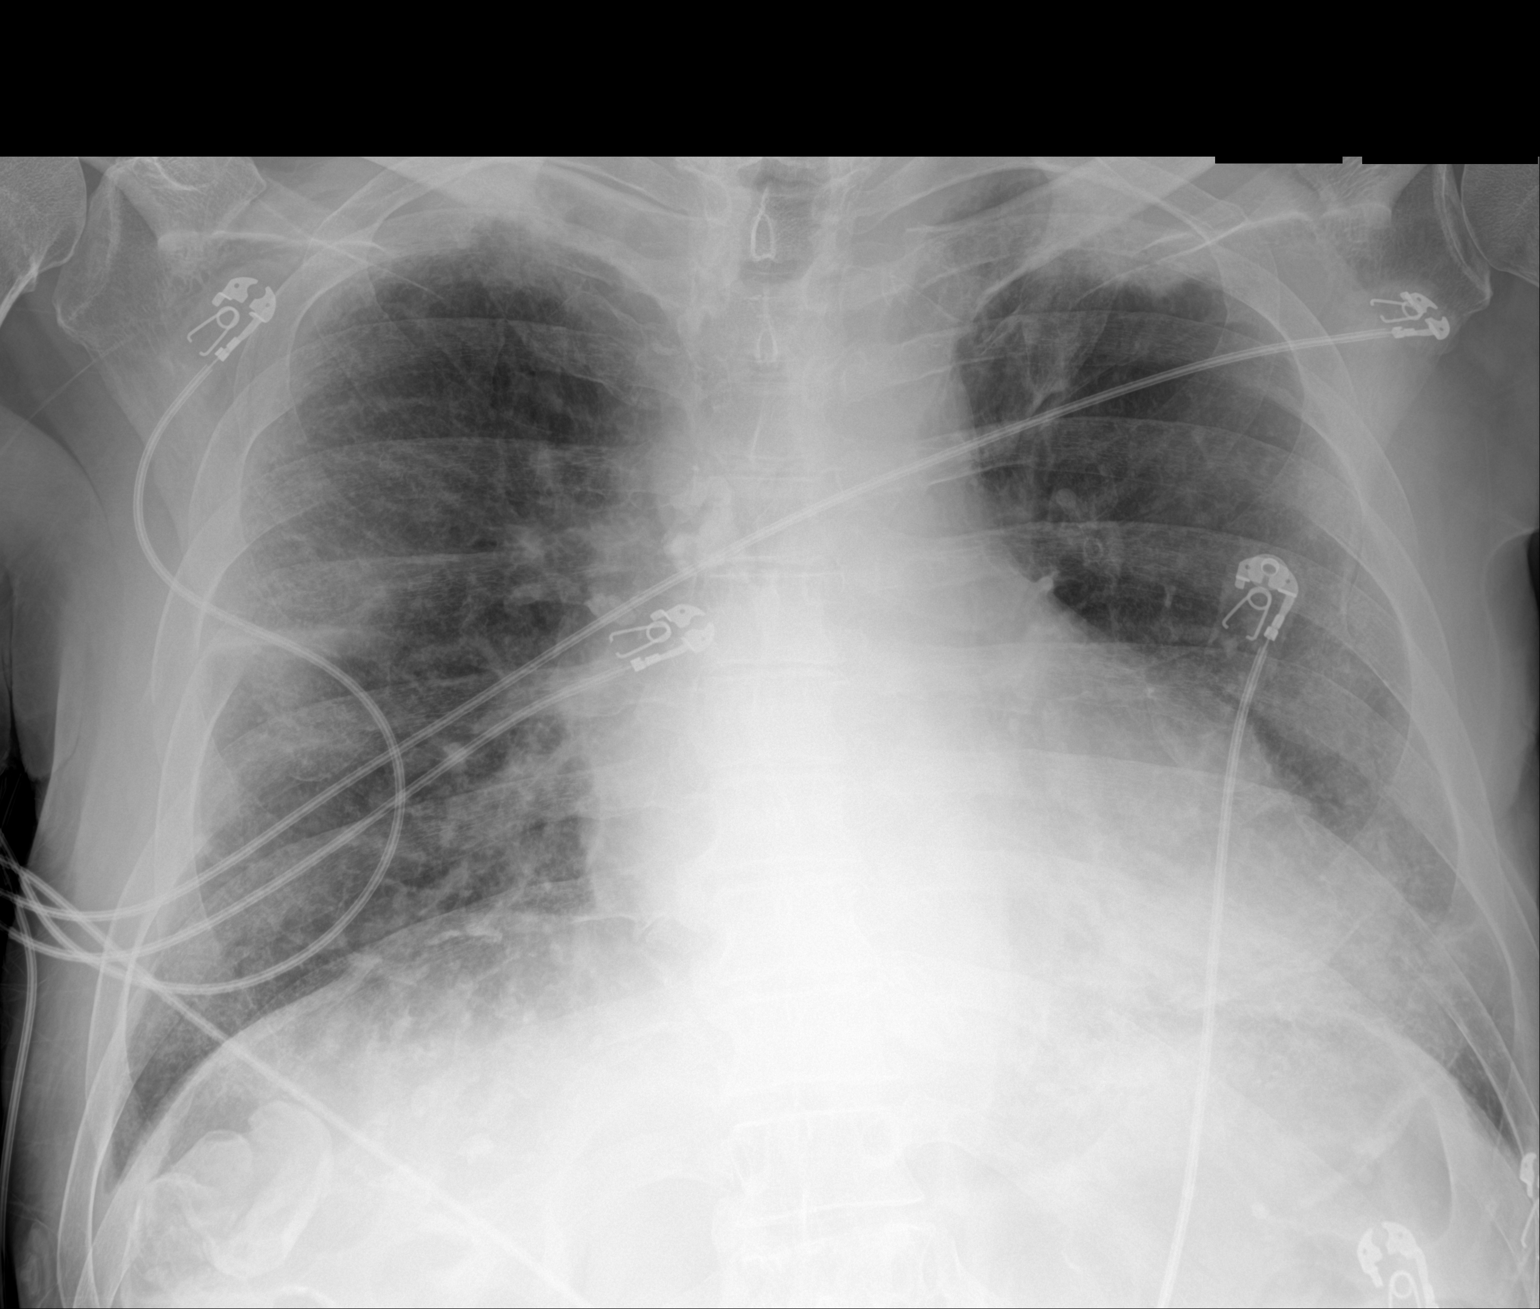

[1 of 1 positions shown; findings below may reference images not displayed]

FINDINGS: The heart is enlarged. Mild edema again noted. Small effusions are
present. Patchy airspace opacities are again noted in the right mid
lung and left lower lobe.
IMPRESSION: 1. Stable cardiomegaly and mild edema.
2. Small bilateral pleural effusions.
3. Patchy bilateral airspace disease likely reflects atelectasis.
Infection is not excluded.

## 2021-04-17 MED ORDER — METOPROLOL TARTRATE 5 MG/5ML IV SOLN
2.5000 mg | Freq: Four times a day (QID) | INTRAVENOUS | Status: DC | PRN
  Administered 2021-04-18 – 2021-04-19 (×2): 2.5 mg via INTRAVENOUS
  Filled 2021-04-17 (×2): qty 5

## 2021-04-17 MED ORDER — SODIUM CHLORIDE 0.9 % IV SOLN: 10.0000 mL/h | Freq: Once | INTRAVENOUS | Status: DC

## 2021-04-17 MED ORDER — DOCUSATE SODIUM 100 MG PO CAPS: 100.0000 mg | ORAL_CAPSULE | Freq: Every day | ORAL | Status: DC | PRN

## 2021-04-17 MED ORDER — POTASSIUM CHLORIDE CRYS ER 20 MEQ PO TBCR
40.0000 meq | EXTENDED_RELEASE_TABLET | Freq: Once | ORAL | Status: DC
  Filled 2021-04-17: qty 2

## 2021-04-17 MED ORDER — CEFAZOLIN SODIUM-DEXTROSE 2-4 GM/100ML-% IV SOLN: 2.0000 g | Freq: Once | INTRAVENOUS | Status: DC

## 2021-04-17 MED ORDER — ENSURE ENLIVE PO LIQD
237.0000 mL | Freq: Three times a day (TID) | ORAL | Status: DC
  Administered 2021-04-18: 11:00:00 237 mL via ORAL
  Filled 2021-04-17: qty 237

## 2021-04-17 MED ORDER — SODIUM CHLORIDE 0.9% IV SOLUTION: Freq: Once | INTRAVENOUS | Status: AC

## 2021-04-17 MED ORDER — ACETAMINOPHEN 325 MG PO TABS: 650.0000 mg | ORAL_TABLET | ORAL | Status: DC | PRN

## 2021-04-17 MED ORDER — ACETAMINOPHEN 160 MG/5ML PO SUSP: 960.0000 mg | Freq: Four times a day (QID) | ORAL | Status: DC | PRN

## 2021-04-17 MED ORDER — POTASSIUM CHLORIDE 10 MEQ/100ML IV SOLN
10.0000 meq | INTRAVENOUS | Status: AC
  Administered 2021-04-17 – 2021-04-18 (×4): 10 meq via INTRAVENOUS
  Filled 2021-04-17 (×3): qty 100

## 2021-04-17 MED ORDER — PROCHLORPERAZINE MALEATE 10 MG PO TABS
10.0000 mg | ORAL_TABLET | Freq: Four times a day (QID) | ORAL | Status: DC | PRN
  Filled 2021-04-17: qty 1

## 2021-04-17 MED ORDER — POTASSIUM CHLORIDE CRYS ER 20 MEQ PO TBCR: 40.0000 meq | EXTENDED_RELEASE_TABLET | ORAL | Status: DC

## 2021-04-17 MED ORDER — DIGOXIN 0.25 MG/ML IJ SOLN
0.1250 mg | Freq: Four times a day (QID) | INTRAMUSCULAR | Status: AC
  Administered 2021-04-17 – 2021-04-18 (×3): 0.125 mg via INTRAVENOUS
  Filled 2021-04-17 (×3): qty 2

## 2021-04-17 MED ORDER — ONDANSETRON HCL 4 MG/2ML IJ SOLN: 4.0000 mg | Freq: Four times a day (QID) | INTRAMUSCULAR | Status: DC | PRN

## 2021-04-17 MED ORDER — POTASSIUM & SODIUM PHOSPHATES 280-160-250 MG PO PACK
1.0000 | PACK | Freq: Three times a day (TID) | ORAL | Status: AC
  Administered 2021-04-17 – 2021-04-18 (×3): 1 via ORAL
  Filled 2021-04-17 (×4): qty 1

## 2021-04-17 MED ORDER — DIGOXIN 0.25 MG/ML IJ SOLN
0.2500 mg | Freq: Once | INTRAMUSCULAR | Status: AC
  Administered 2021-04-17: 14:00:00 0.25 mg via INTRAVENOUS
  Filled 2021-04-17: qty 2

## 2021-04-17 MED ORDER — ENOXAPARIN SODIUM 40 MG/0.4ML IJ SOSY: 40.0000 mg | PREFILLED_SYRINGE | INTRAMUSCULAR | Status: DC

## 2021-04-17 MED ORDER — MAGNESIUM SULFATE 2 GM/50ML IV SOLN
2.0000 g | Freq: Once | INTRAVENOUS | Status: AC
Start: 2021-04-17 — End: 2021-04-17
  Administered 2021-04-17: 21:00:00 2 g via INTRAVENOUS
  Filled 2021-04-17: qty 50

## 2021-04-17 MED ORDER — SODIUM CHLORIDE 0.9 % IV SOLN: Freq: Once | INTRAVENOUS | Status: AC

## 2021-04-17 NOTE — ED Notes (Signed)
White blood count 1.4. Hbg 6.2. PA [place] made aware.

## 2021-04-17 NOTE — ED Notes (Signed)
3rd run of potassium hung

## 2021-04-17 NOTE — ED Triage Notes (Signed)
Pt came from IR , was originally there for G- tube placement. However, was brought over here d/t AFIb and hypotension. Decreased appetite, hasn't been able to eat or drink much for the last 2 weeks. Hgb 6.5.

## 2021-04-17 NOTE — Progress Notes (Signed)
°   04/17/21 1140 04/17/21 1145 04/17/21 1200  Vital Signs  Pulse Rate (!) 146 (!) 170 (!) 150  Resp 19 18 18   BP 92/67 91/65 104/63  MAP (mmHg) 76  --   --   BP Location Left Arm  --   --   BP Method Automatic  --   --   Patient Position (if appropriate) Lying  --   --    Patient at interventional radiology department for scheduled G-tube placement. On arrival to nurses station patient in afib rvr with heart rate [id number], systolic blood pressure in 90s, oxygen saturation 98% on room air. Patient denies chest pain, shortness of breath, or dizziness. Patient wife states that over the past few days patient has been unable to eat, drink or take medications due to poor appetite and difficulty swallowing. Dr. Dwaine Gale made aware of vital signs and critical hemoglobin of 6.5. Decision made by Dr. Dwaine Gale for patient to be taken emergency department for further evaluation. Wife updated at bedside by Dr. Dwaine Gale. Patient and belongings transferred to room 9 at [facility] emergency department. Wife present during transport. Bedside report given to [facility], RN.

## 2021-04-17 NOTE — ED Provider Notes (Signed)
[facility] EMERGENCY DEPARTMENT Provider Note   CSN: [id number] Arrival date & time: 04/17/21  1159     History No chief complaint on file.   Robert [name] is a 80 y.o. male  HPI Patient is a 80 year old male with past medical history significant for chronic A. fib and hypertension also with oral cancer.   Patient is being scheduled for PEG tube placement due to difficulty eating secondary to his oral cancer.   Patient was scheduled for PEG tube placement 04/10/2021 but his procedure had to be canceled due to patient presented to the IR suite in A. fib RVR.  His symptoms improved with metoprolol boluses and he was feeling improved enough to go home after resolution of RVR.  Patient today presents again from the IR suite where he was found to be in A. fib RVR.  He last took Xarelto Sunday evening and last took his metoprolol Saturday evening (4 and 3 days ago respectively).  He states that over the past 5 or 6 days he has felt more weak fatigued denies any shortness of breath chest pain or dizziness but states that he feels weak enough that he has some difficulty getting up and walking around.  He normally is ambulatory but has been not walking and has had decreased exercise as result of his fatigue  Patient informs me that he did have a blood transfusion 2 or 3 weeks ago.  On my review of the MAR it appears that this was done 03/22/2021.      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   Permanent atrial fibrillation ([place]) 10/22/2018   Non-rheumatic mitral regurgitation 10/22/2018   Obesity (BMI 30-39.9) 10/22/2018    Past Surgical History:  Procedure Laterality Date   DENTAL SURGERY     IR FLUORO RM 30-60 MIN  04/10/2021       Family History  Problem Relation Age of Onset   COPD Mother        Heavy smoker for 27 years   Hypertension Father        uncontrolled    Social History   Tobacco Use   Smoking status: Never   Smokeless tobacco: Former    Types: Nurse, children's Use: Former  Substance Use Topics   Alcohol use: Yes    Comment: occ   Drug use: Never    Home Medications Prior to Admission medications   Medication Sig Start Date End Date Taking? Authorizing Provider  acetaminophen (TYLENOL) 160 MG/5ML suspension Take 30 mLs (960 mg total) by mouth every 6 (six) hours as needed for moderate pain. 02/13/21  Yes Eppie Gibson, MD  docusate sodium (COLACE) 100 MG capsule Take 100 mg by mouth daily as needed for mild constipation.   Yes [provider]  furosemide (LASIX) 20 MG tablet Take 1 tablet (20 mg total) by mouth daily. Patient taking differently: Take 20 mg by  mouth daily as needed for fluid or edema. 02/12/21 04/17/21 Yes Little Ishikawa, MD  Nutritional Supplements (KATE FARMS STANDARD 1.4) LIQD 1 Container by Enteral route 5 (five) times daily. Patient taking differently: 1 Container by Enteral route 3 (three) times daily. Taking Ensure 04/08/21 03/21/22 Yes Eppie Gibson, MD  prochlorperazine (COMPAZINE) 10 MG tablet Take 1 tablet (10 mg total) by mouth every 6 (six) hours as needed. Patient taking differently: Take 10 mg by mouth every 6 (six) hours as needed for nausea. 02/18/21  Yes Heilingoetter, Cassandra L, PA-C  metoprolol tartrate (LOPRESSOR) 100 MG tablet Take 100 mg by mouth 2 (two) times daily. 07/26/18   [provider]  rivaroxaban (XARELTO) 20 MG TABS tablet Take 1 tablet (20 mg total)  by mouth daily with supper. 02/14/21   Little Ishikawa, MD    Allergies    Patient has no known allergies.  Review of Systems   Review of Systems  Constitutional:  Positive for fatigue. Negative for chills and fever.  HENT:  Negative for congestion.   Eyes:  Negative for pain.  Respiratory:  Negative for cough and shortness of breath.   Cardiovascular:  Negative for chest pain and leg swelling.  Gastrointestinal:  Negative for abdominal pain, diarrhea, nausea and vomiting.  Genitourinary:  Negative for dysuria.  Musculoskeletal:  Negative for myalgias.  Skin:  Negative for rash.  Neurological:  Positive for weakness. Negative for dizziness and headaches.   Physical Exam Updated Vital Signs BP 90/68    Pulse (!) 130    Temp 98.2 F (36.8 C) (Oral)    Resp 15    SpO2 100%   Physical Exam Vitals and nursing note reviewed.  Constitutional:      Appearance: He is ill-appearing.     Comments: Chronically ill-appearing, pallorous fatigued 80 year old male  HENT:     Head: Normocephalic and atraumatic.     Nose: Nose normal.     Mouth/Throat:     Mouth: Mucous membranes are moist.  Eyes:     General: No scleral icterus. Neck:     Comments: No JVP Cardiovascular:     Rate and Rhythm: Tachycardia present. Rhythm irregular.     Pulses: Normal pulses.     Heart sounds: Normal heart sounds.  Pulmonary:     Effort: Pulmonary effort is normal. No respiratory distress.     Breath sounds: No wheezing.  Abdominal:     Palpations: Abdomen is soft.     Tenderness: There is no abdominal tenderness. There is no guarding or rebound.  Musculoskeletal:     Cervical back: Normal range of motion.     Right lower leg: No edema.     Left lower leg: No edema.     Comments: No lower extremity edema of note  Skin:    General: Skin is warm and dry.     Capillary Refill: Capillary refill takes less than 2 seconds.  Neurological:     Mental Status: He is alert. Mental status is at  baseline.  Psychiatric:        Mood and Affect: Mood normal.        Behavior: Behavior normal.    ED Results / Procedures / Treatments   Labs (all labs ordered are listed, but only abnormal results are displayed) Labs Reviewed  CBC WITH DIFFERENTIAL/PLATELET - Abnormal; Notable for the following components:      Result Value   WBC 1.4 (*)    RBC 2.09 (*)  Hemoglobin 6.2 (*)    HCT 18.7 (*)    RDW 20.3 (*)    Platelets 60 (*)    Neutro Abs 1.0 (*)    Lymphs Abs 0.1 (*)    All other components within normal limits  COMPREHENSIVE METABOLIC PANEL - Abnormal; Notable for the following components:   Sodium 126 (*)    Potassium 2.8 (*)    Chloride 91 (*)    Calcium 7.4 (*)    Total Protein 5.0 (*)    Albumin 2.1 (*)    Total Bilirubin 1.6 (*)    All other components within normal limits  PROTIME-INR - Abnormal; Notable for the following components:   Prothrombin Time 15.8 (*)    INR 1.3 (*)    All other components within normal limits  MAGNESIUM - Abnormal; Notable for the following components:   Magnesium 1.3 (*)    All other components within normal limits  IRON AND TIBC - Abnormal; Notable for the following components:   TIBC 129 (*)    All other components within normal limits  FERRITIN - Abnormal; Notable for the following components:   Ferritin 986 (*)    All other components within normal limits  RETICULOCYTES - Abnormal; Notable for the following components:   RBC. 2.09 (*)    Immature Retic Fract 36.6 (*)    All other components within normal limits  RESP PANEL BY RT-PCR (FLU A&B, COVID) ARPGX2  VITAMIN B12  FOLATE  PHOSPHORUS  POC OCCULT BLOOD, ED  TYPE AND SCREEN  PREPARE RBC (CROSSMATCH)    EKG None  Radiology DG Chest Portable 1 View  Result Date: 04/17/2021 CLINICAL DATA:  Malnutrition. EXAM: PORTABLE CHEST 1 VIEW COMPARISON:  One view chest x-ray 04/10/2021 FINDINGS: The heart is enlarged. Mild edema again noted. Small effusions are present.  Patchy airspace opacities are again noted in the right mid lung and left lower lobe. IMPRESSION: 1. Stable cardiomegaly and mild edema. 2. Small bilateral pleural effusions. 3. Patchy bilateral airspace disease likely reflects atelectasis. Infection is not excluded. Electronically Signed   By: San Morelle M.D.   On: 04/17/2021 12:58    Procedures .Critical Care Performed by: Tedd Sias, PA Authorized by: Tedd Sias, PA   Critical care provider statement:    Critical care time (minutes):  35   Critical care time was exclusive of:  Separately billable procedures and treating other patients and teaching time   Critical care was necessary to treat or prevent imminent or life-threatening deterioration of the following conditions: afib RVR + severe anemia.   Critical care was time spent personally by me on the following activities:  Development of treatment plan with patient or surrogate, review of old charts, re-evaluation of patient's condition, pulse oximetry, ordering and review of radiographic studies, ordering and review of laboratory studies, ordering and performing treatments and interventions, obtaining history from patient or surrogate, examination of patient and evaluation of patient's response to treatment   Care discussed with: admitting provider     Medications Ordered in ED Medications  0.9 %  sodium chloride infusion ( Intravenous New Bag/Given 04/17/21 1341)  potassium chloride SA (KLOR-CON M) CR tablet 40 mEq (has no administration in time range)  magnesium sulfate IVPB 2 g 50 mL (has no administration in time range)  potassium chloride SA (KLOR-CON M) CR tablet 40 mEq (has no administration in time range)  potassium & sodium phosphates (PHOS-NAK) 280-160-250 MG packet 1 packet (has no administration in time  range)  acetaminophen (TYLENOL) 160 MG/5ML suspension 960 mg (has no administration in time range)  prochlorperazine (COMPAZINE) tablet 10 mg (has no  administration in time range)  docusate sodium (COLACE) capsule 100 mg (has no administration in time range)  [employer] [place] Standard 1.4 LIQD 1 Container (has no administration in time range)  acetaminophen (TYLENOL) tablet 650 mg (has no administration in time range)  ondansetron (ZOFRAN) injection 4 mg (has no administration in time range)  enoxaparin (LOVENOX) injection 40 mg (has no administration in time range)  digoxin (LANOXIN) 0.25 MG/ML injection 0.125 mg (has no administration in time range)  digoxin (LANOXIN) 0.25 MG/ML injection 0.25 mg (0.25 mg Intravenous Given 04/17/21 1338)    ED Course  I have reviewed the triage vital signs and the nursing notes.  Pertinent labs & imaging results that were available during my care of the patient were reviewed by me and considered in my medical decision making (see chart for details).  Clinical Course as of 04/17/21 1625  Wed Apr 17, 2021  1301 Discussed with Dr. Einar Gip of cardiology who recommends digoxin 0.25 mg we will place that order now.  We will also provide with gentle IV fluid hydration for the first unit of PRBCs.  Patient will require admission for anemia.  Dr. Einar Gip will follow along for A. fib/A. fib RVR management. [WF]  1506 Discussed w [facility] oncology [WF]  1551 Discussed with Dr. Roosevelt Locks who will admit [WF]    Clinical Course User Index [WF] Tedd Sias, PA   MDM Rules/Calculators/A&P                          Patient is 80 year old gentleman with history of permanent A. fib and RVR and very anemic.  Has multiple reasons to be in rapid ventricular response at this time may be due to anemia versus also being off his metoprolol since Saturday evening.  Discussed with Dr. Einar Gip of cardiology who is willing to evaluate patient as consult for A. fib during hospital stay.  Recommends digoxin given soft BP.  Patient mentating well.  Systolic blood pressure 834 on my recheck.   CBC with pancytopenia discussed with Dr. Alen Blew of  oncology who suspects that this is most likely related to chemotherapy.  CMP notable for hypokalemia and hyponatremia hypochloremia and hypomagnesemia noted.  These will be repleted.   Anemia panel obtained.  Fecal occult negative this was obtained by RN staff.  COVID influenza ordered and pending.  Echocardiogram 04/26/2019:  Left ventricle cavity is normal in size. Moderate concentric hypertrophy  of the left ventricle. Normal global wall motion. Normal LV systolic  function with visual EF 50-55%. Unable to evaluate diastolic function due  to atrial fibrillation.  Left atrial cavity is severely dilated.  Trileaflet aortic valve.  Mild (Grade I) aortic regurgitation.  Moderate (Grade III) mitral regurgitation.  Mild tricuspid regurgitation.  No evidence of pulmonary hypertension.  No significant change compared to previous study on 10/14/2017.   Patient mated to Dr. Candiss Norse hospitalist service.  Reviewed his expert consultation and care of patient.  Final Clinical Impression(s) / ED Diagnoses Final diagnoses:  Atrial fibrillation with RVR (HCC)  Electrolyte abnormality  Hypokalemia  Hypomagnesemia  Hypochloremia    Rx / DC Orders ED Discharge Orders          Ordered    Amb referral to AFIB Clinic        04/17/21 1623  Tedd Sias, [place] 00/86/76 1950    Lianne Cure, DO 93/26/71 2127

## 2021-04-17 NOTE — Telephone Encounter (Signed)
Called patient's wife to ask what time that they would like to come for labs tomorrow, lvm for a return call.

## 2021-04-17 NOTE — H&P (Signed)
History and Physical    Robert [name] ZOX:[id number] DOB: [birth year] DOA: 04/17/2021  PCP: Farris Has, MD (Confirm with patient/family/NH records and if not entered, this has to be entered at [facility] point of entry) Patient coming from: Home  I have personally briefly reviewed patient's old medical records in [facility] Health Link  Chief Complaint: Feeling ok  HPI: Robert [name] is a 80 y.o. 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 fib on Xarelto, chronic diastolic CHF, severe mitral regurgitation, presented with rapid A. fib.  Head and neck squamous cell carcinoma diagnosed in 2019, status post resection and recurrent in May 2022.  Then it was found in October 2022 that patient had metastatic lesions in lung and mediastinal lymph nodes and patient started chemo and immunotherapy in November, and last session of chemo and immune therapy was before Christmas, which including Keytruda and carboplatin.  Increasingly, patient has had swelling of the right jaw, unable to close mouth and unable to swallow solid food.  His diet has reduced to liquid and insurance which family reported he does not take enough.  No cough or choke after drinking or eating.  And patient has been losing weight.  Oncology ordered IR guided G-tube patient, last week while at IR, it was found patient in rapid A. fib and hypotensive, and procedure was aborted and patient sent home.  Over the weekend, metoprolol and Xarelto were held since Saturday for second try og PEG tube insertion today.  Today, at IR, patient was found to be in rapid A. fib again and sent to the ED.  Patient denies any shortness of breath, no palpitations no chest pains no lightheadedness.  ED Course: Rapid A. fib, blood pressure borderline low, cardiology contacted and ordered 1 dose of digoxin.  Blood work showed new onset of pancytopenia, hemoglobin  6.5, WBC 1.4, platelet 68, K2.8, magnesium 1.3  Review of Systems: As per HPI otherwise 14 point review of systems negative.    Past Medical History:  Diagnosis Date   A-fib (HCC)    HTN (hypertension)     Past Surgical History:  Procedure Laterality Date   DENTAL SURGERY     IR FLUORO RM 30-60 MIN  04/10/2021     reports that he has never smoked. He has quit using smokeless tobacco.  His smokeless tobacco use included chew. He reports current alcohol use. He reports that he does not use drugs.  No Known Allergies  Family History  Problem Relation Age of Onset   COPD Mother        Heavy smoker for 62 years   Hypertension Father        uncontrolled     Prior to Admission medications   Medication Sig Start Date End Date Taking? Authorizing Provider  acetaminophen (TYLENOL) 160 MG/5ML suspension Take 30 mLs (960 mg total) by mouth every 6 (six) hours as needed for moderate pain. 02/13/21  Yes Lonie Peak, MD  docusate sodium (COLACE) 100 MG capsule Take 100 mg by mouth daily as needed for mild constipation.   Yes [provider]  furosemide (LASIX) 20 MG tablet Take 1 tablet (20 mg total) by mouth daily. Patient taking differently: Take 20 mg by mouth daily as needed for fluid or edema. 02/12/21 04/17/21 Yes Azucena Fallen, MD  Nutritional Supplements (KATE FARMS STANDARD 1.4) LIQD 1 Container by Enteral route 5 (five) times daily. Patient taking differently: 1  Container by Enteral route 3 (three) times daily. Taking Ensure 04/08/21 03/21/22 Yes Lonie Peak, MD  prochlorperazine (COMPAZINE) 10 MG tablet Take 1 tablet (10 mg total) by mouth every 6 (six) hours as needed. Patient taking differently: Take 10 mg by mouth every 6 (six) hours as needed for nausea. 02/18/21  Yes Heilingoetter, Cassandra L, PA-C  metoprolol tartrate (LOPRESSOR) 100 MG tablet Take 100 mg by mouth 2 (two) times daily. 07/26/18   [provider]  rivaroxaban (XARELTO) 20 MG TABS  tablet Take 1 tablet (20 mg total) by mouth daily with supper. 02/14/21   Azucena Fallen, MD    Physical Exam: Vitals:   04/17/21 1320 04/17/21 1415 04/17/21 1525 04/17/21 1540  BP:  (!) 103/59 (!) 138/113 90/68  Pulse:  (!) 155 (!) 122 (!) 130  Resp:  (!) 25 18 15   Temp: 99.1 F (37.3 C)  98.5 F (36.9 C) 98.2 F (36.8 C)  TempSrc: Rectal  Oral Oral  SpO2:  100% 98% 100%    Constitutional: NAD, calm, comfortable Vitals:   04/17/21 1320 04/17/21 1415 04/17/21 1525 04/17/21 1540  BP:  (!) 103/59 (!) 138/113 90/68  Pulse:  (!) 155 (!) 122 (!) 130  Resp:  (!) 25 18 15   Temp: 99.1 F (37.3 C)  98.5 F (36.9 C) 98.2 F (36.8 C)  TempSrc: Rectal  Oral Oral  SpO2:  100% 98% 100%   Eyes: PERRL, lids and conjunctivae normal ENMT: Mucous membranes are dry. Posterior pharynx clear of any exudate or lesions.Normal dentition.  Neck: normal, supple, no masses, no thyromegaly Respiratory: clear to auscultation bilaterally, no wheezing, no crackles. Normal respiratory effort. No accessory muscle use.  Cardiovascular: Irregular heartbeat, no murmurs / rubs / gallops. No extremity edema. 2+ pedal pulses. No carotid bruits.  Abdomen: no tenderness, no masses palpated. No hepatosplenomegaly. Bowel sounds positive.  Musculoskeletal: no clubbing / cyanosis. No joint deformity upper and lower extremities. Good ROM, no contractures. Normal muscle tone.  Skin: no rashes, lesions, ulcers. No induration Neurologic: CN 2-12 grossly intact. Sensation intact, DTR normal. Strength 5/5 in all 4.  Psychiatric: Normal judgment and insight. Alert and oriented x 3. Normal mood.    Labs on Admission: I have personally reviewed following labs and imaging studies  CBC: Recent Labs  Lab 04/17/21 1112 04/17/21 1325  WBC 1.4* 1.4*  NEUTROABS  --  1.0*  HGB 6.5* 6.2*  HCT 19.9* 18.7*  MCV 87.3 89.5  PLT 68* 60*   Basic Metabolic Panel: Recent Labs  Lab 04/17/21 1325  NA 126*  K 2.8*  CL 91*   CO2 24  GLUCOSE 95  BUN 17  CREATININE 0.65  CALCIUM 7.4*  MG 1.3*   GFR: Estimated Creatinine Clearance: 83.5 mL/min (by C-G formula based on SCr of 0.65 mg/dL). Liver Function Tests: Recent Labs  Lab 04/17/21 1325  AST 19  ALT 18  ALKPHOS 81  BILITOT 1.6*  PROT 5.0*  ALBUMIN 2.1*   No results for input(s): LIPASE, AMYLASE in the last 168 hours. No results for input(s): AMMONIA in the last 168 hours. Coagulation Profile: Recent Labs  Lab 04/17/21 1112 04/17/21 1325  INR 1.2 1.3*   Cardiac Enzymes: No results for input(s): CKTOTAL, CKMB, CKMBINDEX, TROPONINI in the last 168 hours. BNP (last 3 results) No results for input(s): PROBNP in the last 8760 hours. HbA1C: No results for input(s): HGBA1C in the last 72 hours. CBG: No results for input(s): GLUCAP in the last 168 hours. Lipid  Profile: No results for input(s): CHOL, HDL, LDLCALC, TRIG, CHOLHDL, LDLDIRECT in the last 72 hours. Thyroid Function Tests: No results for input(s): TSH, T4TOTAL, FREET4, T3FREE, THYROIDAB in the last 72 hours. Anemia Panel: Recent Labs    04/17/21 1325  VITAMINB12 859  FOLATE 6.3  FERRITIN 986*  TIBC 129*  IRON 45  RETICCTPCT 1.9   Urine analysis:    Component Value Date/Time   COLORURINE YELLOW 02/08/2021 1018   APPEARANCEUR CLEAR 02/08/2021 1018   LABSPEC 1.016 02/08/2021 1018   PHURINE 5.0 02/08/2021 1018   GLUCOSEU NEGATIVE 02/08/2021 1018   HGBUR NEGATIVE 02/08/2021 1018   BILIRUBINUR NEGATIVE 02/08/2021 1018   KETONESUR 5 (A) 02/08/2021 1018   PROTEINUR NEGATIVE 02/08/2021 1018   NITRITE NEGATIVE 02/08/2021 1018   LEUKOCYTESUR NEGATIVE 02/08/2021 1018    Radiological Exams on Admission: DG Chest Portable 1 View  Result Date: 04/17/2021 CLINICAL DATA:  Malnutrition. EXAM: PORTABLE CHEST 1 VIEW COMPARISON:  One view chest x-ray 04/10/2021 FINDINGS: The heart is enlarged. Mild edema again noted. Small effusions are present. Patchy airspace opacities are again  noted in the right mid lung and left lower lobe. IMPRESSION: 1. Stable cardiomegaly and mild edema. 2. Small bilateral pleural effusions. 3. Patchy bilateral airspace disease likely reflects atelectasis. Infection is not excluded. Electronically Signed   By: Marin Roberts M.D.   On: 04/17/2021 12:58    EKG: Independently reviewed.  Rapid A. fib with nonspecific ST changes  Assessment/Plan Principal Problem:   A-fib [facility]) Active Problems:   Permanent atrial fibrillation (HCC)  (please populate well all problems here in Problem List. (For example, if patient is on BP meds at home and you resume or decide to hold them, it is a problem that needs to be her. Same for CAD, COPD, HLD and so on)  A. fib with RVR -With a history of chronic diastolic CHF and borderline low blood pressure reading, agreed with digoxin loading.  Add as needed metoprolol. -Continue to hold anticoagulation due to severe anemia and severe thrombocytopenia.  Severe hypokalemia -P.o. replacement 40 mg x 3  Severe hypomagnesemia -2 g IV replacement, recheck level tomorrow. -We will also check phosphorus level, and gave 3 times daily K-Phos.  Hyponatremia -Clinically appears to be hypovolemic likely from poor oral intake/dehydration.  On IV fluids, will check hyponatremia study.  Recheck sodium level tomorrow.  New onset of pancytopenia -Likely related to recent chemotherapy and immunotherapy. -Severe normocytic anemia, PRBC x2, recheck level tomorrow. -For severe thrombocytopenia, will hold chemical DVT prophylaxis and anticoagulation.  Chronic diastolic CHF -Symptoms and signs of hypovolemia, on IV fluids, hold off Lasix for today.  Head and neck metastatic squamous cell carcinoma -Outpatient oncology follow-up.  Failure to thrive -PEG tube when more stable -Continue Imdur, continue liquid diet, consult dietitian.  DVT prophylaxis: SCD Code Status: Full code Family Communication: Wife at  bedside Disposition Plan: Expect more than 2 midnight hospital stay, patient is sick with multiple clinical problems AKI, hyponatremia, electrolyte imbalance and anemia. Consults called: Cardiology Dr. Jacinto Halim, oncology Dr. Arbutus Ped Admission status: PCU   Emeline General MD [facility] Pager [phone]  04/17/2021, 4:28 PM

## 2021-04-17 NOTE — H&P (Addendum)
Referring Physician(s): Mohamed,Mohamed  Supervising Physician: Mir, Biochemist, clinical  Patient Status:  [facility]  Chief Complaint:  Dysphagia, malnutrition  Subjective: Pt familiar to IR service from right sixth rib biopsy on 02/12/21 and recent plans for G tube placement on 04/10/21, however prior to procedure pt was noted to be in afib with RVR , prompting rescheduling of case to today. He is a 80 y.o. 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 He had metastatic disease in October 2022 with metastatic disease involving the lung as well as mediastinal lymphadenopathy and multiple suspicious liver lesion as well as multiple osseous metastasis and right adrenal suspicious metastatic disease. Oncology is requesting gastrostomy tube placement for due to dysphagia and malnutrition. He currently denies fever,HA,CP, abd/back pain, N/V or bleeding. He does have dyspnea, occ cough with phlegm production, dysphagia, poor oral intake, dry mouth, garbled speech from head/neck ca.   Past Medical History:  Diagnosis Date   A-fib ([place])    HTN (hypertension)    Past Surgical History:  Procedure Laterality Date   DENTAL SURGERY     IR FLUORO RM 30-60 MIN  04/10/2021     Allergies: Patient has no known allergies.  Medications: Prior to Admission medications   Medication Sig Start Date End Date Taking? Authorizing Provider  acetaminophen (TYLENOL) 160 MG/5ML suspension Take 30 mLs (960 mg total) by mouth every 6 (six) hours as needed for moderate pain. 02/13/21   Eppie Gibson, MD  docusate sodium (COLACE) 100 MG capsule Take 100 mg by mouth 2 (two) times daily as needed for mild constipation.     [provider]  furosemide (LASIX) 20 MG tablet Take 1 tablet (20 mg total) by mouth daily. Patient taking differently: Take 20 mg by mouth daily as needed for fluid or edema. 02/12/21 04/10/21  Little Ishikawa, MD  metoprolol tartrate (LOPRESSOR) 100 MG tablet Take 100 mg by mouth 2 (two) times daily. 07/26/18   [provider]  Nutritional Supplements (KATE FARMS STANDARD 1.4) LIQD 1 Container by Enteral route 5 (five) times daily. Patient taking differently: 1 Container by Enteral route 3 (three) times daily. Taking Ensure 04/08/21 03/21/22  Eppie Gibson, MD  prochlorperazine (COMPAZINE) 10 MG tablet Take 1 tablet (10 mg total) by mouth every 6 (six) hours as needed. 02/18/21   Heilingoetter, Cassandra L, PA-C  rivaroxaban (XARELTO) 20 MG TABS tablet Take 1 tablet (20 mg total) by mouth daily with supper. 02/14/21   Little Ishikawa, MD     Vital Signs: BP 93/71 (BP Location: Left Arm)    Pulse 67    Temp 98 F (36.7 C) (Oral)    Ht 6' (1.829 m)    Wt 185 lb (83.9 kg)    SpO2 100%    BMI 25.09 kg/m   Physical Exam awake/alert; speech garbled secondary to head/neck cancer; chest- CTA bilat; heart- irreg irreg; abd- soft,+BS,NT; no LE edema  Imaging: No results found.  Labs:  CBC: Recent Labs    03/18/21 0900 04/08/21 0811 04/10/21 1003 04/10/21 1506  WBC 5.1 11.8* 12.8* 9.0  HGB 8.1* 9.2* 9.4* 8.2*  HCT 24.9* 28.3* 30.6* 26.6*  PLT 167 215 195 141*    COAGS: Recent Labs  02/12/21 0515 04/10/21 1003  INR 1.1 1.0    BMP: Recent Labs    02/27/21 0922 03/18/21 0900 04/08/21 0811 04/10/21 1506  NA 128* 132* 135 133*  K 3.5 2.8* 3.4* 3.5  CL 95* 97* 99 100  CO2 24 25 25 27   GLUCOSE 147* 120* 141* 99  BUN 17 13 10 15   CALCIUM 9.7 7.9* 8.3* 7.4*  CREATININE 0.82 0.67 0.71 0.65  GFRNONAA >60 >60 >60 >60    LIVER FUNCTION TESTS: Recent Labs    02/18/21 1415 02/27/21 0922 03/18/21 0900 04/08/21 0811  BILITOT 1.0 0.6 1.4* 1.1   AST 22 30 36 15  ALT 22 31 36 14  ALKPHOS 186* 211* 104 117  PROT 7.1 6.6 5.6* 6.0*  ALBUMIN 2.5* 2.1* 2.1* 2.3*    Assessment and Plan: Pt familiar to IR service from right sixth rib biopsy on 02/12/21 and recent plans for G tube placement on 04/10/21, however prior to procedure pt was noted to be in afib with RVR , prompting rescheduling of case to today. He is a 80 y.o. 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 He had metastatic disease in October 2022 with metastatic disease involving the lung as well as mediastinal lymphadenopathy and multiple suspicious liver lesion as well as multiple osseous metastasis and right adrenal suspicious metastatic disease. Oncology is requesting gastrostomy tube placement for due to dysphagia and malnutrition.Risks and benefits image guided gastrostomy tube placement was discussed with the patient /spouse including, but not limited to the need for a barium enema during the procedure, bleeding, infection, peritonitis and/or damage to adjacent structures.  All of the patient's questions were answered, patient is agreeable to proceed.  Consent signed and in chart.    Electronically Signed: D. Rowe Robert, PA-C 04/17/2021, 10:28 AM   I spent a total of 25 minutes at the the patient's bedside AND on the patient's hospital floor or unit, greater than 50% of which was counseling/coordinating care for percutaneous gastrostomy tube placement

## 2021-04-17 NOTE — ED Notes (Signed)
Pt's AFIb w hypotension of 80/60. PA F[place] made aware.

## 2021-04-17 NOTE — Consult Note (Signed)
CARDIOLOGY CONSULT NOTE  Patient ID: Robert [name] MRN: [id number] DOB/AGE: 1940-09-21 80 y.o.  Admit date: 04/17/2021 Referring Physician  Mikey College, MD Primary Physician:  Farris Has, MD Reason for Consultation  A. Fib with RVR  Patient ID: Robert [name], male    DOB: [birth year], 80 y.o.   MRN: [id number]  A. Fib with RVR.   HPI:    Robert [name]  is a 80 y.o. 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  Underwent surgery again.  Unfortunately developed again metastatic disease in October 2022 involving the lung as well as mediastinal lymphadenopathy and multiple suspicious liver lesions and metastatic osseous lesions and adrenal mass again suspicious for metastatic disease.  Cardiac history includes permanent atrial fibrillation when he presented with TIA in 2009, hypertension, chewing tobacco which he quit in May 2018.  He is now admitted to the hospital with failure to thrive, A. fib with RVR, had gone to radiology for gastric tube placement, beta-blockers was held due to low blood pressure, then developed A. fib with RVR procedure was canceled and transferred to emergency room.  He was also found to have severe anemia.  I was consulted to manage A. fib with RVR.  Patient presently denies any palpitations or chest pain or dyspnea.  He is very tired and fatigued, his wife and daughter present at the bedside.  Past Medical History:  Diagnosis Date   A-fib ([facility])    HTN (hypertension)    Past Surgical History:  Procedure Laterality Date   DENTAL SURGERY     IR FLUORO RM 30-60 MIN  04/10/2021   Social History   Tobacco Use   Smoking status: Never   Smokeless tobacco: Former    Types: Chew  Substance Use Topics   Alcohol use: Yes    Comment: occ    Family History  Problem Relation Age of Onset   COPD Mother         Heavy smoker for 62 years   Hypertension Father        uncontrolled    Marital Status: Married  ROS  Review of Systems  Constitutional: Positive for decreased appetite, malaise/fatigue and weight loss.  Gastrointestinal:  Positive for anorexia, dysphagia, melena and vomiting. Negative for heartburn and hematochezia.  All other systems reviewed and are negative. Objective   Vitals with BMI 04/17/2021 04/17/2021 04/17/2021  Height - - -  Weight - - -  BMI - - -  Systolic 90 138 103  Diastolic 68 113 59  Pulse 130 469 155    Blood pressure 90/68, pulse (!) 130, temperature 98.2 F (36.8 C), temperature source Oral, resp. rate 15, SpO2 100 %.   Physical Exam Constitutional:      Appearance: He is cachectic. He is ill-appearing.  HENT:     Head: Atraumatic.     Mouth/Throat:     Mouth: Mucous membranes are dry.  Eyes:     Extraocular Movements: Extraocular movements intact.  Neck:     Comments: Rigid skin due to radiation in the past.  JVD cannot be made out.  But appears flat.  No carotid bruit. Cardiovascular:     Rate and Rhythm: Tachycardia present. Rhythm irregular.     Pulses:          Dorsalis pedis pulses are 1+ on the right side and 1+ on the left side.  Posterior tibial pulses are 1+ on the right side and 1+ on the left side.     Heart sounds: No murmur heard.   No gallop. No S3 or S4 sounds.  Pulmonary:     Effort: Pulmonary effort is normal.     Breath sounds: Normal breath sounds.  Abdominal:     General: Abdomen is flat. Bowel sounds are normal.     Palpations: Abdomen is soft.     Comments: Emaciated  Musculoskeletal:        General: No swelling.  Skin:    Capillary Refill: Capillary refill takes less than 2 seconds.  Neurological:     General: No focal deficit present.     Mental Status: He is alert and oriented to person, place, and time.  Psychiatric:        Mood and Affect: Mood is depressed.   Laboratory examination:   Recent Labs     04/08/21 0811 04/10/21 1506 04/17/21 1325  NA 135 133* 126*  K 3.4* 3.5 2.8*  CL 99 100 91*  CO2 25 27 24   GLUCOSE 141* 99 95  BUN 10 15 17   CREATININE 0.71 0.65 0.65  CALCIUM 8.3* 7.4* 7.4*  GFRNONAA >60 >60 >60   estimated creatinine clearance is 83.5 mL/min (by C-G formula based on SCr of 0.65 mg/dL).  CMP Latest Ref Rng & Units 04/17/2021 04/10/2021 04/08/2021  Glucose 70 - 99 mg/dL 95 99 161(W)  BUN 8 - 23 mg/dL 17 15 10   Creatinine 0.61 - 1.24 mg/dL 9.60 4.54 0.98  Sodium 135 - 145 mmol/L 126(L) 133(L) 135  Potassium 3.5 - 5.1 mmol/L 2.8(L) 3.5 3.4(L)  Chloride 98 - 111 mmol/L 91(L) 100 99  CO2 22 - 32 mmol/L 24 27 25   Calcium 8.9 - 10.3 mg/dL 7.4(L) 7.4(L) 8.3(L)  Total Protein 6.5 - 8.1 g/dL 5.0(L) - 6.0(L)  Total Bilirubin 0.3 - 1.2 mg/dL 1.1(B) - 1.1  Alkaline Phos 38 - 126 U/L 81 - 117  AST 15 - 41 U/L 19 - 15  ALT 0 - 44 U/L 18 - 14   CBC Latest Ref Rng & Units 04/17/2021 04/17/2021 04/10/2021  WBC 4.0 - 10.5 K/uL 1.4(LL) 1.4(LL) 9.0  Hemoglobin 13.0 - 17.0 g/dL 6.2(LL) 6.5(LL) 8.2(L)  Hematocrit 39.0 - 52.0 % 18.7(L) 19.9(L) 26.6(L)  Platelets 150 - 400 K/uL 60(L) 68(L) 141(L)   Lipid Panel No results for input(s): CHOL, TRIG, LDLCALC, VLDL, HDL, CHOLHDL, LDLDIRECT in the last 8760 hours.  HEMOGLOBIN A1C No results found for: HGBA1C, MPG TSH Recent Labs    02/27/21 0922 03/18/21 0852 04/08/21 0812  TSH 0.500 0.752 0.914   BNP (last 3 results) No results for input(s): BNP in the last 8760 hours.  Cardiac Panel (last 3 results) No results for input(s): CKTOTAL, CKMB, TROPONINIHS, RELINDX in the last 72 hours.   Medications and allergies  No Known Allergies   Current Meds  Medication Sig   acetaminophen (TYLENOL) 160 MG/5ML suspension Take 30 mLs (960 mg total) by mouth every 6 (six) hours as needed for moderate pain.   docusate sodium (COLACE) 100 MG capsule Take 100 mg by mouth daily as needed for mild constipation.   furosemide (LASIX) 20 MG  tablet Take 1 tablet (20 mg total) by mouth daily. (Patient taking differently: Take 20 mg by mouth daily as needed for fluid or edema.)   Nutritional Supplements (KATE FARMS STANDARD 1.4) LIQD 1 Container by Enteral route 5 (five) times daily. (Patient taking differently: 1  Container by Enteral route 3 (three) times daily. Taking Ensure)   prochlorperazine (COMPAZINE) 10 MG tablet Take 1 tablet (10 mg total) by mouth every 6 (six) hours as needed. (Patient taking differently: Take 10 mg by mouth every 6 (six) hours as needed for nausea.)    Scheduled Meds:  sodium chloride   Intravenous Once   digoxin  0.125 mg Intravenous Q6H   [employer] Standard 1.4  1 Container Enteral 5 X Daily   potassium & sodium phosphates  1 packet Oral TID WC & HS   potassium chloride  40 mEq Oral Once   potassium chloride  40 mEq Oral Q2H   Continuous Infusions:  sodium chloride 125 mL/hr at 04/17/21 1341   magnesium sulfate bolus IVPB     PRN Meds:.acetaminophen, docusate sodium, metoprolol tartrate, ondansetron (ZOFRAN) IV, prochlorperazine   No intake/output data recorded. No intake/output data recorded.    Radiology:   DG Chest Portable 1 View  Result Date: 04/17/2021 CLINICAL DATA:  Malnutrition. EXAM: PORTABLE CHEST 1 VIEW COMPARISON:  One view chest x-ray 04/10/2021 FINDINGS: The heart is enlarged. Mild edema again noted. Small effusions are present. Patchy airspace opacities are again noted in the right mid lung and left lower lobe. IMPRESSION: 1. Stable cardiomegaly and mild edema. 2. Small bilateral pleural effusions. 3. Patchy bilateral airspace disease likely reflects atelectasis. Infection is not excluded. Electronically Signed   By: Marin Roberts M.D.   On: 04/17/2021 12:58    PET scan complete body 03/04/2021:  1. In correlation with previous imaging, there is widespread metastatic disease to the lungs, bones, liver, spleen, right adrenal gland, several lymph nodes and additional  soft tissue nodules. The thoracic adenopathy and hepatic metastatic disease demonstrate significant central necrosis which may relate to interval therapy.  2. New biapical airspace opacities compared with recent prior studies, with associated hypermetabolic activity, likely inflammatory/infectious. If recent radiation therapy to this area, that could be causative. 3. Indeterminate focal activity superior to the bladder, appearing to involve the small bowel, likely a metastasis. No evidence of bowel obstruction or perforation.  Cardiac Studies:   Echocardiogram 04/26/2019: Left ventricle cavity is normal in size. Moderate concentric hypertrophy of the left ventricle. Normal global wall motion. Normal LV systolic function with visual EF 50-55%. Unable to evaluate diastolic function due to atrial fibrillation.  Left atrial cavity is severely dilated. Trileaflet aortic valve.  Mild (Grade I) aortic regurgitation. Moderate (Grade III) mitral regurgitation. Mild tricuspid regurgitation.  No evidence of pulmonary hypertension. No significant change compared to previous study on 10/14/2017.   Lexiscan myoview stress test 04/27/2017: 1. Pharmacologic stress testing was performed with intravenous administration of .4 mg of Lexiscan over a 10-15 seconds infusion. Stress EKG is non diagnostic for ischemia as it is a pharmacologic stress. 2. The overall quality of the study is good.  Left ventricular cavity is noted to be normal on the rest and stress studies.  Gated SPECT images reveal normal myocardial thickening and wall motion.  The left ventricular ejection fraction was calculated or visually estimated to be 68%.  SPECT images demonstrate small perfusion abnormality of mild intensity in the mid inferior myocardial wall(s) on the stress images.   3. This is a low risk study  EKG:  EKG 04/17/2021: Atrial fibrillation with rapid ventricular response at rate of 1 and 41 bpm, incomplete right bundle  branch block.  No evidence of ischemia.  Assessment   Permanent atrial fibrillation with rapid ventricular response CHA2DS2-VASc Score is 5.  Yearly risk of stroke: 7.2% (A, HTN, Prior CVA).  Score of 1=0.6; 2=2.2; 3=3.2; 4=4.8; 5=7.2; 6=9.8; 7=>9.8) 2.  Metastatic head and neck cancer presently on palliative chemotherapy  Recommendations:   I had a 1 hour long discussion with the patient and his wife and daughter at the bedside.  His A. fib with RVR is related to underlying hypermetabolic state from malignancy, severe anemia.  Rate controlling is not indicated, his blood pressure is very low, patient is dehydrated, has poor oral intake.  Overall I fear he has extremely guarded prognosis and his survival is probably <6 months and he would probably be appropriate for hospice care and palliative care only.  However advised him to discuss this further with his oncologist. Report of PET scan from Nov 2022 reviewed.  I reviewed their notes extensively, he appears to have extensive metastatic disease, present chemoradiation therapy will only be palliative.  Patient has extremely poor quality of life presently, has essentially become bedbound and family members including daughter and wife feel it is extremely difficult for them to continue to care for him and they feel very poorly about this fact.  As patient is asymptomatic without any heart failure, palpitations in spite of A. fib with RVR, no ischemic changes on the EKG, in view of poor nutrition and low K, will not continue digoxin  although I had advised ED MD to administer one dose, his risk for digoxin toxicity is also probably fairly high if you were to develop any renal failure from poor oral intake.  Patient presently unable to eat any solid foods, is able to eat liquid diet due to extensive head and neck surgery.  His appetite is also very poor. No need for telemetry or echo from cardiac standpoint.   We discussed regarding CODE STATUS during the  present hospitalization, he was made DNR as per patient discussion and also I discussed with this patient's wife who is in agreement.  They all feel that he should be in palliative care/hospice care as well.  Patient still would like to think about all this.  I will leave all the decision making to primary team.  I do not need an echocardiogram and this will be canceled.  This was a 75-minute consultation note and end-of-life discussions with the patient and his family as well.  CC: Dr. Si Gaul.   Yates Decamp, MD, [facility] 04/17/2021, 5:58 PM Office: [phone]

## 2021-04-18 ENCOUNTER — Other Ambulatory Visit: Payer: Self-pay | Admitting: Hematology and Oncology

## 2021-04-18 ENCOUNTER — Telehealth: Payer: Self-pay

## 2021-04-18 DIAGNOSIS — I4891 Unspecified atrial fibrillation: Secondary | ICD-10-CM | POA: Diagnosis not present

## 2021-04-18 DIAGNOSIS — I4811 Longstanding persistent atrial fibrillation: Secondary | ICD-10-CM | POA: Diagnosis not present

## 2021-04-18 DIAGNOSIS — Z7189 Other specified counseling: Secondary | ICD-10-CM

## 2021-04-18 DIAGNOSIS — C760 Malignant neoplasm of head, face and neck: Secondary | ICD-10-CM

## 2021-04-18 DIAGNOSIS — Z515 Encounter for palliative care: Secondary | ICD-10-CM

## 2021-04-18 DIAGNOSIS — L89150 Pressure ulcer of sacral region, unstageable: Secondary | ICD-10-CM

## 2021-04-18 LAB — BPAM RBC
Blood Product Expiration Date: 202301032359
Blood Product Expiration Date: 202301032359
ISSUE DATE / TIME: 202212281508
ISSUE DATE / TIME: 202212281821
Unit Type and Rh: 9500
Unit Type and Rh: 9500

## 2021-04-18 LAB — TYPE AND SCREEN
ABO/RH(D): O NEG
Antibody Screen: NEGATIVE
Unit division: 0
Unit division: 0

## 2021-04-18 LAB — OSMOLALITY: Osmolality: 260 mOsm/kg — ABNORMAL LOW (ref 275–295)

## 2021-04-18 LAB — CBC
HCT: 23.3 % — ABNORMAL LOW (ref 39.0–52.0)
Hemoglobin: 7.7 g/dL — ABNORMAL LOW (ref 13.0–17.0)
MCH: 28.5 pg (ref 26.0–34.0)
MCHC: 33 g/dL (ref 30.0–36.0)
MCV: 86.3 fL (ref 80.0–100.0)
Platelets: 63 10*3/uL — ABNORMAL LOW (ref 150–400)
RBC: 2.7 MIL/uL — ABNORMAL LOW (ref 4.22–5.81)
RDW: 20 % — ABNORMAL HIGH (ref 11.5–15.5)
WBC: 2.6 10*3/uL — ABNORMAL LOW (ref 4.0–10.5)
nRBC: 0 % (ref 0.0–0.2)

## 2021-04-18 LAB — MAGNESIUM: Magnesium: 1.6 mg/dL — ABNORMAL LOW (ref 1.7–2.4)

## 2021-04-18 LAB — BASIC METABOLIC PANEL
Anion gap: 10 (ref 5–15)
BUN: 13 mg/dL (ref 8–23)
CO2: 24 mmol/L (ref 22–32)
Calcium: 7.3 mg/dL — ABNORMAL LOW (ref 8.9–10.3)
Chloride: 94 mmol/L — ABNORMAL LOW (ref 98–111)
Creatinine, Ser: 0.51 mg/dL — ABNORMAL LOW (ref 0.61–1.24)
GFR, Estimated: >60 mL/min (ref >60)
Glucose, Bld: 88 mg/dL (ref 70–99)
Potassium: 3.2 mmol/L — ABNORMAL LOW (ref 3.5–5.1)
Sodium: 128 mmol/L — ABNORMAL LOW (ref 135–145)

## 2021-04-18 MED ORDER — POTASSIUM CHLORIDE 20 MEQ PO PACK
40.0000 meq | PACK | Freq: Once | ORAL | Status: AC
  Administered 2021-04-18: 09:00:00 40 meq via ORAL
  Filled 2021-04-18: qty 2

## 2021-04-18 MED ORDER — COLLAGENASE 250 UNIT/GM EX OINT
TOPICAL_OINTMENT | Freq: Two times a day (BID) | CUTANEOUS | Status: DC
Start: 2021-04-18 — End: 2021-04-23
  Administered 2021-04-19: 1 via TOPICAL
  Filled 2021-04-18: qty 30

## 2021-04-18 MED ORDER — POTASSIUM CHLORIDE 10 MEQ/100ML IV SOLN
10.0000 meq | Freq: Once | INTRAVENOUS | Status: AC
  Administered 2021-04-18: 05:00:00 10 meq via INTRAVENOUS
  Filled 2021-04-18: qty 100

## 2021-04-18 MED ORDER — MAGNESIUM SULFATE 2 GM/50ML IV SOLN
2.0000 g | Freq: Once | INTRAVENOUS | Status: AC
  Administered 2021-04-18: 09:00:00 2 g via INTRAVENOUS
  Filled 2021-04-18: qty 50

## 2021-04-18 MED ORDER — MIRTAZAPINE 15 MG PO TBDP
7.5000 mg | ORAL_TABLET | Freq: Every day | ORAL | Status: DC
  Administered 2021-04-18 – 2021-04-26 (×8): 7.5 mg via ORAL
  Filled 2021-04-18 (×12): qty 0.5

## 2021-04-18 MED ORDER — POTASSIUM CHLORIDE CRYS ER 20 MEQ PO TBCR
40.0000 meq | EXTENDED_RELEASE_TABLET | Freq: Once | ORAL | Status: DC
  Filled 2021-04-18: qty 2

## 2021-04-18 NOTE — Consult Note (Addendum)
Consultation Note Date: 04/18/2021   Patient Name: Robert [name]  DOB: 1940/08/28  MRN: [id number]  Age / Sex: 80 y.o., male  PCP: Farris Has, MD Referring Physician: Joseph Art, DO  Reason for Consultation: Establishing goals of care  HPI/Patient Profile: 80 y.o. 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   Head and neck squamous cell carcinoma initially diagnosed in 2019, status post resection. Recurrence in May 2022. He underwent radiation therapy June-August 2022. In October 2022, was found to have metastatic lesions in lung and mediastinal lymph nodes as well as multiple suspicious lever lesions and osseous lesions. He started chemotherapy and immunotherapy in November, with most recent treatment 04/08/21.   Patient has recently had difficulty swallowing solid food and he has been losing weight. Oncology had sent him to IR for PEG placement. This was initially attempted 04/10/21, but patient was found to be in a-fib with RVR so procedure was aborted. It was re-attempted 12/28, but patient was again found to be in a-fib with RVR.    Clinical Assessment and Goals of Care: I have reviewed medical records including EPIC notes, labs and imaging, examined the patient and met at bedside with patient and his wife/Robert [name]  to discuss diagnosis, prognosis, GOC, EOL wishes, disposition, and options.  Patient was recently seen by PMT 04/08/21 at the cancer center.  I re-introduced Palliative Medicine as specialized medical care for people living with serious illness. It focuses on providing relief from the symptoms and stress of a serious illness.   We discussed a brief life review of the patient. He and  Robert [name] have been married for 57 years. They have 2 children and 2 grandchildren. 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 He traveled many places over the years related to his work.   Patient lives at home with his wife. She reports his functional status has "deteriorated" over the past week and he has recently been unable to walk. She reports it is becoming increasingly difficult to care for him at home. She also expresses concern about how his body is tolerating chemotherapy and that it may be "making him weaker".   We discussed patient's current illness and what it means in the larger context of his ongoing co-morbidities. I provided education on the natural disease trajectory of advanced cancer, emphasizing that functional status is generally preserved until late in the disease course, followed by a precipitous decline over weeks to months. Discussed that the onset of decline usually suggests worsening disease. Patient and wife verbalize that understanding that his condition is non-curable and the intent of treatment is palliative.   I attempted to elicit values and goals of care important to the patient. He was hopeful to receive a PEG tube to improve his nutritional status which may help him feel  better and have improved quality of life. During my visit, Dr. Bertis Ruddy (oncology) joins [place] at bedside. On her further questioning, it appears that patient can swallow and does not seem to be aspirating. For this reason, she recommends holding off on PEG placement for now.   At this time, patient and wife are clear that they would like to continue current medical interventions with watchful waiting. They are hopeful his cardiac status will stabilize with medical management. They understand he would not be a candidate for further treatment if it does not stabilize.   Discussed the importance of continued conversation with  patient/family and their medical providers regarding overall plan of care and treatment options, ensuring decisions are within the context of the patients values and GOCs.   Primary decision maker: Patient    SUMMARY OF RECOMMENDATIONS   Continue current supportive care for now Plan to hold off on PEG placement for now Patient/wife are hopeful his cardiac condition will stabilize PMT will continue to follow  Code Status/Advance Care Planning: DNR  Symptom Management:  Mirtazapine 7.5 mg at bedtime for sleep and appetite  Psycho-social/Spiritual:  Created space and opportunity for patient and family to express thoughts and feelings regarding patient's current medical situation.  Emotional support provided   Prognosis:  Unable to determine  Discharge Planning: To Be Determined      Primary Diagnoses: Present on Admission:  Permanent atrial fibrillation (HCC)  A-fib (HCC)   I have reviewed the medical record, interviewed the patient and family, and examined the patient. The following aspects are pertinent.  Past Medical History:  Diagnosis Date   A-fib (HCC)    HTN (hypertension)      Family History  Problem Relation Age of Onset   COPD Mother        Heavy smoker for 62 years   Hypertension Father        uncontrolled   Scheduled Meds:  collagenase   Topical BID   feeding supplement  237 mL Oral TID BM   Continuous Infusions: PRN Meds:.acetaminophen, docusate sodium, metoprolol tartrate, ondansetron (ZOFRAN) IV, prochlorperazine   No Known Allergies Review of Systems  Constitutional:  Positive for appetite change.  Neurological:  Positive for weakness.  Psychiatric/Behavioral:  Positive for sleep disturbance.    Physical Exam Vitals reviewed.  Constitutional:      General: He is not in acute distress.    Appearance: He is ill-appearing.  Cardiovascular:     Rate and Rhythm: Tachycardia present. Rhythm irregularly irregular.  Pulmonary:     Effort:  Pulmonary effort is normal.  Neurological:     Mental Status: He is alert and oriented to person, place, and time.     Motor: Weakness present.  Psychiatric:        Behavior: Behavior normal.    Vital Signs: BP 114/67    Pulse (!) 116    Temp 98.9 F (37.2 C) (Oral)    Resp 18    Ht 6' (1.829 m)    Wt 83.9 kg    SpO2 98%    BMI 25.09 kg/m         SpO2: SpO2: 98 % O2 Device:SpO2: 98 % O2 Flow Rate: .   Palliative Assessment/Data: PPS 30-40%     Time In: 1635 Time Out: 1745 Time Total: 70 minutes Greater than 50%  of this time was spent counseling and coordinating care related to the above assessment and plan.  Signed by: Merry Proud, NP   Please  contact Palliative Medicine Team phone at [phone] for questions and concerns.  For individual provider: See Loretha Stapler

## 2021-04-18 NOTE — Progress Notes (Signed)
Progress Note    Robert [name]  NFA:[id number] DOB: 1941-01-12  DOA: 04/17/2021 PCP: Farris Has, MD    Brief Narrative:    Medical records reviewed and are as summarized below:  Robert [name] is an 80 y.o. 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 fib on Xarelto, chronic diastolic CHF, severe mitral regurgitation, presented with rapid A. Fib and low hgb.    Assessment/Plan:   Principal Problem:   A-fib (HCC) Active Problems:   Permanent atrial fibrillation (HCC)   A. fib with RVR -digoxin given -cards consult appreciated  -BP too low for BB, Cardizem etc -Continue to hold anticoagulation due to severe anemia and severe thrombocytopenia.   Severe hypokalemia -replete to 4   Severe hypomagnesemia -replete to 2   Hyponatremia -trending up   New onset of pancytopenia -Likely related to recent chemotherapy and immunotherapy. -oncology consult -s/p 2 units PRBC   Chronic diastolic CHF -holding lasix -high risk of 3rd spacing with poor PO nutrition   Head and neck metastatic squamous cell carcinoma -poor overall prognosis -defer to oncology consult   Failure to thrive -unable to get PEG Tube -nutrition consult and consideration to getting TPN although increased risk of infection -consult dietitian.    Sacral decubitus -WOC consult -un-stage-able   -POA  Difficult situation.  Poor overall prognosis but patient stated he does not "want to give up".  His oncologist is not available.  Wife was not reachable by phone and not at bedside.   Needs continued GOC as not sure his a fib can be controlled and he is unable to get PEG tube until it is. Also now with sacral wound with poor nutritional status so unlikely to heal especially given his weakness.  Palliative care consult placed    Family Communication/Anticipated D/C date and plan/Code Status   DVT  prophylaxis: Lovenox ordered. Code Status: DNR Family Communication: called wife, no answer Disposition Plan: Status is: Inpatient  Remains inpatient appropriate because: needs nutrition status addressed along with a fib         Medical Consultants:   IR Oncology Cards Palliative care     Subjective:   Says he does not want to give up  Objective:    Vitals:   04/18/21 0815 04/18/21 0816 04/18/21 0817 04/18/21 1100  BP:    103/68  Pulse:  (!) 122 (!) 122 (!) 118  Resp: 18 18 (!) 24 17  Temp:      TempSrc:      SpO2:  95% 94% 100%  Weight:      Height:        Intake/Output Summary (Last 24 hours) at 04/18/2021 1214 Last data filed at 04/18/2021 0556 Gross per 24 hour  Intake 100 ml  Output --  Net 100 ml   Filed Weights   04/18/21 0309  Weight: 83.9 kg    Exam:  General: Appearance:     Overweight - ill appearing male who appears uncomfortable     Lungs:     respirations unlabored- able to lay flat, diminished breath sounds  Heart:    Tachycardic. irregular  MS:   All extremities are intact.  Unstagable decubitus on lower sacrum- covered in white material  Neurologic:   Awake, alert, speech difficult to understand     Data Reviewed:   I have personally reviewed following labs and imaging studies:  Labs: Labs show the  following:   Basic Metabolic Panel: Recent Labs  Lab 04/17/21 1325 04/18/21 0322  NA 126* 128*  K 2.8* 3.2*  CL 91* 94*  CO2 24 24  GLUCOSE 95 88  BUN 17 13  CREATININE 0.65 0.51*  CALCIUM 7.4* 7.3*  MG 1.3* 1.6*   GFR Estimated Creatinine Clearance: 83.5 mL/min (A) (by C-G formula based on SCr of 0.51 mg/dL (L)). Liver Function Tests: Recent Labs  Lab 04/17/21 1325  AST 19  ALT 18  ALKPHOS 81  BILITOT 1.6*  PROT 5.0*  ALBUMIN 2.1*   No results for input(s): LIPASE, AMYLASE in the last 168 hours. No results for input(s): AMMONIA in the last 168 hours. Coagulation profile Recent Labs  Lab 04/17/21 1112  04/17/21 1325  INR 1.2 1.3*    CBC: Recent Labs  Lab 04/17/21 1112 04/17/21 1325 04/18/21 0322  WBC 1.4* 1.4* 2.6*  NEUTROABS  --  1.0*  --   HGB 6.5* 6.2* 7.7*  HCT 19.9* 18.7* 23.3*  MCV 87.3 89.5 86.3  PLT 68* 60* 63*   Cardiac Enzymes: No results for input(s): CKTOTAL, CKMB, CKMBINDEX, TROPONINI in the last 168 hours. BNP (last 3 results) No results for input(s): PROBNP in the last 8760 hours. CBG: No results for input(s): GLUCAP in the last 168 hours. D-Dimer: No results for input(s): DDIMER in the last 72 hours. Hgb A1c: No results for input(s): HGBA1C in the last 72 hours. Lipid Profile: No results for input(s): CHOL, HDL, LDLCALC, TRIG, CHOLHDL, LDLDIRECT in the last 72 hours. Thyroid function studies: No results for input(s): TSH, T4TOTAL, T3FREE, THYROIDAB in the last 72 hours.  Invalid input(s): FREET3 Anemia work up: Recent Labs    04/17/21 1325  VITAMINB12 859  FOLATE 6.3  FERRITIN 986*  TIBC 129*  IRON 45  RETICCTPCT 1.9   Sepsis Labs: Recent Labs  Lab 04/17/21 1112 04/17/21 1325 04/18/21 0322  WBC 1.4* 1.4* 2.6*    Microbiology Recent Results (from the past 240 hour(s))  Resp Panel by RT-PCR (Flu A&B, Covid) Nasopharyngeal Swab     Status: None   Collection Time: 04/17/21  3:40 PM   Specimen: Nasopharyngeal Swab; Nasopharyngeal(NP) swabs in vial transport medium  Result Value Ref Range Status   SARS Coronavirus 2 by RT PCR NEGATIVE NEGATIVE Final    Comment: (NOTE) SARS-CoV-2 target nucleic acids are NOT DETECTED.  The SARS-CoV-2 RNA is generally detectable in upper respiratory specimens during the acute phase of infection. The lowest concentration of SARS-CoV-2 viral copies this assay can detect is 138 copies/mL. A negative result does not preclude SARS-Cov-2 infection and should not be used as the sole basis for treatment or other patient management decisions. A negative result may occur with  improper specimen  collection/handling, submission of specimen other than nasopharyngeal swab, presence of viral mutation(s) within the areas targeted by this assay, and inadequate number of viral copies(<138 copies/mL). A negative result must be combined with clinical observations, patient history, and epidemiological information. The expected result is Negative.  Fact Sheet for Patients:  BloggerCourse.com  Fact Sheet for Healthcare Providers:  SeriousBroker.it  This test is no t yet approved or cleared by the [place] FDA and  has been authorized for detection and/or diagnosis of SARS-CoV-2 by FDA under an Emergency Use Authorization (EUA). This EUA will remain  in effect (meaning this test can be used) for the duration of the COVID-19 declaration under Section 564(b)(1) of the Act, 21 U.S.C.section 360bbb-3(b)(1), unless the authorization is terminated  or revoked sooner.       Influenza A by PCR NEGATIVE NEGATIVE Final   Influenza B by PCR NEGATIVE NEGATIVE Final    Comment: (NOTE) The Xpert Xpress SARS-CoV-2/FLU/RSV plus assay is intended as an aid in the diagnosis of influenza from Nasopharyngeal swab specimens and should not be used as a sole basis for treatment. Nasal washings and aspirates are unacceptable for Xpert Xpress SARS-CoV-2/FLU/RSV testing.  Fact Sheet for Patients: BloggerCourse.com  Fact Sheet for Healthcare Providers: SeriousBroker.it  This test is not yet approved or cleared by the [place] FDA and has been authorized for detection and/or diagnosis of SARS-CoV-2 by FDA under an Emergency Use Authorization (EUA). This EUA will remain in effect (meaning this test can be used) for the duration of the COVID-19 declaration under Section 564(b)(1) of the Act, 21 U.S.C. section 360bbb-3(b)(1), unless the authorization is terminated or revoked.  Performed at [facility], [address], [place], [place] [address]     Procedures and diagnostic studies:  DG Chest Portable 1 View  Result Date: 04/17/2021 CLINICAL DATA:  Malnutrition. EXAM: PORTABLE CHEST 1 VIEW COMPARISON:  One view chest x-ray 04/10/2021 FINDINGS: The heart is enlarged. Mild edema again noted. Small effusions are present. Patchy airspace opacities are again noted in the right mid lung and left lower lobe. IMPRESSION: 1. Stable cardiomegaly and mild edema. 2. Small bilateral pleural effusions. 3. Patchy bilateral airspace disease likely reflects atelectasis. Infection is not excluded. Electronically Signed   By: Marin Roberts M.D.   On: 04/17/2021 12:58    Medications:    collagenase   Topical BID   feeding supplement  237 mL Oral TID BM   Continuous Infusions:   LOS: 1 day   Joseph Art  [facility]   How to contact the [facility] Attending or Consulting provider 7A - 7P or covering provider during after hours 7P -7A, for this patient?  Check the care team in [facility] and look for a) attending/consulting [facility] provider listed and b) the [facility] team listed Log into [email or web address] and use [place]'s universal password to access. If you do not have the password, please contact the hospital operator. Locate the [facility] provider you are looking for under [facility] and page to a number that you can be directly reached. If you still have difficulty reaching the provider, please page the [facility] (Director on Call) for the Hospitalists listed on amion for assistance.  04/18/2021, 12:14 PM

## 2021-04-18 NOTE — Telephone Encounter (Signed)
Opened in error

## 2021-04-18 NOTE — ED Notes (Signed)
When attempted to place DNR bracelet on pt's wrist, pt refused and stated he is not a DNR; pt states "I am not ready to go." Informed pt that he needs to discuss his wishes with his physician so that his wishes are known and clear.

## 2021-04-18 NOTE — Plan of Care (Signed)
  Problem: Nutrition: Goal: Adequate nutrition will be maintained Outcome: Progressing   Problem: Pain Managment: Goal: General experience of comfort will improve Outcome: Progressing   Problem: Safety: Goal: Ability to remain free from injury will improve Outcome: Progressing   

## 2021-04-18 NOTE — Progress Notes (Signed)
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 The patient is not sure about this and would like to discuss further with family I recommend we continue supportive care for now  Failure to thrive, malnutrition Feeding tube placement was recommended in the outpatient setting for additional supportive measures while he is undergoing treatment.  On further questioning today, it appears that the patient can swallow without difficulties and did not have any physical barriers to eating.  For this reason, I recommend holding off placement of feeding tube while he is in the hospital for the next few days  Atrial fibrillation with rapid ventricular response Continue medical management for now It is not clear to me whether this will improve even with aggressive medical management The patient is made aware that he will not be a candidate for further treatment if his cardiac function does not improve  Code Status It appears that his CODE STATUS was addressed by primary service  Goals of care Stability of his cardiac function and supportive care for now I agree with palliative care team involvement  Discharge planning Unknown, will likely be here for the next 3 to 5 days The patient and family is provided my contact information Dr. Arbutus Ped will return to take care of him next week  All questions were  answered. The patient knows to call the clinic with any problems, questions or concerns.   The total time spent in the appointment was 40 minutes encounter with patients including review of chart and various tests results, discussions about plan of care and coordination of care plan  Artis Delay, MD 04/18/2021 5:54 PM  Subjective:  This is a patient of Dr. Arbutus Ped with widespread metastatic squamous cell carcinoma thought to be head and neck versus lung in origin, and has been receiving palliative chemotherapy with carboplatin, paclitaxel and pembrolizumab.  His last cycle received was on April 08, 2021.  His treatment course was complicated with profound weakness and difficulties with eating.  The patient also became pancytopenic requiring transfusion support.  With his progressive weakness, he was seen by nutritionist in the oncology outpatient and overall consensus was to recommend placement of feeding tube Unfortunately, the feeding tube placement was canceled twice due to atrial fibrillation with rapid ventricular response. He is being admitted to the hospital with atrial fibrillation with rapid ventricular response again.  According to the patient, he can swallow liquids and food with mashed potato consistency but have difficulties maintaining adequate nutritional intake.  He denies choking.  He denies physical pain when he swallows.  In fact, he denies pain.  He has some shortness of breath on minimal exertion.  No nausea.  Wife is by the bedside The patient denies any recent signs or symptoms of bleeding such as spontaneous epistaxis, hematuria or hematochezia. I was asked to see the patient and to clarify goals of care  Objective:  Vitals:   04/18/21 1400 04/18/21 1641  BP: 114/67 106/62  Pulse: (!) 116 (!) 127  Resp: 18 (!) 21  Temp:  98.5 F (36.9 C)  SpO2: 98% 98%     Intake/Output Summary (Last 24 hours) at 04/18/2021 1754 Last data filed at 04/18/2021 1717 Gross per 24 hour   Intake 160 ml  Output --  Net 160 ml    GENERAL:alert, no distress and comfortable.  Obvious facial deformity from head and neck cancer NEURO: alert & oriented, no focal motor/sensory deficits   Labs:  Recent Labs    03/18/21 0900 04/08/21 0811 04/10/21 1506 04/17/21 1325 04/18/21 0322  NA 132* 135 133* 126* 128*  K 2.8* 3.4* 3.5 2.8* 3.2*  CL 97* 99 100 91* 94*  CO2 25 25 27 24 24   GLUCOSE 120* 141* 99 95 88  BUN 13 10 15 17 13   CREATININE 0.67 0.71 0.65 0.65 0.51*  CALCIUM 7.9* 8.3* 7.4* 7.4* 7.3*  GFRNONAA >60 >60 >60 >60 >60  PROT 5.6* 6.0*  --  5.0*  --   ALBUMIN 2.1* 2.3*  --  2.1*  --   AST 36 15  --  19  --   ALT 36 14  --  18  --   ALKPHOS 104 117  --  81  --   BILITOT 1.4* 1.1  --  1.6*  --     Studies: I personally reviewed his latest PET CT imaging  IR Fluoro Rm 30-60 Min  Result Date: 04/10/2021 CLINICAL DATA:  80 year old male with history of metastatic squamous cell carcinoma. Percutaneous gastrostomy tube requested for supplemental nutrition. EXAM: IR FLUORO RM 0-60 MIN ANESTHESIA/SEDATION: None. MEDICATIONS: None. CONTRAST:  None. PROCEDURE: None. COMPLICATIONS: None immediate FINDINGS: Immediately prior to procedure, the patient was noted to be in atrial fibrillation with rapid ventricular response. Systolic blood pressures were in the 90s, similar to his recorded baseline. The patient was asymptomatic. Patient was given 500 mL normal saline bolus without change in atrial fibrillation. An EKG was obtained. The patient was then transferred to the emergency department for further evaluation. IMPRESSION: Gastrostomy tube placement aborted due to new onset atrial fibrillation with RVR. The patient was transferred to the emergency department in stable condition. PLAN: Gastrostomy tube placement on an outpatient basis can be rescheduled at any time. Marliss Coots, MD [facility] Radiology Electronically Signed   By:  Marliss Coots M.D.   On: 04/10/2021 16:57   DG Chest Portable 1 View  Result Date: 04/17/2021 CLINICAL DATA:  Malnutrition. EXAM: PORTABLE CHEST 1 VIEW COMPARISON:  One view chest x-ray 04/10/2021 FINDINGS: The heart is enlarged. Mild edema again noted. Small effusions are present. Patchy airspace opacities are again noted in the right mid lung and left lower lobe. IMPRESSION: 1. Stable cardiomegaly and mild edema. 2. Small bilateral pleural effusions. 3. Patchy bilateral airspace disease likely reflects atelectasis. Infection is not excluded. Electronically Signed   By: Marin Roberts M.D.   On: 04/17/2021 12:58   DG Chest Port 1 View  Result Date: 04/10/2021 CLINICAL DATA:  atrial fib.  Hypertension EXAM: PORTABLE CHEST 1 VIEW COMPARISON:  CT chest 02/09/2021, CT PET 03/04/2021 FINDINGS: The heart and mediastinal contours are within normal limits. Low lung volumes. Biapical pleural/pulmonary scarring. Patchy airspace opacity and nodularities consistent with known non-small cell lung cancer question 1 cm cavitary lesion at the right base. No pulmonary edema. Blunting of bilateral costophrenic angles likely representing trace pleural effusions. No pneumothorax. No acute osseous abnormality. IMPRESSION: 1. Low  lung volumes with patchy airspace opacity and nodularities consistent with known non-small cell lung cancer. Superimposed infection/inflammation not excluded. 2. Question 1 cm cavitary lesion at the right base. Electronically Signed   By: Tish Frederickson M.D.   On: 04/10/2021 15:40

## 2021-04-18 NOTE — Consult Note (Signed)
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  Periwound: Intact Dressing procedure/placement/frequency: Apply a nickel thick layer of Santyl on the wound, cover with moistened saline gauze, dry gauze and secure with sacral foam dressing or ABD pad and tape. Change twice daily.  Change patient to air mattress. Turn q2h.  Monitor the wound area(s) for worsening of condition such as: Signs/symptoms of infection, increase in size, development of or worsening of odor, development of pain, or increased pain at the affected locations.   Notify the medical team if any of these develop.  Thank you for the consult. [place] nurse will not follow at this time.   Please re-consult the [place] team if needed.  Cathlean Marseilles Tamala Julian, MSN, RN, [place], Lysle Pearl, [facility] Wound Treatment Associate Pager [phone]

## 2021-04-18 NOTE — Progress Notes (Signed)
Request received for G tube placement.   She presented to IR on 12/21 for G tube placement as a outpatient, found to be in a-fib with RVR, the procedure was canceled.  She presented to IR again yesterday for the G tube placement, she was found to have A-fib with RVR and also anemia with hgb og 6.5, patient was sent to ED for evaluation and management.   Patient is being admitted for further evaluation and management of a-fib with RVR and anemia.   She still has tachycardia with HR in 140s and tachypenea.  Discussed with Dr. Maryelizabeth Kaufmann, pt still too unstable for G tube placement.  Will monitor her vital signs for possible G tube placement next week.   Ordering MD notified via secure chat.   Armando Gang Aliviyah Malanga PA-C 04/18/2021 11:08 AM

## 2021-04-18 NOTE — Plan of Care (Signed)
°  Problem: Education: Goal: Knowledge of disease or condition will improve Outcome: Progressing Goal: Understanding of medication regimen will improve Outcome: Progressing Goal: Individualized Educational Video(s) Outcome: Progressing   Problem: Activity: Goal: Ability to tolerate increased activity will improve Outcome: Progressing   Problem: Cardiac: Goal: Ability to achieve and maintain adequate cardiopulmonary perfusion will improve Outcome: Progressing   Problem: Education: Goal: Knowledge of General Education information will improve Description: Including pain rating scale, medication(s)/side effects and non-pharmacologic comfort measures Outcome: Progressing

## 2021-04-18 NOTE — Progress Notes (Signed)
PT Evaluation Note    04/18/21 0954  PT Visit Information  Last PT Received On 04/18/21  Assistance Needed +2  History of Present Illness Pt is a 80 y/o male admitted secondary to a fib and low Hgb. Was scheduled for g-tube, but unable secondary to medical issues. PMH includes d CHF, metastatic squamous cell cancer, oral cancer, and a fib.  Precautions  Precautions Fall  Restrictions  Weight Bearing Restrictions No  Home Living  Family/patient expects to be discharged to: Private residence  Living Arrangements Spouse/significant other  Available Help at Discharge Family;Available 24 hours/day  Type of Home House  Home Access Stairs to enter  Entrance Stairs-Number of Steps 2  Entrance Stairs-Rails None  Home Layout One level  Bathroom Shower/Tub Walk-in shower  [place] (2 wheels);Transport chair;Other (comment) (bars over toilet)  Prior Function  Prior Level of Function  Needs assist  Mobility Comments Using RW for the last couple weeks.  ADLs Comments Has required some assist the last few weeks with bathing  Communication  Communication Expressive difficulties (difficult to understand at times secondary to oral cancer)  Pain Assessment  Pain Assessment Faces  Faces Pain Scale 6  Pain Location bottom  Pain Descriptors / Indicators Grimacing;Guarding  Pain Intervention(s) Limited activity within patient's tolerance;Monitored during session;Repositioned  Cognition  Arousal/Alertness Awake/alert  Behavior During Therapy [facility] for tasks assessed/performed  Overall Cognitive Status No family/caregiver present to determine baseline cognitive functioning  Upper Extremity Assessment  Upper Extremity Assessment Defer to OT evaluation  Lower Extremity Assessment  Lower Extremity Assessment Generalized weakness  Cervical / Trunk Assessment  Cervical / Trunk Assessment Kyphotic;Other exceptions  Cervical / Trunk Exceptions  large sacral wound noted  Bed Mobility  Overal bed mobility Needs Assistance  Bed Mobility Supine to Sit;Sit to Supine  Supine to sit Mod assist  Sit to supine Mod assist  General bed mobility comments Required mod A for trunk and LE assist. Pt reporting increased dizziness. HR elevated to 147 and BP at 93/60. returned to supine. Pt rolled from side to side for clean up. Discovered pressure wound at sacrum; RN and MD present.  Balance  Overall balance assessment Needs assistance  Sitting-balance support No upper extremity supported;Feet supported  Sitting balance-Leahy Scale Fair  PT - End of Session  Activity Tolerance Treatment limited secondary to medical complications (Comment)  Patient left in bed;with call bell/phone within reach (on stretcher in ED)  Nurse Communication Mobility status  PT Assessment  PT Recommendation/Assessment Patient needs continued PT services  PT Visit Diagnosis Unsteadiness on feet (R26.81);Muscle weakness (generalized) (M62.81);Difficulty in walking, not elsewhere classified (R26.2)  PT Problem List Decreased strength;Decreased balance;Decreased mobility;Decreased activity tolerance;Decreased knowledge of use of DME;Decreased safety awareness;Decreased knowledge of precautions;Decreased cognition  PT Plan  PT Frequency (ACUTE ONLY) Min 2X/week  PT Treatment/Interventions (ACUTE ONLY) DME instruction;Gait training;Functional mobility training;Therapeutic exercise;Therapeutic activities;Balance training;Patient/family education  AM-PAC PT "6 Clicks" Mobility Outcome Measure (Version 2)  Help needed turning from your back to your side while in a flat bed without using bedrails? 2  Help needed moving from lying on your back to sitting on the side of a flat bed without using bedrails? 2  Help needed moving to and from a bed to a chair (including a wheelchair)? 2  Help needed standing up from a chair using your arms (e.g., wheelchair or bedside chair)? 2  Help  needed to walk in hospital room? 2  Help needed climbing  3-5 steps with a railing?  1  6 Click Score 11  Consider Recommendation of Discharge To: CIR/SNF/LTACH  Progressive Mobility  What is the highest level of mobility based on the progressive mobility assessment? Level 2 ([place]) - Balance while sitting on edge of bed and cannot stand  Mobility Sit up in bed/chair position for meals  PT Recommendation  Follow Up Recommendations Skilled nursing-short term rehab (<3 hours/day)  Assistance recommended at discharge Frequent or constant Supervision/Assistance  Functional Status Assessment Patient has had a recent decline in their functional status and demonstrates the ability to make significant improvements in function in a reasonable and predictable amount of time.  PT equipment Hospital bed  Individuals Consulted  Consulted and Agree with Results and Recommendations Patient  Acute Rehab PT Goals  Patient Stated Goal to get g tube in and go home  PT Goal Formulation With patient  Time For Goal Achievement 05/02/21  Potential to Achieve Goals Good  PT Time Calculation  PT Start Time (ACUTE ONLY) 0952  PT Stop Time (ACUTE ONLY) 1024  PT Time Calculation (min) (ACUTE ONLY) 32 min  PT General Charges  $$ ACUTE PT VISIT 1 Visit  PT Evaluation  $PT Eval Moderate Complexity 1 Mod  PT Treatments  $Therapeutic Activity 8-22 mins   Pt admitted secondary to problem above with deficits above. Pt limited this session secondary to hypotension and increased HR. Required mod A for bed mobility. Upon sitting, pt with HR up to 147 and BP at 93/60. MD and RN aware. Also found pressure injury in sacral area during clean up following BP; RN and MD aware. Recommending SNF level therapies at d/c. Will continue to follow acutely.   Reuel Derby, PT, DPT  Acute Rehabilitation Services  Pager: [phone] Office: [phone]

## 2021-04-19 DIAGNOSIS — R638 Other symptoms and signs concerning food and fluid intake: Secondary | ICD-10-CM

## 2021-04-19 LAB — CBC
HCT: 24.5 % — ABNORMAL LOW (ref 39.0–52.0)
Hemoglobin: 8 g/dL — ABNORMAL LOW (ref 13.0–17.0)
MCH: 28.1 pg (ref 26.0–34.0)
MCHC: 32.7 g/dL (ref 30.0–36.0)
MCV: 86 fL (ref 80.0–100.0)
Platelets: 67 10*3/uL — ABNORMAL LOW (ref 150–400)
RBC: 2.85 MIL/uL — ABNORMAL LOW (ref 4.22–5.81)
RDW: 19.9 % — ABNORMAL HIGH (ref 11.5–15.5)
WBC: 4.6 10*3/uL (ref 4.0–10.5)
nRBC: 0 % (ref 0.0–0.2)

## 2021-04-19 LAB — BASIC METABOLIC PANEL
Anion gap: 6 (ref 5–15)
BUN: 10 mg/dL (ref 8–23)
CO2: 25 mmol/L (ref 22–32)
Calcium: 7.2 mg/dL — ABNORMAL LOW (ref 8.9–10.3)
Chloride: 93 mmol/L — ABNORMAL LOW (ref 98–111)
Creatinine, Ser: 0.45 mg/dL — ABNORMAL LOW (ref 0.61–1.24)
GFR, Estimated: >60 mL/min (ref >60)
Glucose, Bld: 104 mg/dL — ABNORMAL HIGH (ref 70–99)
Potassium: 3.6 mmol/L (ref 3.5–5.1)
Sodium: 124 mmol/L — ABNORMAL LOW (ref 135–145)

## 2021-04-19 LAB — MAGNESIUM: Magnesium: 1.5 mg/dL — ABNORMAL LOW (ref 1.7–2.4)

## 2021-04-19 LAB — PHOSPHORUS: Phosphorus: 1.9 mg/dL — ABNORMAL LOW (ref 2.5–4.6)

## 2021-04-19 MED ORDER — METOPROLOL TARTRATE 12.5 MG HALF TABLET
12.5000 mg | ORAL_TABLET | Freq: Two times a day (BID) | ORAL | Status: DC
  Administered 2021-04-19 – 2021-04-21 (×5): 12.5 mg via ORAL
  Filled 2021-04-19 (×5): qty 1

## 2021-04-19 MED ORDER — ADULT MULTIVITAMIN W/MINERALS CH
1.0000 | ORAL_TABLET | Freq: Every day | ORAL | Status: DC
  Administered 2021-04-19 – 2021-04-26 (×7): 1 via ORAL
  Filled 2021-04-19 (×8): qty 1

## 2021-04-19 MED ORDER — JUVEN PO PACK
1.0000 | PACK | Freq: Two times a day (BID) | ORAL | Status: DC
  Administered 2021-04-19 – 2021-04-26 (×12): 1 via ORAL
  Filled 2021-04-19 (×17): qty 1

## 2021-04-19 MED ORDER — POTASSIUM & SODIUM PHOSPHATES 280-160-250 MG PO PACK
1.0000 | PACK | Freq: Three times a day (TID) | ORAL | Status: DC
  Administered 2021-04-19 – 2021-04-22 (×11): 1 via ORAL
  Filled 2021-04-19 (×14): qty 1

## 2021-04-19 MED ORDER — MAGNESIUM SULFATE 4 GM/100ML IV SOLN
4.0000 g | Freq: Once | INTRAVENOUS | Status: AC
  Administered 2021-04-19: 10:00:00 4 g via INTRAVENOUS
  Filled 2021-04-19: qty 100

## 2021-04-19 NOTE — Progress Notes (Signed)
RE:  Robert [name]      Date of Birth:  October 26, 1940     Date:   04/19/2021       To Whom It May Concern:  Please be advised that the above-named patient will require a short-term nursing home stay - anticipated 30 days or less for rehabilitation and strengthening.  The plan is for return home.

## 2021-04-19 NOTE — Care Management Important Message (Signed)
Important Message  Patient Details  Name: Robert [name] MRN: [id number] Date of Birth: 1940-06-15   Medicare Important Message Given:  Yes     [name] 04/19/2021, 2:02 PM

## 2021-04-19 NOTE — Evaluation (Signed)
Occupational Therapy Evaluation Patient Details Name: Robert [name] MRN: [id number] DOB: 1941-01-30 Today's Date: 04/19/2021   History of Present Illness Pt is a 80 y/o male admitted secondary to a fib and low Hgb. Was scheduled for g-tube, but unable secondary to medical issues. PMH includes d CHF, metastatic squamous cell cancer, oral cancer, and a fib.   Clinical Impression   PTA, pt lives with spouse, ambulatory (recently using RW) and typically Independent with ADLs though has required assist for showering tasks due to progressive weakness. Pt presents now with deficits in cardiopulmonary tolerance, strength and balance. Eval limited to bed level due to HR though BP improved with HOB elevation. Pt able to assist with bed mobility to pull self up with Mod A. Pt requires Min A for UB ADL and Total A for LB ADL due to deficits. Hope to progress to OOB ADL in next session pending a fib improvements. At this time, would recommend SNF rehab prior to return home.      Recommendations for follow up therapy are one component of a multi-disciplinary discharge planning process, led by the attending physician.  Recommendations may be updated based on patient status, additional functional criteria and insurance authorization.   Follow Up Recommendations  Skilled nursing-short term rehab (<3 hours/day)    Assistance Recommended at Discharge Frequent or constant Supervision/Assistance  Functional Status Assessment  Patient has had a recent decline in their functional status and demonstrates the ability to make significant improvements in function in a reasonable and predictable amount of time.  Equipment Recommendations  Other (comment) (defer to next venue)    Recommendations for Other Services       Precautions / Restrictions Precautions Precautions: Fall;Other (comment) Precaution Comments: monitor HR, soft BP Restrictions Weight Bearing Restrictions: No      Mobility Bed  Mobility Overal bed mobility: Needs Assistance             General bed mobility comments: able to assist in pulling self up in bed via bridging and pulling bed rails at Mod A. deferred further EOB attempts due to HR    Transfers                   General transfer comment: deferred due to HR      Balance                                           ADL either performed or assessed with clinical judgement   ADL Overall ADL's : Needs assistance/impaired Eating/Feeding: Set up;Bed level   Grooming: Set up;Bed level;Wash/dry face Grooming Details (indicate cue type and reason): no difficulty reaching to face Upper Body Bathing: Minimal assistance;Bed level   Lower Body Bathing: Maximal assistance;Bed level   Upper Body Dressing : Minimal assistance;Bed level   Lower Body Dressing: Maximal assistance;Bed level       Toileting- Clothing Manipulation and Hygiene: Total assistance;Bed level         General ADL Comments: Limited to bed level assessment due to a fib with tachycardic readings intermittently. began energy conservation education     Vision Baseline Vision/History: 0 No visual deficits Ability to See in Adequate Light: 0 Adequate Patient Visual Report: No change from baseline Vision Assessment?: No apparent visual deficits     Perception     Praxis      Pertinent Vitals/Pain Pain Assessment: No/denies  pain Pain Intervention(s): Monitored during session     Hand Dominance Right   Extremity/Trunk Assessment Upper Extremity Assessment Upper Extremity Assessment: Generalized weakness   Lower Extremity Assessment Lower Extremity Assessment: Defer to PT evaluation   Cervical / Trunk Assessment Cervical / Trunk Assessment: Kyphotic;Other exceptions Cervical / Trunk Exceptions: large sacral wound noted   Communication Communication Communication: Expressive difficulties;HOH (difficult to understand at times secondary to oral  cancer)   Cognition Arousal/Alertness: Awake/alert Behavior During Therapy: WFL for tasks assessed/performed Overall Cognitive Status: No family/caregiver present to determine baseline cognitive functioning                                 General Comments: pleasant, answers questions appropriately, and demo insight into situation. not formally assessed but likely at cognitive baseline     General Comments  SpO2 92% on RA, BP 99/59 at rest, with increased HOB, 107/78. HR a fib/tachy with readings up to 175bpm not correlated with activtiy, but noted with talking    Exercises     Shoulder Instructions      Home Living Family/patient expects to be discharged to:: Private residence Living Arrangements: Spouse/significant other Available Help at Discharge: Family;Available 24 hours/day Type of Home: House Home Access: Stairs to enter [employer] of Steps: 2 Entrance Stairs-Rails: None Home Layout: One level     Bathroom Shower/Tub: Producer, television/film/video: Handicapped height     Home Equipment: Agricultural consultant (2 wheels);Transport chair;Other (comment);Shower seat;Hand held shower head (bars over toilet)          Prior Functioning/Environment Prior Level of Function : Needs assist             Mobility Comments: Using RW for the last couple weeks. Endorses one fall last week - tripped over a step ADLs Comments: typically Independent with ADLs, required some assist with showering tasks recently due to weakness/fatigue        OT Problem List: Decreased strength;Decreased activity tolerance;Impaired balance (sitting and/or standing);Cardiopulmonary status limiting activity      OT Treatment/Interventions: Self-care/ADL training;Therapeutic exercise;Energy conservation;DME and/or AE instruction;Therapeutic activities;Patient/family education;Balance training    OT Goals(Current goals can be found in the care plan section) Acute Rehab OT  Goals Patient Stated Goal: resolve HR issues to get feeding tube OT Goal Formulation: With patient Time For Goal Achievement: 05/03/21 Potential to Achieve Goals: Good  OT Frequency: Min 2X/week   Barriers to D/C:            Co-evaluation              AM-PAC OT "6 Clicks" Daily Activity     Outcome Measure Help from another person eating meals?: A Little Help from another person taking care of personal grooming?: A Little Help from another person toileting, which includes using toliet, bedpan, or urinal?: Total Help from another person bathing (including washing, rinsing, drying)?: A Lot Help from another person to put on and taking off regular upper body clothing?: A Little Help from another person to put on and taking off regular lower body clothing?: A Lot 6 Click Score: 14   End of Session Nurse Communication: Mobility status;Other (comment) (HR)  Activity Tolerance: Treatment limited secondary to medical complications (Comment) Patient left: in bed;with call bell/phone within reach;with bed alarm set  OT Visit Diagnosis: Other abnormalities of gait and mobility (R26.89);Muscle weakness (generalized) (M62.81)  Time: 1610-9604 OT Time Calculation (min): 19 min Charges:  OT General Charges $OT Visit: 1 Visit OT Evaluation $OT Eval Moderate Complexity: 1 Mod  Robert [name], OTR/L Acute Rehab Services Office: [phone]   Lorre Munroe 04/19/2021, 8:26 AM

## 2021-04-19 NOTE — Progress Notes (Signed)
Initial Nutrition Assessment  DOCUMENTATION CODES:  Not applicable  INTERVENTION:  Continue to advance diet as medically able and as tolerated.  Discontinue Ensure TID.  Add Vital Cuisine Shake TID, each supplement provides 520 kcal and 22 grams of protein.  Add Magic cup TID with meals, each supplement provides 290 kcal and 9 grams of protein   Add 1 packet Juven BID, each packet provides 95 calories, 2.5 grams of protein (collagen), and 9.8 grams of carbohydrate (3 grams sugar); also contains 7 grams of L-arginine and L-glutamine, 300 mg vitamin C, 15 mg vitamin E, 1.2 mcg vitamin B-12, 9.5 mg zinc, 200 mg calcium, and 1.5 g  Calcium Beta-hydroxy-Beta-methylbutyrate to support wound healing.  Add MVI with minerals daily.  Encourage PO and supplement intake.  NUTRITION DIAGNOSIS:  Increased nutrient needs related to cancer and cancer related treatments as evidenced by estimated needs.  GOAL:  Patient will meet greater than or equal to 90% of their needs  MONITOR:  Diet advancement, PO intake, Supplement acceptance, Labs, Weight trends, Skin, I & O's  REASON FOR ASSESSMENT:  Consult Assessment of nutrition requirement/status, Poor PO  ASSESSMENT:  80 yo male with a PMH of metastatic squamous cell carcinoma head and neck, metastatic to lung and mediastinal lymph node, chronic A. fib on Xarelto, chronic diastolic CHF, severe mitral regurgitation, presented with rapid A. Fib and low hgb. Admitted with Afib with RVR.  RD working remotely. Attempted to call patient's room phone. Pt did not answer.  Plan for G-tube placement but unable to place due to uncontrolled A-fib. MD reached out to RD to assess for TPN.  Discussed situation with MD. Pt with overall poor prognosis. Pt would want TPN as he "does not want to give up." RD and MD agree that TPN would not provide any meaningful recovery for patient given prognosis.  Awaiting Oncology and Palliative Care to assess patient.  Of  note, pt on full liquids now. Per Epic, pt ate 50% of dinner last night.  Pt's weight history is inconsistent. Given patient's history of CHF, some of this may be fluid shifts. Given that pt does not have edema at present, pt's likely dry weight is around 77.3 kg.  Pt is likely malnourished to some degree, however, RD cannot definitively diagnose at this time.  MD reports that pt can swallow and would like to approach nutrition supplements first before starting TPN.  RD to order very calorically dense supplement ([employer] shakes), Juven for wound healing, and MVI with minerals daily.  Supplements: Ensure TID (refusing)  Medications: reviewed; Remeron, Mag Sulfate once per IV  Labs: reviewed; Na 124 (L), Glucose 104 (H), Crt 0.45 (L - trending down), Phos 1.9 (L), Mag 1.5 (L - repletion in progress)  NUTRITION - FOCUSED PHYSICAL EXAM: Unable to perform - defer to follow-up  Diet Order:   Diet Order             Diet full liquid Room service appropriate? Yes; Fluid consistency: Thin  Diet effective now                  EDUCATION NEEDS:  Education needs have been addressed  Skin:  Skin Assessment: Skin Integrity Issues: Skin Integrity Issues:: Unstageable Unstageable: Sacrum  Last BM:  04/18/21 - Type 6, medium  Height:  Ht Readings from Last 1 Encounters:  04/18/21 6' (1.829 m)   Weight:  Wt Readings from Last 1 Encounters:  04/19/21 77.3 kg   BMI:  Body mass index is  23.11 kg/m.  Estimated Nutritional Needs:  Kcal:  2200-2400 Protein:  100-115 grams Fluid:  >2.2 L  Derrel Nip, RD, LDN (she/her/hers) Clinical Inpatient Dietitian RD Pager/After-Hours/Weekend Pager # in [place]

## 2021-04-19 NOTE — Progress Notes (Signed)
Progress Note    Jobany Watrous  XBJ:[id number] DOB: 1940-10-04  DOA: 04/17/2021 PCP: Farris Has, MD    Brief Narrative:    Medical records reviewed and are as summarized below:  Anchor Huisinga is an 80 y.o. 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 fib on Xarelto, chronic diastolic CHF, severe mitral regurgitation, presented with rapid A. Fib and low hgb.  Patient's HR has been difficult to control.  Slow to improve. Many medical issues.  He and family are re-considering if he wants the G tube or not- will re-assess on Monday.   Assessment/Plan:   Principal Problem:   Atrial fibrillation with RVR (HCC) Active Problems:   Permanent atrial fibrillation (HCC)   A. fib with RVR -digoxin given -cards consult appreciated  -low dose BB as BP seems improved -Continue to hold anticoagulation due to severe anemia and severe thrombocytopenia.   Severe hypokalemia -replete to 4   Severe hypomagnesemia -replete to 2   Hyponatremia -await urine studies   New onset of pancytopenia -Likely related to recent chemotherapy and immunotherapy. -oncology consult -s/p 2 units PRBC   Chronic diastolic CHF -holding lasix due to hypotension -high risk of 3rd spacing with poor PO nutrition   Head and neck metastatic squamous cell carcinoma -poor overall prognosis -defer to oncology consult   Failure to thrive -unable to get PEG Tube- family and wife are re-considering if he wants that -consult dietitian.   Pressure Injury 04/18/21 Sacrum Unstageable - Full thickness tissue loss in which the base of the injury is covered by slough (yellow, tan, gray, green or brown) and/or eschar (tan, brown or black) in the wound bed. yellow slough in wound bed---see [facility] (Active)  04/18/21 1550 (see WOCRN progress note)  Location: Sacrum  Location Orientation:   Staging: Unstageable -  Full thickness tissue loss in which the base of the injury is covered by slough (yellow, tan, gray, green or brown) and/or eschar (tan, brown or black) in the wound bed. (see WOCRN progress note)  Wound Description (Comments): yellow slough in wound bed---see [facility] progress note for details and measurements  Present on Admission: Yes  Sacral decubitus -WOC consult -un-stage-able   -POA  Difficult situation.  Will continue with medical care sans CPR/intubation and if he does not continue to improve, can re-assess with palliative care    Family Communication/Anticipated D/C date and plan/Code Status   DVT prophylaxis: scd Code Status: DNR Family Communication: wife at bedside Disposition Plan: Status is: Inpatient  Remains inpatient appropriate because: needs nutrition status addressed along with a fib         Medical Consultants:   IR Oncology Cards Palliative care     Subjective:   Able to eat-- slept 12 hours   Objective:    Vitals:   04/19/21 0919 04/19/21 1000 04/19/21 1100 04/19/21 1114  BP: (!) 99/56 101/64 117/80   Pulse: (!) 112 (!) 119 (!) 119   Resp: 18 20 (!) 22   Temp:    98.5 F (36.9 C)  TempSrc:    Oral  SpO2: 97% 95% 100%   Weight:      Height:        Intake/Output Summary (Last 24 hours) at 04/19/2021 1406 Last data filed at 04/19/2021 1319 Gross per 24 hour  Intake 837 ml  Output 175 ml  Net 662 ml   Filed Weights   04/18/21  0309 04/19/21 0010  Weight: 83.9 kg 77.3 kg    Exam:  General: Appearance:    Chronically ill appearing male in no acute distress     Lungs:     Clear to auscultation bilaterally  Heart:    Tachycardic.   MS:   All extremities are intact.    Neurologic:   Awake, alert, speech can be difficult to understand       Data Reviewed:   I have personally reviewed following labs and imaging studies:  Labs: Labs show the following:   Basic Metabolic Panel: Recent Labs  Lab 04/17/21 1325  04/18/21 0322 04/19/21 0312  NA 126* 128* 124*  K 2.8* 3.2* 3.6  CL 91* 94* 93*  CO2 24 24 25   GLUCOSE 95 88 104*  BUN 17 13 10   CREATININE 0.65 0.51* 0.45*  CALCIUM 7.4* 7.3* 7.2*  MG 1.3* 1.6* 1.5*  PHOS  --   --  1.9*   GFR Estimated Creatinine Clearance: 83.2 mL/min (A) (by C-G formula based on SCr of 0.45 mg/dL (L)). Liver Function Tests: Recent Labs  Lab 04/17/21 1325  AST 19  ALT 18  ALKPHOS 81  BILITOT 1.6*  PROT 5.0*  ALBUMIN 2.1*   No results for input(s): LIPASE, AMYLASE in the last 168 hours. No results for input(s): AMMONIA in the last 168 hours. Coagulation profile Recent Labs  Lab 04/17/21 1112 04/17/21 1325  INR 1.2 1.3*    CBC: Recent Labs  Lab 04/17/21 1112 04/17/21 1325 04/18/21 0322 04/19/21 0312  WBC 1.4* 1.4* 2.6* 4.6  NEUTROABS  --  1.0*  --   --   HGB 6.5* 6.2* 7.7* 8.0*  HCT 19.9* 18.7* 23.3* 24.5*  MCV 87.3 89.5 86.3 86.0  PLT 68* 60* 63* 67*   Cardiac Enzymes: No results for input(s): CKTOTAL, CKMB, CKMBINDEX, TROPONINI in the last 168 hours. BNP (last 3 results) No results for input(s): PROBNP in the last 8760 hours. CBG: No results for input(s): GLUCAP in the last 168 hours. D-Dimer: No results for input(s): DDIMER in the last 72 hours. Hgb A1c: No results for input(s): HGBA1C in the last 72 hours. Lipid Profile: No results for input(s): CHOL, HDL, LDLCALC, TRIG, CHOLHDL, LDLDIRECT in the last 72 hours. Thyroid function studies: No results for input(s): TSH, T4TOTAL, T3FREE, THYROIDAB in the last 72 hours.  Invalid input(s): FREET3 Anemia work up: Recent Labs    04/17/21 1325  VITAMINB12 859  FOLATE 6.3  FERRITIN 986*  TIBC 129*  IRON 45  RETICCTPCT 1.9   Sepsis Labs: Recent Labs  Lab 04/17/21 1112 04/17/21 1325 04/18/21 0322 04/19/21 0312  WBC 1.4* 1.4* 2.6* 4.6    Microbiology Recent Results (from the past 240 hour(s))  Resp Panel by RT-PCR (Flu A&B, Covid) Nasopharyngeal Swab     Status: None    Collection Time: 04/17/21  3:40 PM   Specimen: Nasopharyngeal Swab; Nasopharyngeal(NP) swabs in vial transport medium  Result Value Ref Range Status   SARS Coronavirus 2 by RT PCR NEGATIVE NEGATIVE Final    Comment: (NOTE) SARS-CoV-2 target nucleic acids are NOT DETECTED.  The SARS-CoV-2 RNA is generally detectable in upper respiratory specimens during the acute phase of infection. The lowest concentration of SARS-CoV-2 viral copies this assay can detect is 138 copies/mL. A negative result does not preclude SARS-Cov-2 infection and should not be used as the sole basis for treatment or other patient management decisions. A negative result may occur with  improper specimen collection/handling, submission of  specimen other than nasopharyngeal swab, presence of viral mutation(s) within the areas targeted by this assay, and inadequate number of viral copies(<138 copies/mL). A negative result must be combined with clinical observations, patient history, and epidemiological information. The expected result is Negative.  Fact Sheet for Patients:  BloggerCourse.com  Fact Sheet for Healthcare Providers:  SeriousBroker.it  This test is no t yet approved or cleared by the [place] FDA and  has been authorized for detection and/or diagnosis of SARS-CoV-2 by FDA under an Emergency Use Authorization (EUA). This EUA will remain  in effect (meaning this test can be used) for the duration of the COVID-19 declaration under Section 564(b)(1) of the Act, 21 U.S.C.section 360bbb-3(b)(1), unless the authorization is terminated  or revoked sooner.       Influenza A by PCR NEGATIVE NEGATIVE Final   Influenza B by PCR NEGATIVE NEGATIVE Final    Comment: (NOTE) The Xpert Xpress SARS-CoV-2/FLU/RSV plus assay is intended as an aid in the diagnosis of influenza from Nasopharyngeal swab specimens and should not be used as a sole basis for treatment.  Nasal washings and aspirates are unacceptable for Xpert Xpress SARS-CoV-2/FLU/RSV testing.  Fact Sheet for Patients: BloggerCourse.com  Fact Sheet for Healthcare Providers: SeriousBroker.it  This test is not yet approved or cleared by the [place] FDA and has been authorized for detection and/or diagnosis of SARS-CoV-2 by FDA under an Emergency Use Authorization (EUA). This EUA will remain in effect (meaning this test can be used) for the duration of the COVID-19 declaration under Section 564(b)(1) of the Act, 21 U.S.C. section 360bbb-3(b)(1), unless the authorization is terminated or revoked.  Performed at [facility], [address] [place], [place] [address]     Procedures and diagnostic studies:  No results found.  Medications:    collagenase   Topical BID   metoprolol tartrate  12.5 mg Oral BID   mirtazapine  7.5 mg Oral QHS   multivitamin with minerals  1 tablet Oral Daily   nutrition supplement (JUVEN)  1 packet Oral BID BM   Continuous Infusions:   LOS: 2 days   Joseph Art  [facility]   How to contact the [facility] Attending or Consulting provider 7A - 7P or covering provider during after hours 7P -7A, for this patient?  Check the care team in [facility] and look for a) attending/consulting TRH provider listed and b) the [facility] team listed Log into [email or web address] and use [place] universal password to access. If you do not have the password, please contact the hospital operator. Locate the [facility] provider you are looking for under [facility] and page to a number that you can be directly reached. If you still have difficulty reaching the provider, please page the [facility] (Director on Call) for the Hospitalists listed on amion for assistance.  04/19/2021, 2:06 PM

## 2021-04-19 NOTE — NC FL2 (Signed)
[place] MEDICAID FL2 LEVEL OF CARE SCREENING TOOL     IDENTIFICATION  Patient Name: Robert [name] Birthdate: 01-Feb-1941 Sex: male Admission Date (Current Location): 04/17/2021  [facility] and [place] Number:  Producer, television/film/video and Address:  [facility] [place], [address], [place], [place] [address]      Provider Number: [id number]  Attending Physician Name and Address:  Joseph Art, DO  Relative Name and Phone Number:  Dogan,[name] (Spouse)   [phone] [facility])    Current Level of Care: Hospital Recommended Level of Care: Skilled Nursing Facility Prior Approval Number:    Date Approved/Denied:   PASRR Number: Pending  Discharge Plan: SNF    Current Diagnoses: Patient Active Problem List   Diagnosis Date Noted   Atrial fibrillation with RVR (HCC) 04/17/2021   Electrolyte abnormality    Hypomagnesemia    Hypokalemia 03/18/2021   Weight loss 03/18/2021   Anemia due to antineoplastic chemotherapy 03/18/2021   Bone metastases (HCC) 02/19/2021   SCC (squamous cell carcinoma of lung) (HCC) 02/18/2021   Intractable pain 02/09/2021   Hypercalcemia 02/08/2021   HTN (hypertension)    Back pain    Normocytic anemia    Leukocytosis    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/2020   Permanent atrial fibrillation (HCC) 10/22/2018   Non-rheumatic mitral regurgitation 10/22/2018   Obesity (BMI 30-39.9) 10/22/2018    Orientation RESPIRATION BLADDER Height & Weight     Self, Time, Situation, Place  Normal Incontinent, External catheter Weight: 170 lb 6.7 oz (77.3 kg) Height:  6' (182.9 cm)  BEHAVIORAL SYMPTOMS/MOOD NEUROLOGICAL BOWEL NUTRITION STATUS      Incontinent Diet (See d/c summary)  AMBULATORY STATUS COMMUNICATION OF NEEDS Skin   Extensive Assist Verbally PU Stage and Appropriate Care (Pressure injury, sacrum, unstageable)                       Personal Care Assistance Level of Assistance  Bathing, Feeding, Dressing Bathing  Assistance: Maximum assistance Feeding assistance: Limited assistance Dressing Assistance: Maximum assistance     Functional Limitations Info  Sight, Hearing, Speech Sight Info: Adequate Hearing Info: Adequate Speech Info: Impaired    SPECIAL CARE FACTORS FREQUENCY  PT (By licensed PT), OT (By licensed OT)     PT Frequency: 5x/week OT Frequency: 5x/week            Contractures Contractures Info: Not present    Additional Factors Info  Code Status, Allergies Code Status Info: DNR Allergies Info: No known allergies           Current Medications (04/19/2021):  This is the current hospital active medication list Current Facility-Administered Medications  Medication Dose Route Frequency Provider Last Rate Last Admin   acetaminophen (TYLENOL) tablet 650 mg  650 mg Oral Q4H PRN Emeline General, MD       collagenase (SANTYL) ointment   Topical BID Joseph Art, DO   1 application at 04/19/21 0932   docusate sodium (COLACE) capsule 100 mg  100 mg Oral Daily PRN Emeline General, MD       metoprolol tartrate (LOPRESSOR) tablet 12.5 mg  12.5 mg Oral BID Vann, Jessica U, DO       mirtazapine (REMERON SOL-TAB) disintegrating tablet 7.5 mg  7.5 mg Oral QHS Sherlean Foot B, NP   7.5 mg at 04/18/21 2019   multivitamin with minerals tablet 1 tablet  1 tablet Oral Daily Marlin Canary U, DO   1 tablet  at 04/19/21 1040   nutrition supplement (JUVEN) (JUVEN) powder packet 1 packet  1 packet Oral BID BM Marlin Canary U, DO   1 packet at 04/19/21 1038   ondansetron (ZOFRAN) injection 4 mg  4 mg Intravenous Q6H PRN Mikey College T, MD       potassium & sodium phosphates (PHOS-NAK) 280-160-250 MG packet 1 packet  1 packet Oral TID WC & HS Vann, Jessica U, DO       prochlorperazine (COMPAZINE) tablet 10 mg  10 mg Oral Q6H PRN Emeline General, MD       Facility-Administered Medications Ordered in Other Encounters  Medication Dose Route Frequency Provider Last Rate Last Admin   potassium chloride  10 MEQ/100ML IVPB              Discharge Medications: Please see discharge summary for a list of discharge medications.  Relevant Imaging Results:  Relevant Lab Results:   Additional Information SSN [id number] Pt ix covid vaccinated x5  Clayson Riling P Rohnan Bartleson, LCSW

## 2021-04-19 NOTE — TOC Initial Note (Signed)
Transition of Care [facility]) - Initial/Assessment Note    Patient Details  Name: Robert [name] MRN: [id number] Date of Birth: 25-Sep-1940  Transition of [facility]) CM/SW Contact:    Bethann Berkshire, [place] Phone Number: 04/19/2021, 2:40 PM  Clinical Narrative:                  CSW met with pt and pt wife to discuss SNF recomendation. Explained SNF process and insurance coverage. Pt was living at home with wife. They are a little ambivalent about SNF but agreeable to starting SNF workup. Pt reports he is covid vaccinated x5. CSW completed fl2 and faxed Bed requests in HUB. PASSR pending.   Expected Discharge Plan: Skilled Nursing Facility Barriers to Discharge: Continued Medical Work up   Patient Goals and CMS Choice        Expected Discharge Plan and Services Expected Discharge Plan: [place] arrangements for the past 2 months: Single Family Home                                      Prior Living Arrangements/Services Living arrangements for the past 2 months: Single Family Home Lives with:: Spouse Patient language and need for interpreter reviewed:: Yes        Need for Family Participation in Patient Care: Yes (Comment) Care giver support system in place?: No (comment)   Criminal Activity/Legal Involvement Pertinent to Current Situation/Hospitalization: No - Comment as needed  Activities of Daily Living      Permission Sought/Granted   Permission granted to share information with : Yes, Verbal Permission Granted  Share Information with NAME: Hobdy,[name] (Spouse)   [phone] (Mobile)           Emotional Assessment Appearance:: Appears stated age Attitude/Demeanor/Rapport: Lethargic Affect (typically observed): Accepting Orientation: : Oriented to Self, Oriented to Place, Oriented to  Time, Oriented to Situation Alcohol / Substance Use: Not Applicable Psych Involvement: No (comment)  Admission diagnosis:  Hypokalemia  [E87.6] Hypochloremia [E87.8] Hypomagnesemia [E83.42] A-fib (HCC) [I48.91] Permanent atrial fibrillation (HCC) [I48.21] Electrolyte abnormality [E87.8] Atrial fibrillation with RVR (HCC) [I48.91] Patient Active Problem List   Diagnosis Date Noted   Atrial fibrillation with RVR ([place]) 04/17/2021   Electrolyte abnormality    Hypomagnesemia    Hypokalemia 03/18/2021   Weight loss 03/18/2021   Anemia due to antineoplastic chemotherapy 03/18/2021   Bone metastases ([place]) 02/19/2021   SCC (squamous cell carcinoma of lung) ([place]) 02/18/2021   Intractable pain 02/09/2021   Hypercalcemia 02/08/2021   HTN (hypertension)    Back pain    Normocytic anemia    Leukocytosis    Cancer of lower gum ([place]) 10/05/2020   Permanent atrial fibrillation ([place]) 10/22/2018   Non-rheumatic mitral regurgitation 10/22/2018   Obesity (BMI 30-39.9) 10/22/2018   PCP:  London Pepper, MD Pharmacy:   [id number] [id number]: [phone]: [phone]    Social Determinants of Health (SDOH) Interventions    Readmission Risk Interventions No flowsheet data found.

## 2021-04-19 NOTE — Progress Notes (Signed)
Daily Progress Note   Patient Name: Robert [name]       Date: 04/19/2021 DOB: Jun 10, 1940  Age: 80 y.o. MRN#: [id number] Attending Physician: Joseph Art, DO Primary Care Physician: Farris Has, MD Admit Date: 04/17/2021   HPI/Patient Profile: 80 y.o. 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    Head and neck squamous cell carcinoma initially diagnosed in 2019, status post resection. Recurrence in May 2022. He underwent radiation therapy June-August 2022. In October 2022, was found to have metastatic lesions in lung and mediastinal lymph nodes as well as multiple suspicious lever lesions and osseous lesions. He started chemotherapy and immunotherapy in November, with most recent treatment 04/08/21.    Patient has recently had difficulty swallowing solid food and he has been losing weight. Oncology had sent him to IR for PEG placement. This was initially attempted 04/10/21, but patient was found to be in a-fib with RVR so procedure was aborted. It was re-attempted 12/28, but patient was again found to be in a-fib with RVR.   Subjective: Note that patient was evaluated by OT this morning and has been recommended for SNF/rehab. General transfer was deferred due to elevated heart rate.   Robert [name] is resting comfortably in bed. He has no acute complaints. He reports sleeping well through the night, and expresses appreciation for the medication started yesterday to help with sleep.   Wife and daughter are at bedside. Reviewed plan of care which is to hold off on PEG tube until HR has  stabilized, then reconsider. Also discussed advancing diet from full liquid as patient was able to eat solid food at home as long as it was finely chopped.    Addendum 04/20/21: Discussed advancing diet with Dr. Benjamine Mola who agrees it can be advanced to dysphagia 3 for now and adjust as needed.    Length of Stay: 2  Current Medications: Scheduled Meds:   collagenase   Topical BID   metoprolol tartrate  12.5 mg Oral BID   mirtazapine  7.5 mg Oral QHS   multivitamin with minerals  1 tablet Oral Daily   nutrition supplement (JUVEN)  1 packet Oral BID BM  potassium & sodium phosphates  1 packet Oral TID WC & HS      PRN Meds: acetaminophen, docusate sodium, ondansetron (ZOFRAN) IV, prochlorperazine  Physical Exam Vitals reviewed.  Constitutional:      Appearance: He is ill-appearing.  Cardiovascular:     Rate and Rhythm: Tachycardia present. Rhythm irregularly irregular.  Pulmonary:     Effort: Pulmonary effort is normal.  Neurological:     Mental Status: He is alert and oriented to person, place, and time.     Motor: Weakness present.            Vital Signs: BP (!) 96/52    Pulse (!) 128    Temp 97.8 F (36.6 C) (Oral)    Resp 16    Ht 6' (1.829 m)    Wt 77.3 kg    SpO2 98%    BMI 23.11 kg/m  SpO2: SpO2: 98 % O2 Device: O2 Device: Room Air O2 Flow Rate:    Intake/output summary:  Intake/Output Summary (Last 24 hours) at 04/19/2021 1643 Last data filed at 04/19/2021 1613 Gross per 24 hour  Intake 937 ml  Output 275 ml  Net 662 ml   LBM: Last BM Date: 04/18/21 Baseline Weight: Weight: 83.9 kg Most recent weight: Weight: 77.3 kg       Palliative Assessment/Data: PPS 30-40%      Palliative Care Assessment & Plan   Assessment: - Afib with RVR - Severe hypokalemia - severe hypomagnesemia - hyponatremia - new onset pancytopenia - chronic diastolic CHF - Head and neck metastatic squamous cell carcinoma - failure to thrive  Recommendations/Plan: Continue  current supportive care for now Plan to hold off on PEG placement for now Patient/wife are hopeful his cardiac condition will stabilize Continue mirtazapine 7.5 mg at bedtime for sleep Advance diet to dysphagia 3 PMT will continue to follow  Code Status: DNR/DNI  Prognosis:  Unable to determine  Discharge Planning: To Be Determined    Thank you for allowing the Palliative Medicine Team to assist in the care of this patient.   Total Time 25 minutes Prolonged Time Billed  no       Greater than 50%  of this time was spent counseling and coordinating care related to the above assessment and plan.  Merry Proud, NP  Please contact Palliative Medicine Team phone at [phone] for questions and concerns.

## 2021-04-20 LAB — MAGNESIUM: Magnesium: 1.7 mg/dL (ref 1.7–2.4)

## 2021-04-20 LAB — PHOSPHORUS: Phosphorus: 2.7 mg/dL (ref 2.5–4.6)

## 2021-04-20 LAB — BASIC METABOLIC PANEL
Anion gap: 7 (ref 5–15)
BUN: 10 mg/dL (ref 8–23)
CO2: 25 mmol/L (ref 22–32)
Calcium: 7.3 mg/dL — ABNORMAL LOW (ref 8.9–10.3)
Chloride: 92 mmol/L — ABNORMAL LOW (ref 98–111)
Creatinine, Ser: 0.48 mg/dL — ABNORMAL LOW (ref 0.61–1.24)
GFR, Estimated: >60 mL/min (ref >60)
Glucose, Bld: 113 mg/dL — ABNORMAL HIGH (ref 70–99)
Potassium: 3.7 mmol/L (ref 3.5–5.1)
Sodium: 124 mmol/L — ABNORMAL LOW (ref 135–145)

## 2021-04-20 LAB — CBC
HCT: 22.6 % — ABNORMAL LOW (ref 39.0–52.0)
Hemoglobin: 7.4 g/dL — ABNORMAL LOW (ref 13.0–17.0)
MCH: 28 pg (ref 26.0–34.0)
MCHC: 32.7 g/dL (ref 30.0–36.0)
MCV: 85.6 fL (ref 80.0–100.0)
Platelets: 65 10*3/uL — ABNORMAL LOW (ref 150–400)
RBC: 2.64 MIL/uL — ABNORMAL LOW (ref 4.22–5.81)
RDW: 19.7 % — ABNORMAL HIGH (ref 11.5–15.5)
WBC: 5.3 10*3/uL (ref 4.0–10.5)
nRBC: 0 % (ref 0.0–0.2)

## 2021-04-20 LAB — SODIUM, URINE, RANDOM: Sodium, Ur: 67 mmol/L

## 2021-04-20 LAB — OSMOLALITY, URINE: Osmolality, Ur: 401 mOsm/kg (ref 300–900)

## 2021-04-20 MED ORDER — ENSURE ENLIVE PO LIQD
237.0000 mL | Freq: Two times a day (BID) | ORAL | Status: DC
  Administered 2021-04-20 – 2021-04-21 (×4): 237 mL via ORAL
  Filled 2021-04-20: qty 237

## 2021-04-20 MED ORDER — MAGNESIUM SULFATE 2 GM/50ML IV SOLN
2.0000 g | Freq: Once | INTRAVENOUS | Status: AC
Start: 2021-04-20 — End: 2021-04-20
  Administered 2021-04-20: 2 g via INTRAVENOUS
  Filled 2021-04-20: qty 50

## 2021-04-20 NOTE — Progress Notes (Signed)
Progress Note    Robert [name]  ZOX:[id number] DOB: 1940/05/21  DOA: 04/17/2021 PCP: Robert Has, MD    Brief Narrative:    Medical records reviewed and are as summarized below:  Robert [name] is an 80 y.o. 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 fib on Xarelto, chronic diastolic CHF, severe mitral regurgitation, presented with rapid A. Fib and low hgb.  Patient's HR [name] been difficult to control.  Slow to improve. Many medical issues.  He and family are re-considering if he wants the G tube or not- will re-assess on Monday.   Assessment/Plan:   Principal Problem:   Atrial fibrillation with RVR (HCC) Active Problems:   Permanent atrial fibrillation (HCC)   A. fib with RVR -digoxin given -cards consult appreciated  -low dose BB-titrate up as able -Continue to hold anticoagulation due to severe anemia and severe thrombocytopenia.   Severe hypokalemia -replete to 4   Severe hypomagnesemia -replete to 2   Hyponatremia -await urine studies -asked nursing to collect   New onset of pancytopenia -Likely related to recent chemotherapy and immunotherapy. -oncology consult -s/p 2 units PRBC -trend   Chronic diastolic CHF -holding lasix due to hypotension -high risk of 3rd spacing with poor PO nutrition   Head and neck metastatic squamous cell carcinoma -poor overall prognosis -defer to oncology consult   Failure to thrive -unable to get PEG Tube- family and wife are re-considering if he wants that -consult dietitian.   Pressure Injury 04/18/21 Sacrum Unstageable - Full thickness tissue loss in which the base of the injury is covered by slough (yellow, tan, gray, green or brown) and/or eschar (tan, brown or black) in the wound bed. yellow slough in wound bed---see [facility] (Active)  04/18/21 1550 (see WOCRN progress note)  Location: Sacrum  Location  Orientation:   Staging: Unstageable - Full thickness tissue loss in which the base of the injury is covered by slough (yellow, tan, gray, green or brown) and/or eschar (tan, brown or black) in the wound bed. (see WOCRN progress note)  Wound Description (Comments): yellow slough in wound bed---see [facility] progress note for details and measurements  Present on Admission: Yes  Sacral decubitus -WOC consult -un-stage-able   -POA  Difficult situation.  Will continue with medical care sans CPR/intubation and if he does not continue to improve, can re-assess with palliative care    Family Communication/Anticipated D/C date and plan/Code Status   DVT prophylaxis: scd Code Status: DNR Family Communication: son at bedside Disposition Plan: Status is: Inpatient  Remains inpatient appropriate because: needs nutrition status addressed along with a fib         Medical Consultants:   IR Oncology Cards Palliative care     Subjective:   Asking for an advancement of diet -- eats some soft things at home The 500 calorie shake was "too thick" per patient  Objective:    Vitals:   04/20/21 0900 04/20/21 0935 04/20/21 1000 04/20/21 1138  BP: 103/64 (!) 96/59 104/61 107/62  Pulse: (!) 116 (!) 117 (!) 136 (!) 109  Resp: 20 19 (!) 26 18  Temp:      TempSrc:      SpO2: 99%  99%   Weight:      Height:        Intake/Output Summary (Last 24 hours) at 04/20/2021 1317 Last data filed at 04/20/2021 1100 Gross per 24 hour  Intake  1473 ml  Output 1150 ml  Net 323 ml   Filed Weights   04/18/21 0309 04/19/21 0010 04/20/21 0400  Weight: 83.9 kg 77.3 kg 79 kg    Exam:  General: Appearance:    Chronically ill appearing male in no acute distress     Lungs:    respirations unlabored, poor effort  Heart:    Tachycardic. irregular  MS:   All extremities are intact.    Neurologic:   Awake, alert, oriented x 3. Speech difficult to understand at times       Data Reviewed:   I have  personally reviewed following labs and imaging studies:  Labs: Labs show the following:   Basic Metabolic Panel: Recent Labs  Lab 04/17/21 1325 04/18/21 0322 04/19/21 0312 04/20/21 0420  NA 126* 128* 124* 124*  K 2.8* 3.2* 3.6 3.7  CL 91* 94* 93* 92*  CO2 24 24 25 25   GLUCOSE 95 88 104* 113*  BUN 17 13 10 10   CREATININE 0.65 0.51* 0.45* 0.48*  CALCIUM 7.4* 7.3* 7.2* 7.3*  MG 1.3* 1.6* 1.5* 1.7  PHOS  --   --  1.9* 2.7   GFR Estimated Creatinine Clearance: 83.5 mL/min (A) (by C-G formula based on SCr of 0.48 mg/dL (L)). Liver Function Tests: Recent Labs  Lab 04/17/21 1325  AST 19  ALT 18  ALKPHOS 81  BILITOT 1.6*  PROT 5.0*  ALBUMIN 2.1*   No results for input(s): LIPASE, AMYLASE in the last 168 hours. No results for input(s): AMMONIA in the last 168 hours. Coagulation profile Recent Labs  Lab 04/17/21 1112 04/17/21 1325  INR 1.2 1.3*    CBC: Recent Labs  Lab 04/17/21 1112 04/17/21 1325 04/18/21 0322 04/19/21 0312 04/20/21 0420  WBC 1.4* 1.4* 2.6* 4.6 5.3  NEUTROABS  --  1.0*  --   --   --   HGB 6.5* 6.2* 7.7* 8.0* 7.4*  HCT 19.9* 18.7* 23.3* 24.5* 22.6*  MCV 87.3 89.5 86.3 86.0 85.6  PLT 68* 60* 63* 67* 65*   Cardiac Enzymes: No results for input(s): CKTOTAL, CKMB, CKMBINDEX, TROPONINI in the last 168 hours. BNP (last 3 results) No results for input(s): PROBNP in the last 8760 hours. CBG: No results for input(s): GLUCAP in the last 168 hours. D-Dimer: No results for input(s): DDIMER in the last 72 hours. Hgb A1c: No results for input(s): HGBA1C in the last 72 hours. Lipid Profile: No results for input(s): CHOL, HDL, LDLCALC, TRIG, CHOLHDL, LDLDIRECT in the last 72 hours. Thyroid function studies: No results for input(s): TSH, T4TOTAL, T3FREE, THYROIDAB in the last 72 hours.  Invalid input(s): FREET3 Anemia work up: Recent Labs    04/17/21 1325  VITAMINB12 859  FOLATE 6.3  FERRITIN 986*  TIBC 129*  IRON 45  RETICCTPCT 1.9   Sepsis  Labs: Recent Labs  Lab 04/17/21 1325 04/18/21 0322 04/19/21 0312 04/20/21 0420  WBC 1.4* 2.6* 4.6 5.3    Microbiology Recent Results (from the past 240 hour(s))  Resp Panel by RT-PCR (Flu A&B, Covid) Nasopharyngeal Swab     Status: None   Collection Time: 04/17/21  3:40 PM   Specimen: Nasopharyngeal Swab; Nasopharyngeal(NP) swabs in vial transport medium  Result Value Ref Range Status   SARS Coronavirus 2 by RT PCR NEGATIVE NEGATIVE Final    Comment: (NOTE) SARS-CoV-2 target nucleic acids are NOT DETECTED.  The SARS-CoV-2 RNA is generally detectable in upper respiratory specimens during the acute phase of infection. The lowest concentration of SARS-CoV-2  viral copies this assay can detect is 138 copies/mL. A negative result does not preclude SARS-Cov-2 infection and should not be used as the sole basis for treatment or other patient management decisions. A negative result may occur with  improper specimen collection/handling, submission of specimen other than nasopharyngeal swab, presence of viral mutation(s) within the areas targeted by this assay, and inadequate number of viral copies(<138 copies/mL). A negative result must be combined with clinical observations, patient history, and epidemiological information. The expected result is Negative.  Fact Sheet for Patients:  BloggerCourse.com  Fact Sheet for Healthcare Providers:  SeriousBroker.it  This test is no t yet approved or cleared by the [place] FDA and  [name] been authorized for detection and/or diagnosis of SARS-CoV-2 by FDA under an Emergency Use Authorization (EUA). This EUA will remain  in effect (meaning this test can be used) for the duration of the COVID-19 declaration under Section 564(b)(1) of the Act, 21 U.S.C.section 360bbb-3(b)(1), unless the authorization is terminated  or revoked sooner.       Influenza A by PCR NEGATIVE NEGATIVE Final    Influenza B by PCR NEGATIVE NEGATIVE Final    Comment: (NOTE) The Xpert Xpress SARS-CoV-2/FLU/RSV plus assay is intended as an aid in the diagnosis of influenza from Nasopharyngeal swab specimens and should not be used as a sole basis for treatment. Nasal washings and aspirates are unacceptable for Xpert Xpress SARS-CoV-2/FLU/RSV testing.  Fact Sheet for Patients: BloggerCourse.com  Fact Sheet for Healthcare Providers: SeriousBroker.it  This test is not yet approved or cleared by the [place] FDA and [name] been authorized for detection and/or diagnosis of SARS-CoV-2 by FDA under an Emergency Use Authorization (EUA). This EUA will remain in effect (meaning this test can be used) for the duration of the COVID-19 declaration under Section 564(b)(1) of the Act, 21 U.S.C. section 360bbb-3(b)(1), unless the authorization is terminated or revoked.  Performed at [facility], [address] [place], [place] [address]     Procedures and diagnostic studies:  No results found.  Medications:    collagenase   Topical BID   feeding supplement  237 mL Oral BID BM   metoprolol tartrate  12.5 mg Oral BID   mirtazapine  7.5 mg Oral QHS   multivitamin with minerals  1 tablet Oral Daily   nutrition supplement (JUVEN)  1 packet Oral BID BM   potassium & sodium phosphates  1 packet Oral TID WC & HS   Continuous Infusions:   LOS: 3 days   Joseph Art  [facility]   How to contact the [facility] Attending or Consulting provider 7A - 7P or covering provider during after hours 7P -7A, for this patient?  Check the care team in [facility] and look for a) attending/consulting TRH provider listed and b) the [facility] team listed Log into [email or web address] and use [place]'s universal password to access. If you do not have the password, please contact the hospital operator. Locate the [facility] provider you are looking for under [facility] and  page to a number that you can be directly reached. If you still have difficulty reaching the provider, please page the [facility] (Director on Call) for the Hospitalists listed on amion for assistance.  04/20/2021, 1:17 PM

## 2021-04-20 NOTE — Progress Notes (Signed)
°   04/19/21 2000  Assess: MEWS Score  Temp 97.9 F (36.6 C)  BP 98/67  Pulse Rate (!) 121  ECG Heart Rate (!) 121  Resp 18  SpO2 96 %  Assess: MEWS Score  MEWS Temp 0  MEWS Systolic 1  MEWS Pulse 2  MEWS RR 0  MEWS LOC 0  MEWS Score 3  MEWS Score Color Yellow  Assess: if the MEWS score is Yellow or Red  Were vital signs taken at a resting state? Yes  Focused Assessment No change from prior assessment  Early Detection of Sepsis Score *See Row Information* Medium  MEWS guidelines implemented *See Row Information* No, previously yellow, continue vital signs every 4 hours  Treat  MEWS Interventions Administered scheduled meds/treatments  Pain Scale 0-10  Pain Score 0  Notify: Charge Nurse/RN  Name of Charge Nurse/RN Notified Karen, RN  Date Charge Nurse/RN Notified 04/19/21  Time Charge Nurse/RN Notified 2005

## 2021-04-21 LAB — BASIC METABOLIC PANEL
Anion gap: 9 (ref 5–15)
Anion gap: 9 (ref 5–15)
BUN: 14 mg/dL (ref 8–23)
BUN: 13 mg/dL (ref 8–23)
CO2: 25 mmol/L (ref 22–32)
CO2: 26 mmol/L (ref 22–32)
Calcium: 7.4 mg/dL — ABNORMAL LOW (ref 8.9–10.3)
Calcium: 7.9 mg/dL — ABNORMAL LOW (ref 8.9–10.3)
Chloride: 87 mmol/L — ABNORMAL LOW (ref 98–111)
Chloride: 88 mmol/L — ABNORMAL LOW (ref 98–111)
Creatinine, Ser: 0.42 mg/dL — ABNORMAL LOW (ref 0.61–1.24)
Creatinine, Ser: 0.48 mg/dL — ABNORMAL LOW (ref 0.61–1.24)
GFR, Estimated: >60 mL/min (ref >60)
GFR, Estimated: >60 mL/min (ref >60)
Glucose, Bld: 109 mg/dL — ABNORMAL HIGH (ref 70–99)
Glucose, Bld: 122 mg/dL — ABNORMAL HIGH (ref 70–99)
Potassium: 3.8 mmol/L (ref 3.5–5.1)
Potassium: 3.8 mmol/L (ref 3.5–5.1)
Sodium: 121 mmol/L — ABNORMAL LOW (ref 135–145)
Sodium: 123 mmol/L — ABNORMAL LOW (ref 135–145)

## 2021-04-21 LAB — CBC
HCT: 23.8 % — ABNORMAL LOW (ref 39.0–52.0)
Hemoglobin: 7.9 g/dL — ABNORMAL LOW (ref 13.0–17.0)
MCH: 28.6 pg (ref 26.0–34.0)
MCHC: 33.2 g/dL (ref 30.0–36.0)
MCV: 86.2 fL (ref 80.0–100.0)
Platelets: 68 10*3/uL — ABNORMAL LOW (ref 150–400)
RBC: 2.76 MIL/uL — ABNORMAL LOW (ref 4.22–5.81)
RDW: 19.6 % — ABNORMAL HIGH (ref 11.5–15.5)
WBC: 5.9 10*3/uL (ref 4.0–10.5)
nRBC: 0 % (ref 0.0–0.2)

## 2021-04-21 LAB — OSMOLALITY: Osmolality: 257 mOsm/kg — ABNORMAL LOW (ref 275–295)

## 2021-04-21 MED ORDER — SODIUM CHLORIDE 0.9 % IV SOLN: INTRAVENOUS | Status: DC

## 2021-04-21 MED ORDER — METOPROLOL TARTRATE 25 MG PO TABS
25.0000 mg | ORAL_TABLET | Freq: Two times a day (BID) | ORAL | Status: DC
  Administered 2021-04-21 – 2021-04-26 (×11): 25 mg via ORAL
  Filled 2021-04-21 (×11): qty 1

## 2021-04-21 MED ORDER — LOPERAMIDE HCL 2 MG PO CAPS
2.0000 mg | ORAL_CAPSULE | ORAL | Status: DC | PRN
  Administered 2021-04-21 – 2021-04-22 (×3): 2 mg via ORAL
  Filled 2021-04-21 (×3): qty 1

## 2021-04-21 MED ORDER — DIGOXIN 125 MCG PO TABS
0.0625 mg | ORAL_TABLET | Freq: Every day | ORAL | Status: DC
  Administered 2021-04-21 – 2021-04-23 (×3): 0.0625 mg via ORAL
  Filled 2021-04-21 (×3): qty 1

## 2021-04-21 NOTE — Progress Notes (Signed)
°   04/20/21 2044  Assess: MEWS Score  Temp 98.3 F (36.8 C)  BP 109/82  Pulse Rate (!) 126  Resp 20  SpO2 98 %  O2 Device Room Air  Assess: MEWS Score  MEWS Temp 0  MEWS Systolic 0  MEWS Pulse 2  MEWS RR 0  MEWS LOC 0  MEWS Score 2  MEWS Score Color Yellow  Assess: if the MEWS score is Yellow or Red  Were vital signs taken at a resting state? Yes  Focused Assessment No change from prior assessment  Early Detection of Sepsis Score *See Row Information* Low  MEWS guidelines implemented *See Row Information* No, other (Comment) (HR has been in the 120-130's MD is aware)  Document  Patient Outcome Stabilized after interventions  Progress note created (see row info) Yes

## 2021-04-21 NOTE — Plan of Care (Signed)
Problem: Clinical Measurements: °Goal: Will remain free from infection °Outcome: Completed/Met °  °

## 2021-04-21 NOTE — Plan of Care (Signed)
Problem: Pain Managment: Goal: General experience of comfort will improve Outcome: Completed/Met   Problem: Coping: Goal: Level of anxiety will decrease Outcome: Completed/Met

## 2021-04-21 NOTE — Progress Notes (Signed)
°   04/20/21 2044 04/21/21 0011  Assess: MEWS Score  Resp 20 20  SpO2  --  97 %  O2 Device  --  Room Air  Assess: MEWS Score  MEWS Temp 0 0  MEWS Systolic 0 0  MEWS Pulse 2 2  MEWS RR 0 0  MEWS LOC 0 0  MEWS Score 2 2  MEWS Score Color Yellow Yellow  Assess: if the MEWS score is Yellow or Red  Were vital signs taken at a resting state?  --  Yes  Focused Assessment  --  No change from prior assessment  Early Detection of Sepsis Score *See Row Information*  --  Low  MEWS guidelines implemented *See Row Information*  --  No, previously yellow, continue vital signs every 4 hours  Document  Patient Outcome  --  Stabilized after interventions  Progress note created (see row info)  --  Yes

## 2021-04-21 NOTE — Progress Notes (Signed)
Progress Note    Robert [name]  QIO:[id number] DOB: Nov 19, 1940  DOA: 04/17/2021 PCP: Farris Has, MD    Brief Narrative:    Medical records reviewed and are as summarized below:  Robert [name] is an 81 y.o. 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 fib on Xarelto, chronic diastolic CHF, severe mitral regurgitation, presented with rapid A. Fib and low hgb.  Patient's HR has been difficult to control.  Slow to improve. Many medical issues.  He and family are re-considering if he wants the G tube or not- will re-assess on Monday.   Assessment/Plan:   Principal Problem:   Atrial fibrillation with RVR (HCC) Active Problems:   Permanent atrial fibrillation (HCC)   A. fib with RVR -digoxin given -cards consult appreciated  -titrate up BB -add low dose dig -Continue to hold anticoagulation due to severe anemia and severe thrombocytopenia.   Severe hypokalemia -replete to 4   Severe hypomagnesemia -replete to 2   Hyponatremia -appears to be hypovolemia as urine Na >40 -add gentle IVF   New onset of pancytopenia -Likely related to recent chemotherapy and immunotherapy. -oncology consult -s/p 2 units PRBC -trend- suspect he is volume depleted and will need another blood transfusion in the AM   Chronic diastolic CHF -holding lasix due to hypotension -high risk of 3rd spacing with poor PO nutrition   Head and neck metastatic squamous cell carcinoma -poor overall prognosis -defer to oncology consult   Failure to thrive -unable to get PEG Tube- family and wife are re-considering if he wants that -consult dietitian. -calorie count   Pressure Injury 04/18/21 Sacrum Unstageable - Full thickness tissue loss in which the base of the injury is covered by slough (yellow, tan, gray, green or brown) and/or eschar (tan, brown or black) in the wound bed. yellow slough in  wound bed---see [facility] (Active)  04/18/21 1550 (see WOCRN progress note)  Location: Sacrum  Location Orientation:   Staging: Unstageable - Full thickness tissue loss in which the base of the injury is covered by slough (yellow, tan, gray, green or brown) and/or eschar (tan, brown or black) in the wound bed. (see WOCRN progress note)  Wound Description (Comments): yellow slough in wound bed---see [facility] progress note for details and measurements  Present on Admission: Yes  Sacral decubitus -WOC consult -un-stage-able   -POA  Difficult situation.  Will continue with medical care sans CPR/intubation and if he does not continue to improve, can re-assess with palliative care    Family Communication/Anticipated D/C date and plan/Code Status   DVT prophylaxis: scd Code Status: DNR Family Communication: son at bedside 12/31 Disposition Plan: Status is: Inpatient  Remains inpatient appropriate because: needs nutrition status addressed along with a fib         Medical Consultants:   IR Oncology Cards Palliative care     Subjective:   Does not think he has gotten "solid" food yet  Objective:    Vitals:   04/21/21 0900 04/21/21 1000 04/21/21 1100 04/21/21 1242  BP: 97/61 97/60 100/60   Pulse: (!) 110 (!) 104 (!) 102 (!) 102  Resp: 18 18 20    Temp: 98.6 F (37 C)  98.7 F (37.1 C)   TempSrc: Oral  Oral   SpO2: 98% 100% 100%   Weight:      Height:        Intake/Output Summary (Last 24 hours) at 04/21/2021  1408 Last data filed at 04/21/2021 1051 Gross per 24 hour  Intake 990 ml  Output 1675 ml  Net -685 ml   Filed Weights   04/19/21 0010 04/20/21 0400 04/21/21 0418  Weight: 77.3 kg 79 kg 77.1 kg    Exam:  General: Appearance:    Chronically ill appearing male who appears uncomfortable-- getting cleaned up currently     Lungs:    diminished  Heart:    Tachycardic. irregular     Neurologic:   Awake, alert. Speech difficult to understand at times        Data Reviewed:   I have personally reviewed following labs and imaging studies:  Labs: Labs show the following:   Basic Metabolic Panel: Recent Labs  Lab 04/17/21 1325 04/18/21 0322 04/19/21 0312 04/20/21 0420 04/21/21 0338  NA 126* 128* 124* 124* 121*  K 2.8* 3.2* 3.6 3.7 3.8  CL 91* 94* 93* 92* 87*  CO2 24 24 25 25 25   GLUCOSE 95 88 104* 113* 109*  BUN 17 13 10 10 14   CREATININE 0.65 0.51* 0.45* 0.48* 0.42*  CALCIUM 7.4* 7.3* 7.2* 7.3* 7.4*  MG 1.3* 1.6* 1.5* 1.7  --   PHOS  --   --  1.9* 2.7  --    GFR Estimated Creatinine Clearance: 83 mL/min (A) (by C-G formula based on SCr of 0.42 mg/dL (L)). Liver Function Tests: Recent Labs  Lab 04/17/21 1325  AST 19  ALT 18  ALKPHOS 81  BILITOT 1.6*  PROT 5.0*  ALBUMIN 2.1*   No results for input(s): LIPASE, AMYLASE in the last 168 hours. No results for input(s): AMMONIA in the last 168 hours. Coagulation profile Recent Labs  Lab 04/17/21 1112 04/17/21 1325  INR 1.2 1.3*    CBC: Recent Labs  Lab 04/17/21 1325 04/18/21 0322 04/19/21 0312 04/20/21 0420 04/21/21 0338  WBC 1.4* 2.6* 4.6 5.3 5.9  NEUTROABS 1.0*  --   --   --   --   HGB 6.2* 7.7* 8.0* 7.4* 7.9*  HCT 18.7* 23.3* 24.5* 22.6* 23.8*  MCV 89.5 86.3 86.0 85.6 86.2  PLT 60* 63* 67* 65* 68*   Cardiac Enzymes: No results for input(s): CKTOTAL, CKMB, CKMBINDEX, TROPONINI in the last 168 hours. BNP (last 3 results) No results for input(s): PROBNP in the last 8760 hours. CBG: No results for input(s): GLUCAP in the last 168 hours. D-Dimer: No results for input(s): DDIMER in the last 72 hours. Hgb A1c: No results for input(s): HGBA1C in the last 72 hours. Lipid Profile: No results for input(s): CHOL, HDL, LDLCALC, TRIG, CHOLHDL, LDLDIRECT in the last 72 hours. Thyroid function studies: No results for input(s): TSH, T4TOTAL, T3FREE, THYROIDAB in the last 72 hours.  Invalid input(s): FREET3 Anemia work up: No results for input(s): VITAMINB12,  FOLATE, FERRITIN, TIBC, IRON, RETICCTPCT in the last 72 hours.  Sepsis Labs: Recent Labs  Lab 04/18/21 0322 04/19/21 0312 04/20/21 0420 04/21/21 0338  WBC 2.6* 4.6 5.3 5.9    Microbiology Recent Results (from the past 240 hour(s))  Resp Panel by RT-PCR (Flu A&B, Covid) Nasopharyngeal Swab     Status: None   Collection Time: 04/17/21  3:40 PM   Specimen: Nasopharyngeal Swab; Nasopharyngeal(NP) swabs in vial transport medium  Result Value Ref Range Status   SARS Coronavirus 2 by RT PCR NEGATIVE NEGATIVE Final    Comment: (NOTE) SARS-CoV-2 target nucleic acids are NOT DETECTED.  The SARS-CoV-2 RNA is generally detectable in upper respiratory specimens during the acute  phase of infection. The lowest concentration of SARS-CoV-2 viral copies this assay can detect is 138 copies/mL. A negative result does not preclude SARS-Cov-2 infection and should not be used as the sole basis for treatment or other patient management decisions. A negative result may occur with  improper specimen collection/handling, submission of specimen other than nasopharyngeal swab, presence of viral mutation(s) within the areas targeted by this assay, and inadequate number of viral copies(<138 copies/mL). A negative result must be combined with clinical observations, patient history, and epidemiological information. The expected result is Negative.  Fact Sheet for Patients:  BloggerCourse.com  Fact Sheet for Healthcare Providers:  SeriousBroker.it  This test is no t yet approved or cleared by the [place] FDA and  has been authorized for detection and/or diagnosis of SARS-CoV-2 by FDA under an Emergency Use Authorization (EUA). This EUA will remain  in effect (meaning this test can be used) for the duration of the COVID-19 declaration under Section 564(b)(1) of the Act, 21 U.S.C.section 360bbb-3(b)(1), unless the authorization is terminated  or  revoked sooner.       Influenza A by PCR NEGATIVE NEGATIVE Final   Influenza B by PCR NEGATIVE NEGATIVE Final    Comment: (NOTE) The Xpert Xpress SARS-CoV-2/FLU/RSV plus assay is intended as an aid in the diagnosis of influenza from Nasopharyngeal swab specimens and should not be used as a sole basis for treatment. Nasal washings and aspirates are unacceptable for Xpert Xpress SARS-CoV-2/FLU/RSV testing.  Fact Sheet for Patients: BloggerCourse.com  Fact Sheet for Healthcare Providers: SeriousBroker.it  This test is not yet approved or cleared by the [place] FDA and has been authorized for detection and/or diagnosis of SARS-CoV-2 by FDA under an Emergency Use Authorization (EUA). This EUA will remain in effect (meaning this test can be used) for the duration of the COVID-19 declaration under Section 564(b)(1) of the Act, 21 U.S.C. section 360bbb-3(b)(1), unless the authorization is terminated or revoked.  Performed at [facility], [address], [place], [place] [address]     Procedures and diagnostic studies:  No results found.  Medications:    collagenase   Topical BID   digoxin  0.0625 mg Oral Daily   feeding supplement  237 mL Oral BID BM   metoprolol tartrate  25 mg Oral BID   mirtazapine  7.5 mg Oral QHS   multivitamin with minerals  1 tablet Oral Daily   nutrition supplement (JUVEN)  1 packet Oral BID BM   potassium & sodium phosphates  1 packet Oral TID WC & HS   Continuous Infusions:  sodium chloride 50 mL/hr at 04/21/21 1248     LOS: 4 days   Joseph Art  [facility]   How to contact the [facility] Attending or Consulting provider 7A - 7P or covering provider during after hours 7P -7A, for this patient?  Check the care team in [facility] and look for a) attending/consulting TRH provider listed and b) the [facility] team listed Log into [email or web address] and use [place]'s universal password to  access. If you do not have the password, please contact the hospital operator. Locate the [facility] provider you are looking for under [facility] and page to a number that you can be directly reached. If you still have difficulty reaching the provider, please page the [facility] (Director on Call) for the Hospitalists listed on amion for assistance.  04/21/2021, 2:08 PM

## 2021-04-22 LAB — CBC
HCT: 24.3 % — ABNORMAL LOW (ref 39.0–52.0)
Hemoglobin: 8.2 g/dL — ABNORMAL LOW (ref 13.0–17.0)
MCH: 29.2 pg (ref 26.0–34.0)
MCHC: 33.7 g/dL (ref 30.0–36.0)
MCV: 86.5 fL (ref 80.0–100.0)
Platelets: 79 10*3/uL — ABNORMAL LOW (ref 150–400)
RBC: 2.81 MIL/uL — ABNORMAL LOW (ref 4.22–5.81)
RDW: 19.2 % — ABNORMAL HIGH (ref 11.5–15.5)
WBC: 5 10*3/uL (ref 4.0–10.5)
nRBC: 0 % (ref 0.0–0.2)

## 2021-04-22 LAB — BASIC METABOLIC PANEL WITH GFR
Anion gap: 8 (ref 5–15)
BUN: 16 mg/dL (ref 8–23)
CO2: 27 mmol/L (ref 22–32)
Calcium: 7.6 mg/dL — ABNORMAL LOW (ref 8.9–10.3)
Chloride: 88 mmol/L — ABNORMAL LOW (ref 98–111)
Creatinine, Ser: 0.41 mg/dL — ABNORMAL LOW (ref 0.61–1.24)
GFR, Estimated: >60 mL/min
Glucose, Bld: 116 mg/dL — ABNORMAL HIGH (ref 70–99)
Potassium: 4.6 mmol/L (ref 3.5–5.1)
Sodium: 123 mmol/L — ABNORMAL LOW (ref 135–145)

## 2021-04-22 LAB — MAGNESIUM: Magnesium: 1.3 mg/dL — ABNORMAL LOW (ref 1.7–2.4)

## 2021-04-22 LAB — PHOSPHORUS: Phosphorus: 3.2 mg/dL (ref 2.5–4.6)

## 2021-04-22 MED ORDER — ENSURE ENLIVE PO LIQD
237.0000 mL | Freq: Three times a day (TID) | ORAL | Status: DC
  Administered 2021-04-22 – 2021-04-26 (×11): 237 mL via ORAL

## 2021-04-22 MED ORDER — MAGNESIUM SULFATE 2 GM/50ML IV SOLN
2.0000 g | Freq: Once | INTRAVENOUS | Status: AC
  Administered 2021-04-22: 2 g via INTRAVENOUS
  Filled 2021-04-22: qty 50

## 2021-04-22 MED ORDER — ORAL CARE MOUTH RINSE
15.0000 mL | Freq: Two times a day (BID) | OROMUCOSAL | Status: DC
  Administered 2021-04-22 – 2021-04-26 (×7): 15 mL via OROMUCOSAL

## 2021-04-22 NOTE — Progress Notes (Signed)
PT Cancellation Note  Patient Details Name: Robert [name] MRN: [id number] DOB: 05-19-1940   Cancelled Treatment:    Reason Eval/Treat Not Completed: Other (comment).  Pt is asleep today when PT checks on him, so will re-attempt tomorrow for follow up care.   Ramond Dial 04/22/2021, 3:49 PM  Mee Hives, PT PhD Acute Rehab Dept. Number: [place]

## 2021-04-22 NOTE — Progress Notes (Signed)
°   04/22/21 0048  Assess: MEWS Score  Pulse Rate (!) 108  Assess: MEWS Score  MEWS Temp 0  MEWS Systolic 1  MEWS Pulse 1  MEWS RR 0  MEWS LOC 0  MEWS Score 2  MEWS Score Color Yellow  Assess: if the MEWS score is Yellow or Red  Were vital signs taken at a resting state? Yes  Focused Assessment No change from prior assessment  Early Detection of Sepsis Score *See Row Information* Low  MEWS guidelines implemented *See Row Information* No, previously yellow, continue vital signs every 4 hours  Document  Patient Outcome Stabilized after interventions  Progress note created (see row info) Yes

## 2021-04-22 NOTE — Progress Notes (Addendum)
Calorie Count Note  48 hour calorie count ordered.  Diet: Dysphagia 3 with thin liquids (was on full liquids at time of last RD assessment) Supplements: Ensure Enlive BID, Vital Cuisine Shakes TID, Magic Cup TID  Estimated Nutritional Needs:  Kcal:  2200-2400 Protein:  100-115 grams Fluid:  >2.2 L  Day 1 Results (1/01): Breakfast: 200 kcals, 8 grams protein Lunch: 146 kcals, 6 grams protein Dinner: 150 kcals, 2 grams protein Supplements: Note that it was documented that pt consumed 2 Ensures and 1 Boost Breeze, but specific percentage of supplement consumed was not documented and RD unable to obtain clarification at this time. If pt consumed 100% of 2 Ensures and 1 Boost Breeze, he would have consumed an additional 950 kcals and 35 grams of protein  Total intake: 1446 kcal (66% of minimum estimated needs)  51 grams protein (51% of minimum estimated needs)   Note, without supplement consumption and based on meal intake alone, pt only meeting 22.5% and 16% of minimum estimated calorie and protein needs, respectively  Nutrition Dx: Increased nutrient needs related to cancer and cancer related treatments as evidenced by estimated needs.  Goal: Patient will meet greater than or equal to 90% of their needs  Intervention:  -continue calorie count, RD to follow-up with Day 2 results tomorrow, 04/23/20 -Ensure Enlive po TID, each supplement provides 350 kcal and 20 grams of protein -continue MVI with minerals daily -d/c Magic Cup -d/c Vital Cuisine shakes   Kaylyn Garrow A., MS, RD, LDN (she/her/hers) RD pager number and weekend/on-call pager number located in [place].

## 2021-04-22 NOTE — Progress Notes (Signed)
TRH night cross cover note:  I was notified by RN that this patient who is here with atrial fibrillation RVR, has morning lab results notable for serum magnesium level 1.3.  Consequently, I placed order for magnesium sulfate 2 g IV over 2 hours x 1 dose now.    Babs Bertin, DO Hospitalist

## 2021-04-22 NOTE — Progress Notes (Signed)
°   04/21/21 1940 04/22/21 0011  Assess: MEWS Score  Temp  --  98 F (36.7 C)  BP  --  91/67  Pulse Rate  --  78  ECG Heart Rate  --  (!) 116  Resp  --  16  SpO2  --  99 %  O2 Device  --  Room Air  Assess: MEWS Score  MEWS Temp 0 0  MEWS Systolic 1 1  MEWS Pulse 1 2  MEWS RR 0 0  MEWS LOC 0 0  MEWS Score 2 3  MEWS Score Color Yellow Yellow  Assess: if the MEWS score is Yellow or Red  Were vital signs taken at a resting state?  --  Yes  Focused Assessment  --  No change from prior assessment  Early Detection of Sepsis Score *See Row Information*  --  Low  MEWS guidelines implemented *See Row Information*  --  No, previously yellow, continue vital signs every 4 hours  Document  Patient Outcome  --  Stabilized after interventions  Progress note created (see row info)  --  Yes

## 2021-04-22 NOTE — TOC Progression Note (Signed)
Transition of Care [facility]) - Progression Note    Patient Details  Name: Robert [name] MRN: [id number] Date of Birth: 24-Jun-1940  [facility]) CM/SW [place], [place] Phone Number: 04/22/2021, 3:38 PM  Clinical Narrative:    CSW contacted pt spouse to follow up on pt DC plan, pt spouse asked about the facilities and how the DC would work with pt declining. CSW explained that SNF is short term, pt  spouse was under the impression that it was long term bc she can no longer take care of pt at home at all. CSW suggested following up with pt spouse after speaking with the doctor tomorrow. Pt spouse was agreeable and understanding. CSW will continue to follow for DC planning needs.   Expected Discharge Plan: [place] Barriers to Discharge: Continued Medical Work up  Expected Discharge Plan and Services Expected Discharge Plan: [place] arrangements for the past 2 months: Single Family Home                                       Social Determinants of Health (SDOH) Interventions    Readmission Risk Interventions No flowsheet data found.

## 2021-04-22 NOTE — Progress Notes (Signed)
°   04/21/21 1940  Assess: MEWS Score  Level of Consciousness Alert  Assess: MEWS Score  MEWS Temp 0  MEWS Systolic 1  MEWS Pulse 1  MEWS RR 0  MEWS LOC 0  MEWS Score 2  MEWS Score Color Yellow  Assess: if the MEWS score is Yellow or Red  Were vital signs taken at a resting state? Yes  Focused Assessment No change from prior assessment  Early Detection of Sepsis Score *See Row Information* Low  MEWS guidelines implemented *See Row Information* No, other (Comment) (HR has been high but improving)  Document  Patient Outcome Stabilized after interventions  Progress note created (see row info) Yes

## 2021-04-22 NOTE — Progress Notes (Signed)
Progress Note    Robert [name]  WUJ:[id number] DOB: 1940/05/10  DOA: 04/17/2021 PCP: Farris Has, MD    Brief Narrative:    Medical records reviewed and are as summarized below:  Robert [name] is an 81 y.o. 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 fib on Xarelto, chronic diastolic CHF, severe mitral regurgitation, presented with rapid A. Fib and low hgb.  Patient's HR has been difficult to control.  Slow to improve. Many medical issues.  He and family are re-considering if he wants the G tube or not- will re-assess on Monday.   Assessment/Plan:   Principal Problem:   Atrial fibrillation with RVR (HCC) Active Problems:   Permanent atrial fibrillation (HCC)   A. fib with RVR -cards consult appreciated  -titrate up BB -add low dose dig -Continue to hold anticoagulation due to severe anemia and severe thrombocytopenia consider resumption after G tube   Severe hypokalemia -replete to 4   Severe hypomagnesemia -replete to 2   Hyponatremia -appears to be hypovolemia as urine Na >40 -add gentle IVF and trend   New onset of pancytopenia -Likely related to recent chemotherapy and immunotherapy. -oncology consult -s/p 2 units PRBC -trend   Chronic diastolic CHF -holding lasix due to hypotension -high risk of 3rd spacing with poor PO nutrition   Head and neck metastatic squamous cell carcinoma -poor overall prognosis -defer to oncology consult for continued GOC   Failure to thrive -unable to get PEG Tube- family and wife are re-considering if he wants that -consult dietitian. -calorie count   Pressure Injury 04/18/21 Sacrum Unstageable - Full thickness tissue loss in which the base of the injury is covered by slough (yellow, tan, gray, green or brown) and/or eschar (tan, brown or black) in the wound bed. yellow slough in wound bed---see [facility] (Active)   04/18/21 1550 (see WOCRN progress note)  Location: Sacrum  Location Orientation:   Staging: Unstageable - Full thickness tissue loss in which the base of the injury is covered by slough (yellow, tan, gray, green or brown) and/or eschar (tan, brown or black) in the wound bed. (see WOCRN progress note)  Wound Description (Comments): yellow slough in wound bed---see [facility] progress note for details and measurements  Present on Admission: Yes  Sacral decubitus -WOC consult -un-stage-able   -POA  Difficult situation.  Will continue with medical care sans CPR/intubation and await Dr. Gwenyth Bouillon     Family Communication/Anticipated D/C date and plan/Code Status   DVT prophylaxis: scd Code Status: DNR Family Communication: daughter at bedside Disposition Plan: Status is: Inpatient  Remains inpatient appropriate because: needs nutrition status addressed along with a fib/ g tube         Medical Consultants:   IR Oncology Cards Palliative care     Subjective:   Overall he is feeling better  Objective:    Vitals:   04/22/21 0730 04/22/21 0731 04/22/21 1131 04/22/21 1132  BP: (!) 92/58  103/64   Pulse: 82  (!) 104   Resp: 20  (!) 24   Temp:  98.5 F (36.9 C)  98 F (36.7 C)  TempSrc:  Oral  Oral  SpO2: 98%  93%   Weight:      Height:        Intake/Output Summary (Last 24 hours) at 04/22/2021 1349 Last data filed at 04/22/2021 1308 Gross per 24 hour  Intake 1364 ml  Output 1950  ml  Net -586 ml   Filed Weights   04/20/21 0400 04/21/21 0418 04/22/21 0207  Weight: 79 kg 77.1 kg 77.8 kg    Exam:   General: Appearance:    Chronically ill male in no acute distress. Laying flat     Lungs:      respirations unlabored  Heart:    Tachycardic. irregular  MS:   All extremities are intact.    Neurologic:   Awake, alert, oriented x 3.         Data Reviewed:   I have personally reviewed following labs and imaging studies:  Labs: Labs show the following:    Basic Metabolic Panel: Recent Labs  Lab 04/17/21 1325 04/18/21 0322 04/19/21 0312 04/20/21 0420 04/21/21 0338 04/21/21 1543 04/22/21 0152  NA 126* 128* 124* 124* 121* 123* 123*  K 2.8* 3.2* 3.6 3.7 3.8 3.8 4.6  CL 91* 94* 93* 92* 87* 88* 88*  CO2 24 24 25 25 25 26 27   GLUCOSE 95 88 104* 113* 109* 122* 116*  BUN 17 13 10 10 14 13 16   CREATININE 0.65 0.51* 0.45* 0.48* 0.42* 0.48* 0.41*  CALCIUM 7.4* 7.3* 7.2* 7.3* 7.4* 7.9* 7.6*  MG 1.3* 1.6* 1.5* 1.7  --   --  1.3*  PHOS  --   --  1.9* 2.7  --   --  3.2   GFR Estimated Creatinine Clearance: 83.5 mL/min (A) (by C-G formula based on SCr of 0.41 mg/dL (L)). Liver Function Tests: Recent Labs  Lab 04/17/21 1325  AST 19  ALT 18  ALKPHOS 81  BILITOT 1.6*  PROT 5.0*  ALBUMIN 2.1*   No results for input(s): LIPASE, AMYLASE in the last 168 hours. No results for input(s): AMMONIA in the last 168 hours. Coagulation profile Recent Labs  Lab 04/17/21 1112 04/17/21 1325  INR 1.2 1.3*    CBC: Recent Labs  Lab 04/17/21 1325 04/18/21 0322 04/19/21 0312 04/20/21 0420 04/21/21 0338 04/22/21 0152  WBC 1.4* 2.6* 4.6 5.3 5.9 5.0  NEUTROABS 1.0*  --   --   --   --   --   HGB 6.2* 7.7* 8.0* 7.4* 7.9* 8.2*  HCT 18.7* 23.3* 24.5* 22.6* 23.8* 24.3*  MCV 89.5 86.3 86.0 85.6 86.2 86.5  PLT 60* 63* 67* 65* 68* 79*   Cardiac Enzymes: No results for input(s): CKTOTAL, CKMB, CKMBINDEX, TROPONINI in the last 168 hours. BNP (last 3 results) No results for input(s): PROBNP in the last 8760 hours. CBG: No results for input(s): GLUCAP in the last 168 hours. D-Dimer: No results for input(s): DDIMER in the last 72 hours. Hgb A1c: No results for input(s): HGBA1C in the last 72 hours. Lipid Profile: No results for input(s): CHOL, HDL, LDLCALC, TRIG, CHOLHDL, LDLDIRECT in the last 72 hours. Thyroid function studies: No results for input(s): TSH, T4TOTAL, T3FREE, THYROIDAB in the last 72 hours.  Invalid input(s): FREET3 Anemia work  up: No results for input(s): VITAMINB12, FOLATE, FERRITIN, TIBC, IRON, RETICCTPCT in the last 72 hours.  Sepsis Labs: Recent Labs  Lab 04/19/21 0312 04/20/21 0420 04/21/21 0338 04/22/21 0152  WBC 4.6 5.3 5.9 5.0    Microbiology Recent Results (from the past 240 hour(s))  Resp Panel by RT-PCR (Flu A&B, Covid) Nasopharyngeal Swab     Status: None   Collection Time: 04/17/21  3:40 PM   Specimen: Nasopharyngeal Swab; Nasopharyngeal(NP) swabs in vial transport medium  Result Value Ref Range Status   SARS Coronavirus 2 by RT PCR NEGATIVE  NEGATIVE Final    Comment: (NOTE) SARS-CoV-2 target nucleic acids are NOT DETECTED.  The SARS-CoV-2 RNA is generally detectable in upper respiratory specimens during the acute phase of infection. The lowest concentration of SARS-CoV-2 viral copies this assay can detect is 138 copies/mL. A negative result does not preclude SARS-Cov-2 infection and should not be used as the sole basis for treatment or other patient management decisions. A negative result may occur with  improper specimen collection/handling, submission of specimen other than nasopharyngeal swab, presence of viral mutation(s) within the areas targeted by this assay, and inadequate number of viral copies(<138 copies/mL). A negative result must be combined with clinical observations, patient history, and epidemiological information. The expected result is Negative.  Fact Sheet for Patients:  BloggerCourse.com  Fact Sheet for Healthcare Providers:  SeriousBroker.it  This test is no t yet approved or cleared by the [place] FDA and  has been authorized for detection and/or diagnosis of SARS-CoV-2 by FDA under an Emergency Use Authorization (EUA). This EUA will remain  in effect (meaning this test can be used) for the duration of the COVID-19 declaration under Section 564(b)(1) of the Act, 21 U.S.C.section 360bbb-3(b)(1), unless  the authorization is terminated  or revoked sooner.       Influenza A by PCR NEGATIVE NEGATIVE Final   Influenza B by PCR NEGATIVE NEGATIVE Final    Comment: (NOTE) The Xpert Xpress SARS-CoV-2/FLU/RSV plus assay is intended as an aid in the diagnosis of influenza from Nasopharyngeal swab specimens and should not be used as a sole basis for treatment. Nasal washings and aspirates are unacceptable for Xpert Xpress SARS-CoV-2/FLU/RSV testing.  Fact Sheet for Patients: BloggerCourse.com  Fact Sheet for Healthcare Providers: SeriousBroker.it  This test is not yet approved or cleared by the [place] FDA and has been authorized for detection and/or diagnosis of SARS-CoV-2 by FDA under an Emergency Use Authorization (EUA). This EUA will remain in effect (meaning this test can be used) for the duration of the COVID-19 declaration under Section 564(b)(1) of the Act, 21 U.S.C. section 360bbb-3(b)(1), unless the authorization is terminated or revoked.  Performed at [facility], [address], [place], [place] [address]     Procedures and diagnostic studies:  No results found.  Medications:    collagenase   Topical BID   digoxin  0.0625 mg Oral Daily   feeding supplement  237 mL Oral TID BM   metoprolol tartrate  25 mg Oral BID   mirtazapine  7.5 mg Oral QHS   multivitamin with minerals  1 tablet Oral Daily   nutrition supplement (JUVEN)  1 packet Oral BID BM   Continuous Infusions:  sodium chloride 100 mL/hr at 04/22/21 1006     LOS: 5 days   Joseph Art  [facility]   How to contact the [facility] Attending or Consulting provider 7A - 7P or covering provider during after hours 7P -7A, for this patient?  Check the care team in [facility] and look for a) attending/consulting TR[facility] provider listed and b) the [facility] team listed Log into [email or web address] and use [place]'s universal password to access. If you do not have  the password, please contact the hospital operator. Locate the [facility] provider you are looking for under [facility] and page to a number that you can be directly reached. If you still have difficulty reaching the provider, please page the [facility] (Director on Call) for the Hospitalists listed on amion for assistance.  04/22/2021, 1:49 PM

## 2021-04-23 ENCOUNTER — Encounter: Payer: Medicare Other | Admitting: Nutrition

## 2021-04-23 DIAGNOSIS — I4891 Unspecified atrial fibrillation: Secondary | ICD-10-CM | POA: Diagnosis not present

## 2021-04-23 LAB — CBC
HCT: 21 % — ABNORMAL LOW (ref 39.0–52.0)
Hemoglobin: 7 g/dL — ABNORMAL LOW (ref 13.0–17.0)
MCH: 28.8 pg (ref 26.0–34.0)
MCHC: 33.3 g/dL (ref 30.0–36.0)
MCV: 86.4 fL (ref 80.0–100.0)
Platelets: 86 10*3/uL — ABNORMAL LOW (ref 150–400)
RBC: 2.43 MIL/uL — ABNORMAL LOW (ref 4.22–5.81)
RDW: 19.4 % — ABNORMAL HIGH (ref 11.5–15.5)
WBC: 4.1 10*3/uL (ref 4.0–10.5)
nRBC: 0 % (ref 0.0–0.2)

## 2021-04-23 LAB — BASIC METABOLIC PANEL
Anion gap: 8 (ref 5–15)
BUN: 11 mg/dL (ref 8–23)
CO2: 24 mmol/L (ref 22–32)
Calcium: 7.3 mg/dL — ABNORMAL LOW (ref 8.9–10.3)
Chloride: 91 mmol/L — ABNORMAL LOW (ref 98–111)
Creatinine, Ser: 0.33 mg/dL — ABNORMAL LOW (ref 0.61–1.24)
GFR, Estimated: >60 mL/min (ref >60)
Glucose, Bld: 102 mg/dL — ABNORMAL HIGH (ref 70–99)
Potassium: 3.8 mmol/L (ref 3.5–5.1)
Sodium: 123 mmol/L — ABNORMAL LOW (ref 135–145)

## 2021-04-23 LAB — PREPARE RBC (CROSSMATCH)

## 2021-04-23 LAB — MAGNESIUM: Magnesium: 1.5 mg/dL — ABNORMAL LOW (ref 1.7–2.4)

## 2021-04-23 MED ORDER — DIGOXIN 125 MCG PO TABS
0.1250 mg | ORAL_TABLET | Freq: Every day | ORAL | Status: DC
  Administered 2021-04-24 – 2021-04-26 (×3): 0.125 mg via ORAL
  Filled 2021-04-23 (×3): qty 1

## 2021-04-23 MED ORDER — LORAZEPAM 2 MG/ML IJ SOLN: 1.0000 mg | INTRAMUSCULAR | Status: DC | PRN

## 2021-04-23 MED ORDER — LORAZEPAM 2 MG/ML PO CONC: 1.0000 mg | ORAL | Status: DC | PRN

## 2021-04-23 MED ORDER — LORAZEPAM 1 MG PO TABS: 1.0000 mg | ORAL_TABLET | ORAL | Status: DC | PRN

## 2021-04-23 MED ORDER — GERHARDT'S BUTT CREAM
TOPICAL_CREAM | Freq: Three times a day (TID) | CUTANEOUS | Status: DC
  Administered 2021-04-25: 1 via TOPICAL
  Filled 2021-04-23: qty 1

## 2021-04-23 MED ORDER — SODIUM CHLORIDE 0.9% IV SOLUTION: Freq: Once | INTRAVENOUS | Status: AC

## 2021-04-23 MED ORDER — MORPHINE SULFATE (CONCENTRATE) 10 MG/0.5ML PO SOLN: 5.0000 mg | ORAL | Status: DC | PRN

## 2021-04-23 NOTE — Progress Notes (Signed)
HEMATOLOGY-ONCOLOGY PROGRESS NOTE  ASSESSMENT AND PLAN: Mr. 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  He had metastatic disease in October 2022 with disease involving the lung as well as mediastinal lymphadenopathy and multiple suspicious liver lesions as well as multiple osseous metastasis and right adrenal suspicious for metastatic disease.  He has been receiving systemic chemotherapy with carboplatin for an AUC of 5, paclitaxel 175 mg per metered squared, and Keytruda 200 mg IV every 3 weeks.  He experienced fatigue and weight loss on therapy.  At his last visit, his treatment was delayed by 1 week.  Due to poor nutritional status, a feeding tube was recommended.  IR has attempted to place this tube on 2 occasions but on both occasions he developed A. fib and the procedure could not be performed.  On the second attempt, he was subsequently sent to the emergency department for further evaluation of his A. fib.  He was also noted to be anemic.  During this hospitalization, he was seen by cardiology for his A. fib and they are titrating up his plan of blocker.  They have also added low-dose dig.  Despite this, he remains tachycardic.  His anticoagulation has been placed on hold secondary to anemia and thrombocytopenia.  He is also having issues with electrolyte abnormalities.  We discussed the incurable nature of his cancer.  We further discussed that due to his poor nutritional status and poor ECOG performance status (estimated to be 3-4) he is not a candidate for additional chemotherapy at this time.  Unfortunately, I do not see a way to safely place a feeding tube given his ongoing A. fib with RVR and hypotension. The patient expressed to me that he thinks chemotherapy in his  current state would not be beneficial.  Further discussed shifting her focus to focusing on comfort and consider referral to hospice.  He is currently thinking about this.  His daughter expressed concern about taking him home with hospice as they have limited support in the home.  She would like to hear about other options in terms of disposition.  I would also recommend follow-up by the palliative care team to further discuss final decision regarding hospice and goals of care and to assist with discharge planning.  For now, would recommend PRBC transfusion for hemoglobin less than 8 pending final decision regarding hospice.  SUBJECTIVE: I met with the patient and his daughter in his hospital room.  The patient reports that he has not been getting out of bed at all since he has been hospitalized.  His nutritional status is fairly poor at this time.  He states that he is able to take in liquids fairly easily and can take in some soft foods as well.  He currently denies pain.  No bleeding reported.  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[place])   10/05/2020 Cancer Staging   Staging form: Oral Cavity, AJCC 8th Edition - Pathologic stage from 10/05/2020: Stage IVA (pT4a, pN2a, cM0) - Signed by Eppie Gibson, MD on 10/05/2020 Stage prefix: Initial diagnosis Histologic grade (G): G2 Histologic grading system: 3 grade system Presence of extranodal extension: Present Perineural invasion (PNI): Present    SCC (squamous cell carcinoma of lung) ([place])  02/18/2021 Initial Diagnosis   SCC (squamous cell carcinoma of lung) ([place])   02/27/2021 -  Chemotherapy   Patient is on Treatment Plan : LUNG NSCLC Carboplatin + Paclitaxel + Pembrolizumab q21d x 4 cycles / Pembrolizumab Maintenance Q21D        REVIEW OF SYSTEMS:   Review of Systems  Constitutional:  Positive for malaise/fatigue and weight loss. Negative for fever.  HENT:         Difficulty  swallowing  Eyes: Negative.   Respiratory:  Negative for shortness of breath.   Cardiovascular:  Negative for chest pain.  Gastrointestinal:  Negative for abdominal pain, nausea and vomiting.  Musculoskeletal: Negative.   Skin: Negative.   Neurological: Negative.   Endo/Heme/Allergies: Negative.   Psychiatric/Behavioral: Negative.     I have reviewed the past medical history, past surgical history, social history and family history with the patient and they are unchanged from previous note.   PHYSICAL EXAMINATION: ECOG PERFORMANCE STATUS: 4 - Bedbound  Vitals:   04/23/21 1129 04/23/21 1400  BP:  106/69  Pulse:  (!) 105  Resp:    Temp: 98 F (36.7 C) 98.1 F (36.7 C)  SpO2:     Filed Weights   04/21/21 0418 04/22/21 0207 04/23/21 0011  Weight: 77.1 kg 77.8 kg 78.1 kg    Intake/Output from previous day: 01/02 0701 - 01/03 0700 In: 2554.7 [P.O.:1257; I.V.:1297.7] Out: 2150 [Urine:2150]  Physical Exam Vitals reviewed.  Constitutional:      General: He is not in acute distress.    Appearance: He is ill-appearing.  HENT:     Head: Normocephalic.  Cardiovascular:     Rate and Rhythm: Tachycardia present.  Pulmonary:     Effort: Pulmonary effort is normal. No respiratory distress.  Musculoskeletal:        General: Normal range of motion.  Skin:    General: Skin is warm and dry.  Neurological:     General: No focal deficit present.     Mental Status: He is alert and oriented to person, place, and time.    LABORATORY DATA:  I have reviewed the data as listed CMP Latest Ref Rng & Units 04/23/2021 04/22/2021 04/21/2021  Glucose 70 - 99 mg/dL 102(H) 116(H) 122(H)  BUN 8 - 23 mg/dL _0 Creatinine 0.61 - 1.24 mg/dL 0.33(L) 0.41(L) 0.48(L)  Sodium 135 - 145 mmol/L 123(L) 123(L) 123(L)  Potassium 3.5 - 5.1 mmol/L 3.8 4.6 3.8  Chloride 98 - 111 mmol/L 91(L) 88(L) 88(L)  CO2 22 - 32 mmol/L _1 Calcium 8.9 - 10.3 mg/dL 7.3(L) 7.6(L) 7.9(L)  Total Protein 6.5 -  8.1 g/dL - - -  Total Bilirubin 0.3 - 1.2 mg/dL - - -  Alkaline Phos 38 - 126 U/L - - -  AST 15 - 41 U/L - - -  ALT 0 - 44 U/L - - -    Lab Results  Component Value Date   WBC 4.1 04/23/2021   HGB 7.0 (L) 04/23/2021   HCT 21.0 (L) 04/23/2021   MCV 86.4 04/23/2021   PLT 86 (L) 04/23/2021   NEUTROABS 1.0 (L) 04/17/2021    No results found for: CEA1, CEA, CAN199, CA125, PSA1  IR Fluoro Rm 30-60 Min  Result Date: 04/10/2021 CLINICAL DATA:  81 year old male with history of metastatic squamous cell carcinoma. Percutaneous gastrostomy tube requested for supplemental nutrition. EXAM: IR FLUORO RM 0-60 MIN ANESTHESIA/SEDATION: None. MEDICATIONS: None. CONTRAST:  None. PROCEDURE: None. COMPLICATIONS: None immediate FINDINGS:  Immediately prior to procedure, the patient was noted to be in atrial fibrillation with rapid ventricular response. Systolic blood pressures were in the 90s, similar to his recorded baseline. The patient was asymptomatic. Patient was given 500 mL normal saline bolus without change in atrial fibrillation. An EKG was obtained. The patient was then transferred to the emergency department for further evaluation. IMPRESSION: Gastrostomy tube placement aborted due to new onset atrial fibrillation with RVR. The patient was transferred to the emergency department in stable condition. PLAN: Gastrostomy tube placement on an outpatient basis can be rescheduled at any time. Ruthann Cancer, MD [facility] Radiology Electronically Signed   By: Ruthann Cancer M.D.   On: 04/10/2021 16:57   DG Chest Portable 1 View  Result Date: 04/17/2021 CLINICAL DATA:  Malnutrition. EXAM: PORTABLE CHEST 1 VIEW COMPARISON:  One view chest x-ray 04/10/2021 FINDINGS: The heart is enlarged. Mild edema again noted. Small effusions are present. Patchy airspace opacities are again noted in the right mid lung and left lower lobe. IMPRESSION: 1. Stable cardiomegaly and  mild edema. 2. Small bilateral pleural effusions. 3. Patchy bilateral airspace disease likely reflects atelectasis. Infection is not excluded. Electronically Signed   By: [place] Morelle M.D.   On: 04/17/2021 12:58   DG Chest Port 1 View  Result Date: 04/10/2021 CLINICAL DATA:  atrial fib.  Hypertension EXAM: PORTABLE CHEST 1 VIEW COMPARISON:  CT chest 02/09/2021, CT PET 03/04/2021 FINDINGS: The heart and mediastinal contours are within normal limits. Low lung volumes. Biapical pleural/pulmonary scarring. Patchy airspace opacity and nodularities consistent with known non-small cell lung cancer question 1 cm cavitary lesion at the right base. No pulmonary edema. Blunting of bilateral costophrenic angles likely representing trace pleural effusions. No pneumothorax. No acute osseous abnormality. IMPRESSION: 1. Low lung volumes with patchy airspace opacity and nodularities consistent with known non-small cell lung cancer. Superimposed infection/inflammation not excluded. 2. Question 1 cm cavitary lesion at the right base. Electronically Signed   By: Iven Finn M.D.   On: 04/10/2021 15:40     Future Appointments  Date Time Provider [place]  04/24/2021 11:30 AM Curt Bears, MD [facility] None  04/29/2021 10:00 AM [facility] LAB [facility] None  04/29/2021 10:30 AM Heilingoetter, Cassandra L, PA-C [facility] None  04/29/2021 11:30 AM [facility] INFUSION [facility] None  04/29/2021  1:30 PM Pickenpack-Cousar, Carlena Sax, NP [facility] None  04/29/2021  1:45 PM Karie Mainland, RD [facility] None  05/01/2021  1:00 PM [facility] [place] FLUSH [facility] None  05/02/2021  8:30 AM [facility]/VAS 2 [facility] None  05/10/2021  8:30 AM Adrian Prows, MD [facility] None  05/20/2021 10:00 AM [facility] LAB [facility] None  05/20/2021 10:45 AM Curt Bears, MD [facility] None  05/20/2021 11:30 AM [facility] INFUSION [facility] None  05/20/2021 12:00 PM Karie Mainland, RD [facility] None  06/10/2021   9:30 AM [facility] LAB [facility] None  06/10/2021 10:00 AM Heilingoetter, Cassandra L, PA-C [facility] None  06/10/2021 11:00 AM [facility] INFUSION [facility] None      LOS: 6 days   Mikey Bussing, DNP, AGPCNP-BC, AOCNP 04/23/21

## 2021-04-23 NOTE — Progress Notes (Signed)
Progress Note    Robert [name]  WUJ:[id number] DOB: 1940/12/27  DOA: 04/17/2021 PCP: Farris Has, MD    Brief Narrative:    Medical records reviewed and are as summarized below:  Robert [name] is an 81 y.o. 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 fib on Xarelto, chronic diastolic CHF, severe mitral regurgitation, presented with rapid A. Fib and low hgb.  Patient's HR has been difficult to control.  Slow to improve. Many medical issues.  Now patient and family has decided on hospice.  ? Residential hospice appropriate.  TOC consult   Assessment/Plan:   Principal Problem:   Atrial fibrillation with RVR (HCC) Active Problems:   Permanent atrial fibrillation (HCC)   A. fib with RVR -cards consult appreciated  -titrate up BB -add low dose dig -now transitioning to hospice   Severe hypokalemia  Severe hypomagnesemia -now transitioning to hospice   Hyponatremia -appears to be hypovolemia as urine Na >40 -now transitioning to hospice   New onset of pancytopenia -Likely related to recent chemotherapy and immunotherapy. -oncology consult -s/p 3 units PRBC   Chronic diastolic CHF -holding lasix due to hypotension -high risk of 3rd spacing with poor PO nutrition -transition to hospice care   Head and neck metastatic squamous cell carcinoma -poor overall prognosis -defer to oncology consult for continued GOC   Failure to thrive -calorie count <30%   Pressure Injury 04/18/21 Sacrum Unstageable - Full thickness tissue loss in which the base of the injury is covered by slough (yellow, tan, gray, green or brown) and/or eschar (tan, brown or black) in the wound bed. yellow slough in wound bed---see [facility] (Active)  04/18/21 1550 (see WOCRN progress note)  Location: Sacrum  Location Orientation:   Staging: Unstageable - Full thickness tissue loss in which the base  of the injury is covered by slough (yellow, tan, gray, green or brown) and/or eschar (tan, brown or black) in the wound bed. (see WOCRN progress note)  Wound Description (Comments): yellow slough in wound bed---see [facility] progress note for details and measurements  Present on Admission: Yes  Sacral decubitus -WOC consult -un-stage-able   -POA    Difficult situation.  Family and patient now accepting of hospice care.  TOC consult     Family Communication/Anticipated D/C date and plan/Code Status   DVT prophylaxis: scd Code Status: DNR Family Communication: daughter at bedside/wife at bedside earlier Disposition Plan: Status is: Inpatient  Residential hospice?         Medical Consultants:   IR Oncology Cards Palliative care     Subjective:   Spoke with oncology and plan is for hospice  Objective:    Vitals:   04/23/21 1002 04/23/21 1056 04/23/21 1129 04/23/21 1400  BP: (!) 98/59   106/69  Pulse:  (!) 118  (!) 105  Resp:      Temp:  97.9 F (36.6 C) 98 F (36.7 C) 98.1 F (36.7 C)  TempSrc:  Oral Oral Oral  SpO2:      Weight:      Height:        Intake/Output Summary (Last 24 hours) at 04/23/2021 1556 Last data filed at 04/23/2021 1400 Gross per 24 hour  Intake 3837.96 ml  Output 2650 ml  Net 1187.96 ml   Filed Weights   04/21/21 0418 04/22/21 0207 04/23/21 0011  Weight: 77.1 kg 77.8 kg 78.1 kg    Exam:  General: Appearance:    frail male in no acute distress     Lungs:     respirations unlabored  Heart:    Tachycardic.   MS:   All extremities are intact.    Neurologic:   Awake, alert           Data Reviewed:   I have personally reviewed following labs and imaging studies:  Labs: Labs show the following:   Basic Metabolic Panel: Recent Labs  Lab 04/17/21 1325 04/18/21 0322 04/19/21 0312 04/20/21 0420 04/21/21 0338 04/21/21 1543 04/22/21 0152 04/23/21 0431  NA 126* 128* 124* 124* 121* 123* 123* 123*  K 2.8* 3.2* 3.6  3.7 3.8 3.8 4.6 3.8  CL 91* 94* 93* 92* 87* 88* 88* 91*  CO2 24 24 25 25 25 26 27 24   GLUCOSE 95 88 104* 113* 109* 122* 116* 102*  BUN 17 13 10 10 14 13 16 11   CREATININE 0.65 0.51* 0.45* 0.48* 0.42* 0.48* 0.41* 0.33*  CALCIUM 7.4* 7.3* 7.2* 7.3* 7.4* 7.9* 7.6* 7.3*  MG 1.3* 1.6* 1.5* 1.7  --   --  1.3*  --   PHOS  --   --  1.9* 2.7  --   --  3.2  --    GFR Estimated Creatinine Clearance: 83.5 mL/min (A) (by C-G formula based on SCr of 0.33 mg/dL (L)). Liver Function Tests: Recent Labs  Lab 04/17/21 1325  AST 19  ALT 18  ALKPHOS 81  BILITOT 1.6*  PROT 5.0*  ALBUMIN 2.1*   No results for input(s): LIPASE, AMYLASE in the last 168 hours. No results for input(s): AMMONIA in the last 168 hours. Coagulation profile Recent Labs  Lab 04/17/21 1112 04/17/21 1325  INR 1.2 1.3*    CBC: Recent Labs  Lab 04/17/21 1325 04/18/21 0322 04/19/21 0312 04/20/21 0420 04/21/21 0338 04/22/21 0152 04/23/21 0431  WBC 1.4*   < > 4.6 5.3 5.9 5.0 4.1  NEUTROABS 1.0*  --   --   --   --   --   --   HGB 6.2*   < > 8.0* 7.4* 7.9* 8.2* 7.0*  HCT 18.7*   < > 24.5* 22.6* 23.8* 24.3* 21.0*  MCV 89.5   < > 86.0 85.6 86.2 86.5 86.4  PLT 60*   < > 67* 65* 68* 79* 86*   < > = values in this interval not displayed.   Cardiac Enzymes: No results for input(s): CKTOTAL, CKMB, CKMBINDEX, TROPONINI in the last 168 hours. BNP (last 3 results) No results for input(s): PROBNP in the last 8760 hours. CBG: No results for input(s): GLUCAP in the last 168 hours. D-Dimer: No results for input(s): DDIMER in the last 72 hours. Hgb A1c: No results for input(s): HGBA1C in the last 72 hours. Lipid Profile: No results for input(s): CHOL, HDL, LDLCALC, TRIG, CHOLHDL, LDLDIRECT in the last 72 hours. Thyroid function studies: No results for input(s): TSH, T4TOTAL, T3FREE, THYROIDAB in the last 72 hours.  Invalid input(s): FREET3 Anemia work up: No results for input(s): VITAMINB12, FOLATE, FERRITIN, TIBC, IRON,  RETICCTPCT in the last 72 hours.  Sepsis Labs: Recent Labs  Lab 04/20/21 0420 04/21/21 0338 04/22/21 0152 04/23/21 0431  WBC 5.3 5.9 5.0 4.1    Microbiology Recent Results (from the past 240 hour(s))  Resp Panel by RT-PCR (Flu A&B, Covid) Nasopharyngeal Swab     Status: None   Collection Time: 04/17/21  3:40 PM   Specimen: Nasopharyngeal Swab; Nasopharyngeal(NP) swabs in vial transport  medium  Result Value Ref Range Status   SARS Coronavirus 2 by RT PCR NEGATIVE NEGATIVE Final    Comment: (NOTE) SARS-CoV-2 target nucleic acids are NOT DETECTED.  The SARS-CoV-2 RNA is generally detectable in upper respiratory specimens during the acute phase of infection. The lowest concentration of SARS-CoV-2 viral copies this assay can detect is 138 copies/mL. A negative result does not preclude SARS-Cov-2 infection and should not be used as the sole basis for treatment or other patient management decisions. A negative result may occur with  improper specimen collection/handling, submission of specimen other than nasopharyngeal swab, presence of viral mutation(s) within the areas targeted by this assay, and inadequate number of viral copies(<138 copies/mL). A negative result must be combined with clinical observations, patient history, and epidemiological information. The expected result is Negative.  Fact Sheet for Patients:  BloggerCourse.com  Fact Sheet for Healthcare Providers:  SeriousBroker.it  This test is no t yet approved or cleared by the [place] FDA and  has been authorized for detection and/or diagnosis of SARS-CoV-2 by FDA under an Emergency Use Authorization (EUA). This EUA will remain  in effect (meaning this test can be used) for the duration of the COVID-19 declaration under Section 564(b)(1) of the Act, 21 U.S.C.section 360bbb-3(b)(1), unless the authorization is terminated  or revoked sooner.       Influenza  A by PCR NEGATIVE NEGATIVE Final   Influenza B by PCR NEGATIVE NEGATIVE Final    Comment: (NOTE) The Xpert Xpress SARS-CoV-2/FLU/RSV plus assay is intended as an aid in the diagnosis of influenza from Nasopharyngeal swab specimens and should not be used as a sole basis for treatment. Nasal washings and aspirates are unacceptable for Xpert Xpress SARS-CoV-2/FLU/RSV testing.  Fact Sheet for Patients: BloggerCourse.com  Fact Sheet for Healthcare Providers: SeriousBroker.it  This test is not yet approved or cleared by the [place] FDA and has been authorized for detection and/or diagnosis of SARS-CoV-2 by FDA under an Emergency Use Authorization (EUA). This EUA will remain in effect (meaning this test can be used) for the duration of the COVID-19 declaration under Section 564(b)(1) of the Act, 21 U.S.C. section 360bbb-3(b)(1), unless the authorization is terminated or revoked.  Performed at [facility], [address], [place], [place] [address]     Procedures and diagnostic studies:  No results found.  Medications:    [START ON 04/24/2021] digoxin  0.125 mg Oral Daily   feeding supplement  237 mL Oral TID BM   Gerhardt's butt cream   Topical TID   mouth rinse  15 mL Mouth Rinse BID   metoprolol tartrate  25 mg Oral BID   mirtazapine  7.5 mg Oral QHS   multivitamin with minerals  1 tablet Oral Daily   nutrition supplement (JUVEN)  1 packet Oral BID BM   Continuous Infusions:     LOS: 6 days   Joseph Art  [facility]   How to contact the [facility] Attending or Consulting provider 7A - 7P or covering provider during after hours 7P -7A, for this patient?  Check the care team in [facility] and look for a) attending/consulting TR[facility] provider listed and b) the [facility] team listed Log into [email or web address] and use [place]'s universal password to access. If you do not have the password, please contact the hospital  operator. Locate the [facility] provider you are looking for under [facility] and page to a number that you can be directly reached. If you still have difficulty  reaching the provider, please page the [facility] (Director on Call) for the Hospitalists listed on amion for assistance.  04/23/2021, 3:56 PM

## 2021-04-23 NOTE — Progress Notes (Signed)
OT Cancellation Note  Patient Details Name: Kepler Mccabe MRN: [id number] DOB: 12/01/1940   Cancelled Treatment:    Reason Eval/Treat Not Completed: Medical issues which prohibited therapy Pt with red MEWS (4). Will hold therapy attempts and follow-up later today/tomorrow for OT session as pt medically appropriate.  Layla Maw 04/23/2021, 11:30 AM

## 2021-04-23 NOTE — Progress Notes (Addendum)
Calorie Count Note  48-hour calorie count ordered.  Diet: Dysphagia 3 with thin liquids (was on full liquids at time of last RD assessment) Supplements: Ensure Enlive TID   Estimated Nutritional Needs:  Kcal:  2200-2400 Protein:  100-115 grams Fluid:  >2.2 L  Day 2 Results (1/2 and 1/3): Breakfast: 309 kcal, 16 grams protein Lunch: 0 kcal, 0 grams protein Dinner: 0 kcal, 0 grams protein Supplements: 350 kcal, 20 grams protein (1 Ensure)  Total intake: 659 kcal (30% of minimum estimated needs)  36 grams of protein (36% of minimum estimated needs)  Nutrition Dx: Increased nutrient needs related to cancer and cancer related treatments as evidenced by estimated needs. - ongoing   Goal: Patient will meet greater than or equal to 90% of their needs. - not met PO  Pt not meeting needs by mouth, despite supplements. RD to recommend placement of feeding tube.  Intervention:  Discontinue calorie count. Recommend placement of G-tube for nutrition. Once G-tube is placed, initiate tube feeding via PEG: Osmolite 1.5 at 20 ml/h and increase by 10 ml every 12 hrs to goal rate of 60 ml/h (1440 ml per day) Prosource TF 45 ml BID Free water flushes of 200 ml q 4 hrs. Provides 2240 kcal, 113 gm protein, 2294 ml of total free water daily. Monitor magnesium, potassium, and phosphorus BID for at least 3 days, MD to replete as needed, as pt is at risk for refeeding syndrome. Discontinue Ensure TID. Discontinue MVI with minerals daily.  Derrel Nip, RD, LDN (she/her/hers) Clinical Inpatient Dietitian RD Pager/After-Hours/Weekend Pager # in [place]

## 2021-04-23 NOTE — Progress Notes (Signed)
PT Cancellation Note  Patient Details Name: Robert [name] MRN: [id number] DOB: December 15, 1940   Cancelled Treatment:    Reason Eval/Treat Not Completed: Medical issues which prohibited therapy. Patient with MEWS of 4 at this time. Will hold PT at this time and re-attempt at later time/day if appropriate.    Robert [name] 04/23/2021, 11:06 AM

## 2021-04-23 NOTE — TOC Progression Note (Signed)
Transition of Care [facility]) - Progression Note    Patient Details  Name: Robert [name] MRN: [id number] Date of Birth: 07/22/1940  Transition of Care [facility]) CM/SW [place], [place] Phone Number: 04/23/2021, 5:03 PM  Clinical Narrative:    CSW spoke with pt spouse about residential hospice, CSW provided pt with options and the family decided on [facility]. CSW reached out to [facility] for referral.    Expected Discharge Plan: [place] Barriers to Discharge: Continued Medical Work up  Expected Discharge Plan and Services Expected Discharge Plan: [place] arrangements for the past 2 months: Single Family Home                                       Social Determinants of Health (SDOH) Interventions    Readmission Risk Interventions No flowsheet data found.

## 2021-04-24 ENCOUNTER — Ambulatory Visit: Payer: Medicare Other | Admitting: Internal Medicine

## 2021-04-24 LAB — TYPE AND SCREEN
ABO/RH(D): O NEG
Antibody Screen: NEGATIVE
Unit division: 0

## 2021-04-24 LAB — BPAM RBC
Blood Product Expiration Date: 202301092359
ISSUE DATE / TIME: 202301031108
Unit Type and Rh: 9500

## 2021-04-24 NOTE — Progress Notes (Addendum)
Manufacturing engineer [facility]) Hospital Liaison note.      Received request from [place] for family interest in [facility]. Met with patient and wife at bedside to provide information and answer questions.  Patient information has been forwarded to [facility] for review, eligibility confirmed.  Hospital Liaison will follow up tomorrow or sooner if a room becomes available .  A Please do not hesitate to call with questions.      Thank you,      Farrel Gordon, RN, [place] (listed on [facility] under [place])       [phone]

## 2021-04-24 NOTE — Progress Notes (Signed)
OT Cancellation Note  Patient Details Name: Robert [name] MRN: [id number] DOB: November 24, 1940   Cancelled Treatment:    Reason Eval/Treat Not Completed: Other (comment) Noted per chart that pt/family decided to go comfort care, plans to hospice at facility. Confirmed with MD ok for therapy to sign off at acute level. Have not been able to progress OOB with therapy due to rising HR with minimal activity.   Layla Maw 04/24/2021, 8:53 AM

## 2021-04-24 NOTE — Progress Notes (Signed)
PT Cancellation Note  Patient Details Name: Robert [name] MRN: [id number] DOB: 1940-10-07   Cancelled Treatment:    Reason Eval/Treat Not Completed: Medical issues which prohibited therapy.  Noted per chart that pt/family decided to go comfort care, plans to hospice at facility. Confirmed with MD ok for therapy to sign off at acute level. Have not been able to progress OOB with therapy due to rising HR with minimal activity. PT will sign off.   Moishe Spice, PT, DPT Acute Rehabilitation Services  Pager: [phone] Office: [place] 04/24/2021, 9:06 AM

## 2021-04-24 NOTE — Progress Notes (Addendum)
Progress Note    Robert [name]  YNW:[id number] DOB: 23-Jul-1940  DOA: 04/17/2021 PCP: Farris Has, MD    Brief Narrative:    Medical records reviewed and are as summarized below:  Robert [name] is an 81 y.o. 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 fib on Xarelto, chronic diastolic CHF, severe mitral regurgitation, presented with rapid A. Fib and low hgb.  Patient's HR has been difficult to control.  Slow to improve. Many medical issues.  Now patient and family has decided on hospice.  ? Residential hospice appropriate.  TOC consult   Assessment/Plan:   A. fib with RVR -cards consult appreciated seen by cardiology earlier this admission, we felt his A. fib RVR was related to underlying hypermetabolic state from advanced malignancy, anemia etc. and that rate control would be of limited value in the setting of overall poor prognosis -Currently heart rate better on beta-blocker and low-dose digoxin -Oncology team following, seen by palliative care as well, now plan for residential hospice   Severe hypokalemia  Severe hypomagnesemia -Replaced, now comfort care   Hyponatremia   New onset of pancytopenia -Likely related to recent chemotherapy and immunotherapy. 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 yellow slough in wound bed---see [facility] (Active)  04/18/21 1550 (see WOCRN progress note)  Location: Sacrum  Location Orientation:   Staging: Unstageable - Full thickness tissue loss in which the base of the injury is covered by slough (yellow, tan, gray, green or brown) and/or eschar (tan, brown or black) in the wound bed. (see WOCRN progress note)  Wound Description (Comments): yellow slough in wound bed---see [facility] progress note for details and measurements  Present on Admission: Yes  Sacral decubitus -WOC consult -un-stage-able   -POA  DVT prophylaxis: scd Code Status: DNR Family Communication: Discussed with patient, no family at bedside Disposition Plan: Status is: Inpatient Residential hospice         Medical Consultants:   IR Oncology Cards Palliative care     Subjective:   -Feels okay, no events overnight plan for residential hospice when bed available  Objective:    Vitals:   04/23/21 1600 04/23/21 2032 04/24/21 0756 04/24/21 1107  BP: 119/60 (!) 107/56 117/63 103/67  Pulse: (!) 136 (!) 115 (!) 109 (!) 104  Resp: (!) 23 17    Temp:  98.4 F (36.9 C) 98.9 F (37.2 C) 98 F (36.7 C)  TempSrc:  Oral Oral Oral  SpO2: 94% 97% 97% 99%  Weight:      Height:        Intake/Output Summary (Last 24 hours) at 04/24/2021 1511 Last data filed at 04/24/2021 1105 Gross per 24 hour  Intake 655.52 ml  Output 1325 ml  Net -669.48 ml   Filed Weights   04/21/21 0418 04/22/21 0207 04/23/21  0011  Weight: 77.1 kg 77.8 kg 78.1 kg    Exam:  Gen: Chronically ill elderly male, sitting up in bed, AAO x2, no distress HEENT: Scars, 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 Lungs: Clear anteriorly, few conducted upper airway sounds CVS: S1S2/RRR Abd: soft, Non tender, non distended, BS present Extremities: No edema Skin: no new rashes on exposed skin           Data Reviewed:   I have personally reviewed following labs  and imaging studies:  Labs: Labs show the following:   Basic Metabolic Panel: Recent Labs  Lab 04/18/21 0322 04/19/21 0312 04/20/21 0420 04/21/21 0338 04/21/21 1543 04/22/21 0152 04/23/21 0431 04/23/21 1536  NA 128* 124* 124* 121* 123* 123* 123*  --   K 3.2* 3.6 3.7 3.8 3.8 4.6 3.8  --   CL 94* 93* 92* 87* 88* 88* 91*  --   CO2 24 25 25 25 26 27 24   --   GLUCOSE 88 104* 113* 109* 122* 116* 102*  --   BUN 13 10 10 14 13 16 11   --   CREATININE 0.51* 0.45* 0.48* 0.42* 0.48* 0.41* 0.33*  --   CALCIUM 7.3* 7.2* 7.3* 7.4* 7.9* 7.6* 7.3*  --   MG 1.6* 1.5* 1.7  --   --  1.3*  --  1.5*  PHOS  --  1.9* 2.7  --   --  3.2  --   --    GFR Estimated Creatinine Clearance: 83.5 mL/min (A) (by C-G formula based on SCr of 0.33 mg/dL (L)). Liver Function Tests: No results for input(s): AST, ALT, ALKPHOS, BILITOT, PROT, ALBUMIN in the last 168 hours.  No results for input(s): LIPASE, AMYLASE in the last 168 hours. No results for input(s): AMMONIA in the last 168 hours. Coagulation profile No results for input(s): INR, PROTIME in the last 168 hours.   CBC: Recent Labs  Lab 04/19/21 0312 04/20/21 0420 04/21/21 0338 04/22/21 0152 04/23/21 0431  WBC 4.6 5.3 5.9 5.0 4.1  HGB 8.0* 7.4* 7.9* 8.2* 7.0*  HCT 24.5* 22.6* 23.8* 24.3* 21.0*  MCV 86.0 85.6 86.2 86.5 86.4  PLT 67* 65* 68* 79* 86*   Cardiac Enzymes: No results for input(s): CKTOTAL, CKMB, CKMBINDEX, TROPONINI in the last 168 hours. BNP (last 3 results) No results for input(s): PROBNP in the last 8760 hours. CBG: No results for input(s): GLUCAP in the last 168 hours. D-Dimer: No results for input(s): DDIMER in the last 72 hours. Hgb A1c: No results for input(s): HGBA1C in the last 72 hours. Lipid Profile: No results for input(s): CHOL, HDL, LDLCALC, TRIG, CHOLHDL, LDLDIRECT in the last 72 hours. Thyroid function studies: No results for input(s): TSH, T4TOTAL, T3FREE, THYROIDAB in the last 72 hours.  Invalid input(s):  FREET3 Anemia work up: No results for input(s): VITAMINB12, FOLATE, FERRITIN, TIBC, IRON, RETICCTPCT in the last 72 hours.  Sepsis Labs: Recent Labs  Lab 04/20/21 0420 04/21/21 0338 04/22/21 0152 04/23/21 0431  WBC 5.3 5.9 5.0 4.1    Microbiology Recent Results (from the past 240 hour(s))  Resp Panel by RT-PCR (Flu A&B, Covid) Nasopharyngeal Swab     Status: None   Collection Time: 04/17/21  3:40 PM   Specimen: Nasopharyngeal Swab; Nasopharyngeal(NP) swabs in vial transport medium  Result Value Ref Range Status   SARS Coronavirus 2 by RT PCR NEGATIVE NEGATIVE Final    Comment: (NOTE) SARS-CoV-2 target nucleic acids are NOT DETECTED.  The SARS-CoV-2 RNA is generally detectable in upper respiratory specimens  during the acute phase of infection. The lowest concentration of SARS-CoV-2 viral copies this assay can detect is 138 copies/mL. A negative result does not preclude SARS-Cov-2 infection and should not be used as the sole basis for treatment or other patient management decisions. A negative result may occur with  improper specimen collection/handling, submission of specimen other than nasopharyngeal swab, presence of viral mutation(s) within the areas targeted by this assay, and inadequate number of viral copies(<138 copies/mL). A negative result must be combined with clinical observations, patient history, and epidemiological information. The expected result is Negative.  Fact Sheet for Patients:  BloggerCourse.com  Fact Sheet for Healthcare Providers:  SeriousBroker.it  This test is no t yet approved or cleared by the [place] FDA and  has been authorized for detection and/or diagnosis of SARS-CoV-2 by FDA under an Emergency Use Authorization (EUA). This EUA will remain  in effect (meaning this test can be used) for the duration of the COVID-19 declaration under Section 564(b)(1) of the Act, 21 U.S.C.section  360bbb-3(b)(1), unless the authorization is terminated  or revoked sooner.       Influenza A by PCR NEGATIVE NEGATIVE Final   Influenza B by PCR NEGATIVE NEGATIVE Final    Comment: (NOTE) The Xpert Xpress SARS-CoV-2/FLU/RSV plus assay is intended as an aid in the diagnosis of influenza from Nasopharyngeal swab specimens and should not be used as a sole basis for treatment. Nasal washings and aspirates are unacceptable for Xpert Xpress SARS-CoV-2/FLU/RSV testing.  Fact Sheet for Patients: BloggerCourse.com  Fact Sheet for Healthcare Providers: SeriousBroker.it  This test is not yet approved or cleared by the [place] FDA and has been authorized for detection and/or diagnosis of SARS-CoV-2 by FDA under an Emergency Use Authorization (EUA). This EUA will remain in effect (meaning this test can be used) for the duration of the COVID-19 declaration under Section 564(b)(1) of the Act, 21 U.S.C. section 360bbb-3(b)(1), unless the authorization is terminated or revoked.  Performed at [facility], [address] [place], [place] [address]     Procedures and diagnostic studies:  No results found.  Medications:    digoxin  0.125 mg Oral Daily   feeding supplement  237 mL Oral TID BM   Gerhardt's butt cream   Topical TID   mouth rinse  15 mL Mouth Rinse BID   metoprolol tartrate  25 mg Oral BID   mirtazapine  7.5 mg Oral QHS   multivitamin with minerals  1 tablet Oral Daily   nutrition supplement (JUVEN)  1 packet Oral BID BM   Continuous Infusions:     LOS: 7 days   Zannie Cove  [facility]  04/24/2021, 3:11 PM

## 2021-04-25 DIAGNOSIS — I4891 Unspecified atrial fibrillation: Secondary | ICD-10-CM | POA: Diagnosis not present

## 2021-04-25 MED ORDER — CHLORHEXIDINE GLUCONATE 0.12 % MT SOLN
15.0000 mL | Freq: Two times a day (BID) | OROMUCOSAL | Status: DC
  Administered 2021-04-25 – 2021-04-26 (×4): 15 mL via OROMUCOSAL
  Filled 2021-04-25 (×4): qty 15

## 2021-04-25 NOTE — Progress Notes (Signed)
[facility]) Hospital Liaison note     [place] is unable to offer a room today. Hospital Liaison will follow up tomorrow or sooner if a room becomes available. Please do not hesitate to call with questions.       Thank you for the opportunity to participate in this patient's care.     [facility] Liaison [phone]

## 2021-04-25 NOTE — Care Management Important Message (Signed)
Important Message  Patient Details  Name: Robert [name] MRN: [id number] Date of Birth: 13-Oct-1940   Medicare Important Message Given:  Yes     Shelda Altes 04/25/2021, 7:11 AM

## 2021-04-25 NOTE — Progress Notes (Signed)
Patient seen and examined, resting comfortably, p.o. intake fair -Heart rate remains poorly controlled -Remains stable, awaiting residential hospice  Domenic Polite, MD

## 2021-04-25 NOTE — TOC Progression Note (Signed)
Transition of Care [facility]) - Progression Note    Patient Details  Name: Gearl Baratta MRN: [id number] Date of Birth: 11-07-1940  Transition of Care [facility]) CM/SW Contact  Zenon Mayo, RN Phone Number: 04/25/2021, 9:20 AM  Clinical Narrative:    Per Lenna Sciara with [facility] they have no beds today at [facility].   Expected Discharge Plan: [place] Barriers to Discharge: Continued Medical Work up  Expected Discharge Plan and Services Expected Discharge Plan: [place] arrangements for the past 2 months: Single Family Home                                       Social Determinants of Health (SDOH) Interventions    Readmission Risk Interventions No flowsheet data found.

## 2021-04-25 NOTE — Progress Notes (Signed)
Nutrition Brief Note ? ?Chart reviewed. ?Pt now transitioning to comfort care.  ?No further nutrition interventions planned at this time.  ?Please re-consult as needed.  ? ?Cate Zala Degrasse MS, RDN, LDN, CNSC ?Registered Dietitian III ?Clinical Nutrition ?RD Pager and On-Call Pager Number Located in Amion  ? ? ?

## 2021-04-26 DIAGNOSIS — Z66 Do not resuscitate: Secondary | ICD-10-CM

## 2021-04-26 MED ORDER — NAPHAZOLINE-GLYCERIN 0.012-0.25 % OP SOLN
1.0000 [drp] | Freq: Four times a day (QID) | OPHTHALMIC | Status: DC | PRN
  Administered 2021-04-26: 2 [drp] via OPHTHALMIC
  Filled 2021-04-26: qty 15

## 2021-04-26 MED ORDER — MORPHINE SULFATE (CONCENTRATE) 10 MG/0.5ML PO SOLN: 5.0000 mg | ORAL | 0 refills | Status: DC | PRN

## 2021-04-26 MED ORDER — LORAZEPAM 1 MG PO TABS: 1.0000 mg | ORAL_TABLET | ORAL | 0 refills | Status: DC | PRN

## 2021-04-26 NOTE — Progress Notes (Signed)
Report given to RN at [facility].

## 2021-04-26 NOTE — Progress Notes (Addendum)
[place] [facility]) Hospital Liaison note     [place] is unable to offer a room today. Hospital Liaison will follow up tomorrow or sooner if a room becomes available. Please do not hesitate to call with questions.     Addendum: Bed is available at [facility]. Hospital IDT have been notified as well as family. Consents are to be completed at/around 2:30p. Once they are complete, PTAR to be arranged for transport.    Please send signed DNR form with patient and RN call report to [phone].      Thank you for the opportunity to participate in this patient's care.   Daphene Calamity, MSW [facility] Liaison [phone]

## 2021-04-26 NOTE — TOC Progression Note (Addendum)
Transition of Care [facility]) - Progression Note    Patient Details  Name: Robert [name] MRN: [id number] Date of Birth: Sep 27, 1940  Transition of Care [facility]) CM/SW Contact  Zenon Mayo, RN Phone Number: 04/26/2021, 9:32 AM  Clinical Narrative:    NCM spoke with Lorayne Bender, she states she will check to see if they have a bed and call this NCM back.  NCM received returned call , per [facility] they do  not have a bed available today.   Expected Discharge Plan: [place] Barriers to Discharge: Continued Medical Work up  Expected Discharge Plan and Services Expected Discharge Plan: [place] arrangements for the past 2 months: Single Family Home                                       Social Determinants of Health (SDOH) Interventions    Readmission Risk Interventions No flowsheet data found.

## 2021-04-26 NOTE — Progress Notes (Addendum)
Daily Progress Note   Patient Name: Robert [name]       Date: 04/26/2021 DOB: 03/21/1941  Age: 81 y.o. MRN#: [id number] Attending Physician: Zannie Cove, MD Primary Care Physician: Farris Has, MD Admit Date: 04/17/2021   HPI/Patient Profile: 81 y.o. 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    Head and neck squamous cell carcinoma initially diagnosed in 2019, status post resection. Recurrence in May 2022. He underwent radiation therapy June-August 2022. In October 2022, was found to have metastatic lesions in lung and mediastinal lymph nodes as well as multiple suspicious lever lesions and osseous lesions. He started chemotherapy and immunotherapy in November, with most recent treatment 04/08/21.    Patient has recently had difficulty swallowing solid food and he has been losing weight. Oncology had sent him to IR for PEG placement. This was initially attempted 04/10/21, but patient was found to be in a-fib with RVR so procedure was aborted. It was re-attempted 12/28, but patient was again found to be in a-fib with RVR.    Subjective: Chart reviewed. Note that patient is awaiting transfer to [facility]. He is alert with no acute complaints other than "laying in the bed". Reports continued poor PO intake.   Daughter is at bedside. Patient is needing to use the urinal and asks for privacy, so we step outside the room. We briefly reviewed patient's current illness and hospital course. I provided education on the natural disease trajectory of advanced cancer, emphasizing that functional  status is generally preserved until late in the disease course, followed by a precipitous decline over weeks to months. Discussed that the onset of decline is often consistent with approaching EOL.  Daughter expresses some frustration that patient has been in the hospital for 9 days and feels that his poor prognosis was not clearly explained by medical staff. However, she is glad that he is finally going to [facility]. Discussed that this would be a more peaceful setting for him and family.   I asked daughter if patient seemed to be aware that he is approaching EOL. She replies that she thinks so, but is not sure. Created space and opportunity  for daughter to express thoughts and feelings regarding patient's current medical situation. Emotional support provided.      Length of Stay: 9   Physical Exam Vitals reviewed.  Constitutional:      General: He is not in acute distress.    Appearance: He is ill-appearing.  Pulmonary:     Effort: Pulmonary effort is normal.  Neurological:     Mental Status: He is alert.     Motor: Weakness present.            Vital Signs: BP 107/65 (BP Location: Right Arm)    Pulse 96    Temp 98.9 F (37.2 C) (Oral)    Resp 18    Ht 6' (1.829 m)    Wt 78.1 kg    SpO2 99%    BMI 23.35 kg/m  SpO2: SpO2: 99 % O2 Device: O2 Device: Room Air O2 Flow Rate:         Palliative Assessment/Data: PPS 20%      Palliative Care Assessment & Plan   Assessment: - Afib with RVR - Severe hypokalemia - severe hypomagnesemia - hyponatremia - new onset pancytopenia - chronic diastolic CHF - Head and neck metastatic squamous cell carcinoma - failure to thrive  Recommendations/Plan: Continue comfort focused care Pending transfer to [facility] this evening   Symptom Management:  Morphine prn for pain or dyspnea Lorazepam (ATIVAN) prn for anxiety  Code Status: DNR/DNI  Prognosis:  < 2 weeks  Discharge Planning: Hospice facility    Thank you for  allowing the Palliative Medicine Team to assist in the care of this patient.   MDM - High due to: 1 or more chronic illnesses with severe exacerbation, progression, or side effects of treatment OR acute or chronic illness or injury that poses a threat to life or bodily function Review of prior external notes, review of test results, assessment requiring an independent historian Decision not to resuscitate or to de-escalate care because poor prognosis       Merry Proud, NP  Please contact Palliative Medicine Team phone at [phone] for questions and concerns.

## 2021-04-26 NOTE — Progress Notes (Signed)
Pt refusing CPAP tonight.

## 2021-04-26 NOTE — TOC Transition Note (Addendum)
Transition of Care [facility]) - CM/SW Discharge Note   Patient Details  Name: Robert [name] MRN: [id number] Date of Birth: [birth year]  Transition [facility]) CM/SW Contact:  Zenon Mayo, RN Phone Number: 04/26/2021, 3:12 PM   Clinical Narrative:    Per Lorayne Bender they have a bed at [facility] for patient today, she will let this NCM know when to call PTAR for transport.  Awaitinng for patient to be accepted by MD. Lorayne Bender states they still do not have consent, sometimes families has lots of questions. She states she will call PTAR when they receive the consent, Lorayne Bender phone is [phone].   Final next level of care: [place] Barriers to Discharge: No Barriers Identified   Patient Goals and CMS Choice Patient states their goals for this hospitalization and ongoing recovery are:: hospice      Discharge Placement                       Discharge Plan and Services                  DME Agency: NA                  Social Determinants of Health (SDOH) Interventions     Readmission Risk Interventions No flowsheet data found.

## 2021-04-26 NOTE — Discharge Summary (Signed)
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 fib on Xarelto, chronic diastolic CHF, severe mitral regurgitation, presented with rapid A. Fib and low hgb.  Patient's HR has been difficult to control  Hospital Course:   A. fib with RVR -seen by cardiology earlier this admission, we felt his A. fib RVR was related to underlying hypermetabolic state from advanced stage IV malignancy, anemia etc. and that rate control would be of limited value in the setting of overall poor prognosis -Currently heart rate better -Oncology team following, seen by palliative care as well,  -Transitioned to comfort care, discharged to residential hospice for comfort focused care   Severe hypokalemia  Severe hypomagnesemia -Replaced, now comfort care   Hyponatremia   New onset of pancytopenia -Likely related to recent chemotherapy and immunotherapy. -oncology consult appreciated -s/p 3 units PRBC, discontinued labs, comfort care   Chronic diastolic CHF -holding lasix due to hypotension -high risk of 3rd spacing with poor PO nutrition -Now comfort care, plan for residential  hospice   Head and neck metastatic squamous cell carcinoma -poor overall prognosis -Oncology consulted, not felt to be a good candidate for further therapy, seen by palliative care in consultation as well -Now comfort care   Adult failure to thrive Moderate protein calorie malnutrition   Sacral decubitus wounds Pressure Injury 04/18/21 Sacrum Unstageable - Full thickness tissue loss in which the base of the injury is covered by slough (yellow, tan, gray, green or brown) and/or eschar (tan, brown or black) in the wound bed. yellow slough in wound bed---see [facility] (Active)  04/18/21 1550 (see [facility]N progress note)  Location: Sacrum  Location Orientation:   Staging: Unstageable - Full thickness tissue loss in which the base of the injury is covered by slough (yellow, tan, gray, green or brown) and/or eschar (tan, brown or black) in the wound bed. (see [facility]N progress note)  Wound Description (Comments): yellow slough in wound bed---see [facility] progress note for details and measurements  Present on Admission: Yes   Discharge Exam: Vitals:   04/26/21 0951 04/26/21 1203  BP: 96/74 107/65  Pulse: 98 96  Resp: 16 18  Temp: 99.2 F (37.3 C) 98.9 F (37.2 C)  SpO2:     Gen: Chronically ill male, somnolent but easily arousable, AAOx3, no distress HEENT: Scars, jaw disfigurement from surgery, XRT  Lungs: Clear anteriorly, few conducted upper airway sounds CVS: S1-S2, regular rate rhythm Abdomen: Soft, nontender, bowel sounds present Extremities: No edema   Discharge Instructions   Discharge Instructions     Discharge wound care:   Complete by: As directed    routine   Increase activity slowly   Complete by: As directed       Allergies as of  04/26/2021   No Known Allergies      Medication List     STOP taking these medications    furosemide 20 MG tablet Commonly known as: Lasix   Molli Posey Standard 1.4 Liqd   metoprolol tartrate 100 MG tablet Commonly known as:  LOPRESSOR   rivaroxaban 20 MG Tabs tablet Commonly known as: Xarelto       TAKE these medications    acetaminophen 160 MG/5ML suspension Commonly known as: TYLENOL Take 30 mLs (960 mg total) by mouth every 6 (six) hours as needed for moderate pain.   docusate sodium 100 MG capsule Commonly known as: COLACE Take 100 mg by mouth daily as needed for mild constipation.   LORazepam 1 MG tablet Commonly known as: ATIVAN Take 1 tablet (1 mg total) by mouth every 4 (four) hours as needed for anxiety.   morphine CONCENTRATE 10 MG/0.5ML Soln concentrated solution Take 0.25 mLs (5 mg total) by mouth every 4 (four) hours as needed for moderate pain (or dyspnea).   prochlorperazine 10 MG tablet Commonly known as: COMPAZINE Take 1 tablet (10 mg total) by mouth every 6 (six) hours as needed. What changed: reasons to take this               Discharge Care Instructions  (From admission, onward)           Start     Ordered   04/26/21 0000  Discharge wound care:       Comments: routine   04/26/21 1410           No Known Allergies    The results of significant diagnostics from this hospitalization (including imaging, microbiology, ancillary and laboratory) are listed below for reference.    Significant Diagnostic Studies: IR Fluoro Rm 30-60 Min  Result Date: 04/10/2021 CLINICAL DATA:  81 year old male with history of metastatic squamous cell carcinoma. Percutaneous gastrostomy tube requested for supplemental nutrition. EXAM: IR FLUORO RM 0-60 MIN ANESTHESIA/SEDATION: None. MEDICATIONS: None. CONTRAST:  None. PROCEDURE: None. COMPLICATIONS: None immediate FINDINGS: Immediately prior to procedure, the patient was noted to be in atrial fibrillation with rapid ventricular response. Systolic blood pressures were in the 90s, similar to his recorded baseline. The patient was asymptomatic. Patient was given 500 mL normal saline bolus without change in atrial fibrillation. An EKG  was obtained. The patient was then transferred to the emergency department for further evaluation. IMPRESSION: Gastrostomy tube placement aborted due to new onset atrial fibrillation with RVR. The patient was transferred to the emergency department in stable condition. PLAN: Gastrostomy tube placement on an outpatient basis can be rescheduled at any time. Marliss Coots, MD [facility] Radiology Electronically Signed   By: Marliss Coots M.D.   On: 04/10/2021 16:57   DG Chest Portable 1 View  Result Date: 04/17/2021 CLINICAL DATA:  Malnutrition. EXAM: PORTABLE CHEST 1 VIEW COMPARISON:  One view chest x-ray 04/10/2021 FINDINGS: The heart is enlarged. Mild edema again noted. Small effusions are present. Patchy airspace opacities are again noted in the right mid lung and left lower lobe. IMPRESSION: 1. Stable cardiomegaly and mild edema. 2. Small bilateral pleural effusions. 3. Patchy bilateral airspace disease likely reflects atelectasis. Infection is not excluded. Electronically Signed   By: Marin Roberts M.D.   On: 04/17/2021 12:58   DG Chest Port 1 View  Result Date: 04/10/2021 CLINICAL DATA:  atrial fib.  Hypertension EXAM: PORTABLE CHEST 1 VIEW COMPARISON:  CT chest 02/09/2021, CT PET 03/04/2021 FINDINGS:  The heart and mediastinal contours are within normal limits. Low lung volumes. Biapical pleural/pulmonary scarring. Patchy airspace opacity and nodularities consistent with known non-small cell lung cancer question 1 cm cavitary lesion at the right base. No pulmonary edema. Blunting of bilateral costophrenic angles likely representing trace pleural effusions. No pneumothorax. No acute osseous abnormality. IMPRESSION: 1. Low lung volumes with patchy airspace opacity and nodularities consistent with known non-small cell lung cancer. Superimposed infection/inflammation not excluded. 2. Question 1 cm cavitary lesion at the right base. Electronically  Signed   By: Tish Frederickson M.D.   On: 04/10/2021 15:40    Microbiology: Recent Results (from the past 240 hour(s))  Resp Panel by RT-PCR (Flu A&B, Covid) Nasopharyngeal Swab     Status: None   Collection Time: 04/17/21  3:40 PM   Specimen: Nasopharyngeal Swab; Nasopharyngeal(NP) swabs in vial transport medium  Result Value Ref Range Status   SARS Coronavirus 2 by RT PCR NEGATIVE NEGATIVE Final    Comment: (NOTE) SARS-CoV-2 target nucleic acids are NOT DETECTED.  The SARS-CoV-2 RNA is generally detectable in upper respiratory specimens during the acute phase of infection. The lowest concentration of SARS-CoV-2 viral copies this assay can detect is 138 copies/mL. A negative result does not preclude SARS-Cov-2 infection and should not be used as the sole basis for treatment or other patient management decisions. A negative result may occur with  improper specimen collection/handling, submission of specimen other than nasopharyngeal swab, presence of viral mutation(s) within the areas targeted by this assay, and inadequate number of viral copies(<138 copies/mL). A negative result must be combined with clinical observations, patient history, and epidemiological information. The expected result is Negative.  Fact Sheet for Patients:  BloggerCourse.com  Fact Sheet for Healthcare Providers:  SeriousBroker.it  This test is no t yet approved or cleared by the [place] FDA and  has been authorized for detection and/or diagnosis of SARS-CoV-2 by FDA under an Emergency Use Authorization (EUA). This EUA will remain  in effect (meaning this test can be used) for the duration of the COVID-19 declaration under Section 564(b)(1) of the Act, 21 U.S.C.section 360bbb-3(b)(1), unless the authorization is terminated  or revoked sooner.       Influenza A by PCR NEGATIVE NEGATIVE Final   Influenza B by PCR NEGATIVE NEGATIVE Final    Comment:  (NOTE) The Xpert Xpress SARS-CoV-2/FLU/RSV plus assay is intended as an aid in the diagnosis of influenza from Nasopharyngeal swab specimens and should not be used as a sole basis for treatment. Nasal washings and aspirates are unacceptable for Xpert Xpress SARS-CoV-2/FLU/RSV testing.  Fact Sheet for Patients: BloggerCourse.com  Fact Sheet for Healthcare Providers: SeriousBroker.it  This test is not yet approved or cleared by the [place] FDA and has been authorized for detection and/or diagnosis of SARS-CoV-2 by FDA under an Emergency Use Authorization (EUA). This EUA will remain in effect (meaning this test can be used) for the duration of the COVID-19 declaration under Section 564(b)(1) of the Act, 21 U.S.C. section 360bbb-3(b)(1), unless the authorization is terminated or revoked.  Performed at [facility], [address], [place], [place] [address]      Labs: Basic Metabolic Panel: Recent Labs  Lab 04/20/21 0420 04/21/21 0338 04/21/21 1543 04/22/21 0152 04/23/21 0431 04/23/21 1536  NA 124* 121* 123* 123* 123*  --   K 3.7 3.8 3.8 4.6 3.8  --   CL 92* 87* 88* 88* 91*  --   CO2 25 25 26 27 24   --  GLUCOSE 113* 109* 122* 116* 102*  --   BUN 10 14 13 16 11   --   CREATININE 0.48* 0.42* 0.48* 0.41* 0.33*  --   CALCIUM 7.3* 7.4* 7.9* 7.6* 7.3*  --   MG 1.7  --   --  1.3*  --  1.5*  PHOS 2.7  --   --  3.2  --   --    Liver Function Tests: No results for input(s): AST, ALT, ALKPHOS, BILITOT, PROT, ALBUMIN in the last 168 hours. No results for input(s): LIPASE, AMYLASE in the last 168 hours. No results for input(s): AMMONIA in the last 168 hours. CBC: Recent Labs  Lab 04/20/21 0420 04/21/21 0338 04/22/21 0152 04/23/21 0431  WBC 5.3 5.9 5.0 4.1  HGB 7.4* 7.9* 8.2* 7.0*  HCT 22.6* 23.8* 24.3* 21.0*  MCV 85.6 86.2 86.5 86.4  PLT 65* 68* 79* 86*   Cardiac Enzymes: No results for input(s): CKTOTAL,  CKMB, CKMBINDEX, TROPONINI in the last 168 hours. BNP: BNP (last 3 results) No results for input(s): BNP in the last 8760 hours.  ProBNP (last 3 results) No results for input(s): PROBNP in the last 8760 hours.  CBG: No results for input(s): GLUCAP in the last 168 hours.     Signed:  Zannie Cove MD.  [facility] 04/26/2021, 2:10 PM

## 2021-04-26 NOTE — Progress Notes (Signed)
Progress Note    Robert [name]  WUJ:[id number] DOB: [birth year]  DOA: 04/17/2021 PCP: Farris Has, MD    Brief Narrative:    Medical records reviewed and are as summarized below:  Robert [name] is an 81 y.o. 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 fib on Xarelto, chronic diastolic CHF, severe mitral regurgitation, presented with rapid A. Fib and low hgb.  Patient's HR has been difficult to control.  Slow to improve. Many medical issues.  Now patient and family has decided on hospice.  ? Residential hospice appropriate.  TOC consult   Assessment/Plan:   A. fib with RVR -seen by cardiology earlier this admission, we felt his A. fib RVR was related to underlying hypermetabolic state from advanced malignancy, anemia etc. and that rate control would be of limited value in the setting of overall poor prognosis -Currently heart rate better on beta-blocker and low-dose digoxin -Oncology team following, seen by palliative care as well,  -Transitioned to comfort care, now stable awaiting residential hospice   Severe hypokalemia  Severe hypomagnesemia -Replaced, now comfort care   Hyponatremia   New onset of pancytopenia -Likely related to recent chemotherapy and immunotherapy. -oncology consult appreciated -s/p 3 units PRBC, discontinue labs, comfort care   Chronic diastolic CHF -holding lasix due to hypotension -high risk of 3rd spacing with poor PO nutrition -Now comfort care, plan for residential hospice   Head and neck metastatic squamous cell carcinoma -poor overall prognosis -Oncology following, not felt to be a good candidate for further therapy -Now comfort care   Adult failure to thrive Moderate protein calorie malnutrition   Pressure Injury 04/18/21 Sacrum Unstageable - Full thickness tissue loss in which the base of the injury is covered by slough  (yellow, tan, gray, green or brown) and/or eschar (tan, brown or black) in the wound bed. yellow slough in wound bed---see [facility] (Active)  04/18/21 1550 (see [facility] progress note)  Location: Sacrum  Location Orientation:   Staging: Unstageable - Full thickness tissue loss in which the base of the injury is covered by slough (yellow, tan, gray, green or brown) and/or eschar (tan, brown or black) in the wound bed. (see [facility] progress note)  Wound Description (Comments): yellow slough in wound bed---see [facility] progress note for details and measurements  Present on Admission: Yes  Sacral decubitus -WOC consult -un-stage-able   -POA  DVT prophylaxis: scd Code Status: DNR Family Communication:  no family at bedside Disposition Plan: Status is: Inpatient Residential hospice   Medical Consultants:   IR Oncology Cards Palliative care     Subjective:   -Sleeping comfortably, I did not wake him up  Objective:    Vitals:   04/25/21 0804 04/25/21 1122 04/25/21 1952 04/26/21 0951  BP: (!) 111/57 99/62 94/62  96/74  Pulse: (!) 119 96 (!) 103 98  Resp:   18 16  Temp: 98.2 F (36.8 C)  98.6 F (37 C) 99.2 F (37.3 C)  TempSrc: Oral  Oral Oral  SpO2: 99% 99% 99%   Weight:      Height:        Intake/Output Summary (Last 24 hours) at 04/26/2021 1134 Last data filed at 04/26/2021 0918 Gross per 24 hour  Intake 1200 ml  Output 2025 ml  Net -825 ml   Filed Weights   04/21/21 0418 04/22/21 0207 04/23/21 0011  Weight: 77.1 kg 77.8 kg 78.1 kg  Exam:  Gen: Chronically ill male, somnolent but easily arousable, AAOx3, no distress HEENT: Scars, jaw disfigurement from surgery, XRT  Lungs: Clear anteriorly, few conducted upper airway sounds CVS: S1-S2, regular rate rhythm Abdomen: Soft, nontender, bowel sounds present Extremities: No edema       Data Reviewed:   I have personally reviewed following labs and imaging studies:  Labs: Labs show the following:   Basic Metabolic  Panel: Recent Labs  Lab 04/20/21 0420 04/21/21 0338 04/21/21 1543 04/22/21 0152 04/23/21 0431 04/23/21 1536  NA 124* 121* 123* 123* 123*  --   K 3.7 3.8 3.8 4.6 3.8  --   CL 92* 87* 88* 88* 91*  --   CO2 25 25 26 27 24   --   GLUCOSE 113* 109* 122* 116* 102*  --   BUN 10 14 13 16 11   --   CREATININE 0.48* 0.42* 0.48* 0.41* 0.33*  --   CALCIUM 7.3* 7.4* 7.9* 7.6* 7.3*  --   MG 1.7  --   --  1.3*  --  1.5*  PHOS 2.7  --   --  3.2  --   --    GFR Estimated Creatinine Clearance: 83.5 mL/min (A) (by C-G formula based on SCr of 0.33 mg/dL (L)). Liver Function Tests: No results for input(s): AST, ALT, ALKPHOS, BILITOT, PROT, ALBUMIN in the last 168 hours.  No results for input(s): LIPASE, AMYLASE in the last 168 hours. No results for input(s): AMMONIA in the last 168 hours. Coagulation profile No results for input(s): INR, PROTIME in the last 168 hours.   CBC: Recent Labs  Lab 04/20/21 0420 04/21/21 0338 04/22/21 0152 04/23/21 0431  WBC 5.3 5.9 5.0 4.1  HGB 7.4* 7.9* 8.2* 7.0*  HCT 22.6* 23.8* 24.3* 21.0*  MCV 85.6 86.2 86.5 86.4  PLT 65* 68* 79* 86*   Cardiac Enzymes: No results for input(s): CKTOTAL, CKMB, CKMBINDEX, TROPONINI in the last 168 hours. BNP (last 3 results) No results for input(s): PROBNP in the last 8760 hours. CBG: No results for input(s): GLUCAP in the last 168 hours. D-Dimer: No results for input(s): DDIMER in the last 72 hours. Hgb A1c: No results for input(s): HGBA1C in the last 72 hours. Lipid Profile: No results for input(s): CHOL, HDL, LDLCALC, TRIG, CHOLHDL, LDLDIRECT in the last 72 hours. Thyroid function studies: No results for input(s): TSH, T4TOTAL, T3FREE, THYROIDAB in the last 72 hours.  Invalid input(s): FREET3 Anemia work up: No results for input(s): VITAMINB12, FOLATE, FERRITIN, TIBC, IRON, RETICCTPCT in the last 72 hours.  Sepsis Labs: Recent Labs  Lab 04/20/21 0420 04/21/21 0338 04/22/21 0152 04/23/21 0431  WBC 5.3 5.9  5.0 4.1    Microbiology Recent Results (from the past 240 hour(s))  Resp Panel by RT-PCR (Flu A&B, Covid) Nasopharyngeal Swab     Status: None   Collection Time: 04/17/21  3:40 PM   Specimen: Nasopharyngeal Swab; Nasopharyngeal(NP) swabs in vial transport medium  Result Value Ref Range Status   SARS Coronavirus 2 by RT PCR NEGATIVE NEGATIVE Final    Comment: (NOTE) SARS-CoV-2 target nucleic acids are NOT DETECTED.  The SARS-CoV-2 RNA is generally detectable in upper respiratory specimens during the acute phase of infection. The lowest concentration of SARS-CoV-2 viral copies this assay can detect is 138 copies/mL. A negative result does not preclude SARS-Cov-2 infection and should not be used as the sole basis for treatment or other patient management decisions. A negative result may occur with  improper specimen collection/handling, submission  of specimen other than nasopharyngeal swab, presence of viral mutation(s) within the areas targeted by this assay, and inadequate number of viral copies(<138 copies/mL). A negative result must be combined with clinical observations, patient history, and epidemiological information. The expected result is Negative.  Fact Sheet for Patients:  BloggerCourse.com  Fact Sheet for Healthcare Providers:  SeriousBroker.it  This test is no t yet approved or cleared by the [place] FDA and  has been authorized for detection and/or diagnosis of SARS-CoV-2 by FDA under an Emergency Use Authorization (EUA). This EUA will remain  in effect (meaning this test can be used) for the duration of the COVID-19 declaration under Section 564(b)(1) of the Act, 21 U.S.C.section 360bbb-3(b)(1), unless the authorization is terminated  or revoked sooner.       Influenza A by PCR NEGATIVE NEGATIVE Final   Influenza B by PCR NEGATIVE NEGATIVE Final    Comment: (NOTE) The Xpert Xpress SARS-CoV-2/FLU/RSV plus  assay is intended as an aid in the diagnosis of influenza from Nasopharyngeal swab specimens and should not be used as a sole basis for treatment. Nasal washings and aspirates are unacceptable for Xpert Xpress SARS-CoV-2/FLU/RSV testing.  Fact Sheet for Patients: BloggerCourse.com  Fact Sheet for Healthcare Providers: SeriousBroker.it  This test is not yet approved or cleared by the [place] FDA and has been authorized for detection and/or diagnosis of SARS-CoV-2 by FDA under an Emergency Use Authorization (EUA). This EUA will remain in effect (meaning this test can be used) for the duration of the COVID-19 declaration under Section 564(b)(1) of the Act, 21 U.S.C. section 360bbb-3(b)(1), unless the authorization is terminated or revoked.  Performed at [facility], [address] [place], [place] [address]     Procedures and diagnostic studies:  No results found.  Medications:    chlorhexidine  15 mL Mouth Rinse BID   digoxin  0.125 mg Oral Daily   feeding supplement  237 mL Oral TID BM   Gerhardt's butt cream   Topical TID   mouth rinse  15 mL Mouth Rinse BID   metoprolol tartrate  25 mg Oral BID   mirtazapine  7.5 mg Oral QHS   multivitamin with minerals  1 tablet Oral Daily   nutrition supplement (JUVEN)  1 packet Oral BID BM   Continuous Infusions:     LOS: 9 days   Zannie Cove  [facility]  04/26/2021, 11:34 AM

## 2021-04-29 ENCOUNTER — Other Ambulatory Visit: Payer: Medicare Other

## 2021-04-29 ENCOUNTER — Encounter: Payer: Medicare Other | Admitting: Nurse Practitioner

## 2021-04-29 ENCOUNTER — Encounter: Payer: Medicare Other | Admitting: Nutrition

## 2021-04-29 ENCOUNTER — Ambulatory Visit: Payer: Medicare Other | Admitting: Physician Assistant

## 2021-04-29 ENCOUNTER — Ambulatory Visit: Payer: Medicare Other

## 2021-05-01 ENCOUNTER — Ambulatory Visit: Payer: Medicare Other

## 2021-05-02 ENCOUNTER — Other Ambulatory Visit: Payer: Medicare Other

## 2021-05-09 ENCOUNTER — Telehealth: Payer: Self-pay

## 2021-05-09 ENCOUNTER — Encounter: Payer: Self-pay | Admitting: Cardiology

## 2021-05-09 NOTE — Telephone Encounter (Signed)
Stop all medications except the one for his comfort

## 2021-05-09 NOTE — Telephone Encounter (Signed)
From patient.

## 2021-05-09 NOTE — Telephone Encounter (Signed)
Patient wife call about patient who is in a care-facility  and he asked her to call, and she stated in the VM message that the care-facility has taken him off all his medications and he is concerned that he not taking the Xarelto, and wants to know if he needs to go back taking it. Please advise.

## 2021-05-10 ENCOUNTER — Other Ambulatory Visit: Payer: Self-pay | Admitting: Cardiology

## 2021-05-10 ENCOUNTER — Ambulatory Visit: Payer: Medicare Other | Admitting: Cardiology

## 2021-05-10 DIAGNOSIS — I4821 Permanent atrial fibrillation: Secondary | ICD-10-CM

## 2021-05-10 NOTE — Telephone Encounter (Signed)
Spoke with wife, she will relay the message to patient when she goes to visit him at the facility.

## 2021-05-10 NOTE — Telephone Encounter (Signed)
Called patient, NA, no VM-box to leave message.

## 2021-05-13 ENCOUNTER — Telehealth: Payer: Self-pay | Admitting: Medical Oncology

## 2021-05-13 NOTE — Telephone Encounter (Signed)
LVM for Charlene to return my call and confirm if pt under Hospice services and that future appts. Have been cancelled.

## 2021-05-13 NOTE — Telephone Encounter (Deleted)
Pt under Hospice services. [name] be attending?

## 2021-05-20 ENCOUNTER — Ambulatory Visit: Payer: Medicare Other

## 2021-05-20 ENCOUNTER — Other Ambulatory Visit: Payer: Medicare Other

## 2021-05-20 ENCOUNTER — Ambulatory Visit: Payer: Medicare Other | Admitting: Internal Medicine

## 2021-05-20 ENCOUNTER — Encounter: Payer: Medicare Other | Admitting: Nutrition

## 2021-05-21 DIAGNOSIS — L89159 Pressure ulcer of sacral region, unspecified stage: Secondary | ICD-10-CM | POA: Diagnosis not present

## 2021-05-21 DIAGNOSIS — C7989 Secondary malignant neoplasm of other specified sites: Secondary | ICD-10-CM | POA: Diagnosis not present

## 2021-05-21 DIAGNOSIS — I509 Heart failure, unspecified: Secondary | ICD-10-CM | POA: Diagnosis not present

## 2021-05-21 DIAGNOSIS — R627 Adult failure to thrive: Secondary | ICD-10-CM | POA: Diagnosis not present

## 2021-05-21 DIAGNOSIS — R1310 Dysphagia, unspecified: Secondary | ICD-10-CM | POA: Diagnosis not present

## 2021-05-21 DIAGNOSIS — I482 Chronic atrial fibrillation, unspecified: Secondary | ICD-10-CM | POA: Diagnosis not present

## 2021-05-22 ENCOUNTER — Ambulatory Visit: Payer: Medicare Other

## 2021-05-22 DIAGNOSIS — I509 Heart failure, unspecified: Secondary | ICD-10-CM | POA: Diagnosis not present

## 2021-05-22 DIAGNOSIS — C7951 Secondary malignant neoplasm of bone: Secondary | ICD-10-CM | POA: Diagnosis not present

## 2021-05-22 DIAGNOSIS — R1310 Dysphagia, unspecified: Secondary | ICD-10-CM | POA: Diagnosis not present

## 2021-05-22 DIAGNOSIS — C7889 Secondary malignant neoplasm of other digestive organs: Secondary | ICD-10-CM | POA: Diagnosis not present

## 2021-05-22 DIAGNOSIS — I4891 Unspecified atrial fibrillation: Secondary | ICD-10-CM | POA: Diagnosis not present

## 2021-05-22 DIAGNOSIS — C760 Malignant neoplasm of head, face and neck: Secondary | ICD-10-CM | POA: Diagnosis not present

## 2021-05-22 DIAGNOSIS — C7989 Secondary malignant neoplasm of other specified sites: Secondary | ICD-10-CM | POA: Diagnosis not present

## 2021-05-22 DIAGNOSIS — C7800 Secondary malignant neoplasm of unspecified lung: Secondary | ICD-10-CM | POA: Diagnosis not present

## 2021-05-27 DIAGNOSIS — R197 Diarrhea, unspecified: Secondary | ICD-10-CM | POA: Diagnosis not present

## 2021-05-27 DIAGNOSIS — R109 Unspecified abdominal pain: Secondary | ICD-10-CM | POA: Diagnosis not present

## 2021-06-10 ENCOUNTER — Ambulatory Visit: Payer: Medicare Other | Admitting: Physician Assistant

## 2021-06-10 ENCOUNTER — Ambulatory Visit: Payer: Medicare Other

## 2021-06-10 ENCOUNTER — Other Ambulatory Visit: Payer: Medicare Other

## 2021-06-19 DIAGNOSIS — C760 Malignant neoplasm of head, face and neck: Secondary | ICD-10-CM | POA: Diagnosis not present

## 2021-06-19 DIAGNOSIS — R1310 Dysphagia, unspecified: Secondary | ICD-10-CM | POA: Diagnosis not present

## 2021-06-19 DIAGNOSIS — I509 Heart failure, unspecified: Secondary | ICD-10-CM | POA: Diagnosis not present

## 2021-06-19 DIAGNOSIS — C7800 Secondary malignant neoplasm of unspecified lung: Secondary | ICD-10-CM | POA: Diagnosis not present

## 2021-06-19 DIAGNOSIS — C7951 Secondary malignant neoplasm of bone: Secondary | ICD-10-CM | POA: Diagnosis not present

## 2021-06-19 DIAGNOSIS — I4891 Unspecified atrial fibrillation: Secondary | ICD-10-CM | POA: Diagnosis not present

## 2021-06-19 DIAGNOSIS — L0211 Cutaneous abscess of neck: Secondary | ICD-10-CM | POA: Diagnosis not present

## 2021-06-19 DIAGNOSIS — C7989 Secondary malignant neoplasm of other specified sites: Secondary | ICD-10-CM | POA: Diagnosis not present

## 2021-06-29 DIAGNOSIS — L0211 Cutaneous abscess of neck: Secondary | ICD-10-CM | POA: Diagnosis not present

## 2021-06-29 DIAGNOSIS — C7989 Secondary malignant neoplasm of other specified sites: Secondary | ICD-10-CM | POA: Diagnosis not present

## 2021-06-29 DIAGNOSIS — R627 Adult failure to thrive: Secondary | ICD-10-CM | POA: Diagnosis not present

## 2021-06-29 DIAGNOSIS — R1310 Dysphagia, unspecified: Secondary | ICD-10-CM | POA: Diagnosis not present

## 2021-06-29 DIAGNOSIS — I482 Chronic atrial fibrillation, unspecified: Secondary | ICD-10-CM | POA: Diagnosis not present

## 2021-06-29 DIAGNOSIS — I509 Heart failure, unspecified: Secondary | ICD-10-CM | POA: Diagnosis not present

## 2021-06-29 DIAGNOSIS — L03221 Cellulitis of neck: Secondary | ICD-10-CM | POA: Diagnosis not present

## 2021-07-20 DIAGNOSIS — R1310 Dysphagia, unspecified: Secondary | ICD-10-CM | POA: Diagnosis not present

## 2021-07-20 DIAGNOSIS — I509 Heart failure, unspecified: Secondary | ICD-10-CM | POA: Diagnosis not present

## 2021-07-20 DIAGNOSIS — L0211 Cutaneous abscess of neck: Secondary | ICD-10-CM | POA: Diagnosis not present

## 2021-07-20 DIAGNOSIS — C7989 Secondary malignant neoplasm of other specified sites: Secondary | ICD-10-CM | POA: Diagnosis not present

## 2021-07-20 DIAGNOSIS — C7800 Secondary malignant neoplasm of unspecified lung: Secondary | ICD-10-CM | POA: Diagnosis not present

## 2021-07-20 DIAGNOSIS — C760 Malignant neoplasm of head, face and neck: Secondary | ICD-10-CM | POA: Diagnosis not present

## 2021-07-20 DIAGNOSIS — C7951 Secondary malignant neoplasm of bone: Secondary | ICD-10-CM | POA: Diagnosis not present

## 2021-07-20 DIAGNOSIS — I4891 Unspecified atrial fibrillation: Secondary | ICD-10-CM | POA: Diagnosis not present

## 2021-08-19 DIAGNOSIS — L0211 Cutaneous abscess of neck: Secondary | ICD-10-CM | POA: Diagnosis not present

## 2021-08-19 DIAGNOSIS — C7989 Secondary malignant neoplasm of other specified sites: Secondary | ICD-10-CM | POA: Diagnosis not present

## 2021-08-19 DIAGNOSIS — R1310 Dysphagia, unspecified: Secondary | ICD-10-CM | POA: Diagnosis not present

## 2021-08-19 DIAGNOSIS — C7800 Secondary malignant neoplasm of unspecified lung: Secondary | ICD-10-CM | POA: Diagnosis not present

## 2021-08-19 DIAGNOSIS — C760 Malignant neoplasm of head, face and neck: Secondary | ICD-10-CM | POA: Diagnosis not present

## 2021-08-19 DIAGNOSIS — C7951 Secondary malignant neoplasm of bone: Secondary | ICD-10-CM | POA: Diagnosis not present

## 2021-08-19 DIAGNOSIS — I4891 Unspecified atrial fibrillation: Secondary | ICD-10-CM | POA: Diagnosis not present

## 2021-08-19 DIAGNOSIS — I509 Heart failure, unspecified: Secondary | ICD-10-CM | POA: Diagnosis not present

## 2021-08-21 DIAGNOSIS — C760 Malignant neoplasm of head, face and neck: Secondary | ICD-10-CM | POA: Diagnosis not present

## 2021-08-21 DIAGNOSIS — C7800 Secondary malignant neoplasm of unspecified lung: Secondary | ICD-10-CM | POA: Diagnosis not present

## 2021-08-21 DIAGNOSIS — C7951 Secondary malignant neoplasm of bone: Secondary | ICD-10-CM | POA: Diagnosis not present

## 2021-08-21 DIAGNOSIS — C7989 Secondary malignant neoplasm of other specified sites: Secondary | ICD-10-CM | POA: Diagnosis not present

## 2021-08-21 DIAGNOSIS — R1310 Dysphagia, unspecified: Secondary | ICD-10-CM | POA: Diagnosis not present

## 2021-08-21 DIAGNOSIS — L0211 Cutaneous abscess of neck: Secondary | ICD-10-CM | POA: Diagnosis not present

## 2021-08-26 DIAGNOSIS — L0211 Cutaneous abscess of neck: Secondary | ICD-10-CM | POA: Diagnosis not present

## 2021-08-26 DIAGNOSIS — C760 Malignant neoplasm of head, face and neck: Secondary | ICD-10-CM | POA: Diagnosis not present

## 2021-08-26 DIAGNOSIS — C7951 Secondary malignant neoplasm of bone: Secondary | ICD-10-CM | POA: Diagnosis not present

## 2021-08-26 DIAGNOSIS — C7989 Secondary malignant neoplasm of other specified sites: Secondary | ICD-10-CM | POA: Diagnosis not present

## 2021-08-26 DIAGNOSIS — R1310 Dysphagia, unspecified: Secondary | ICD-10-CM | POA: Diagnosis not present

## 2021-08-26 DIAGNOSIS — C7800 Secondary malignant neoplasm of unspecified lung: Secondary | ICD-10-CM | POA: Diagnosis not present

## 2021-08-27 DIAGNOSIS — C7800 Secondary malignant neoplasm of unspecified lung: Secondary | ICD-10-CM | POA: Diagnosis not present

## 2021-08-27 DIAGNOSIS — R1310 Dysphagia, unspecified: Secondary | ICD-10-CM | POA: Diagnosis not present

## 2021-08-27 DIAGNOSIS — C7989 Secondary malignant neoplasm of other specified sites: Secondary | ICD-10-CM | POA: Diagnosis not present

## 2021-08-27 DIAGNOSIS — C760 Malignant neoplasm of head, face and neck: Secondary | ICD-10-CM | POA: Diagnosis not present

## 2021-08-27 DIAGNOSIS — L0211 Cutaneous abscess of neck: Secondary | ICD-10-CM | POA: Diagnosis not present

## 2021-08-27 DIAGNOSIS — C7951 Secondary malignant neoplasm of bone: Secondary | ICD-10-CM | POA: Diagnosis not present

## 2021-09-03 DIAGNOSIS — D61818 Other pancytopenia: Secondary | ICD-10-CM | POA: Diagnosis not present

## 2021-09-03 DIAGNOSIS — C799 Secondary malignant neoplasm of unspecified site: Secondary | ICD-10-CM | POA: Diagnosis not present

## 2021-09-03 DIAGNOSIS — I110 Hypertensive heart disease with heart failure: Secondary | ICD-10-CM | POA: Diagnosis not present

## 2021-09-03 DIAGNOSIS — I5032 Chronic diastolic (congestive) heart failure: Secondary | ICD-10-CM | POA: Diagnosis not present

## 2021-09-04 DIAGNOSIS — C7800 Secondary malignant neoplasm of unspecified lung: Secondary | ICD-10-CM | POA: Diagnosis not present

## 2021-09-04 DIAGNOSIS — C7989 Secondary malignant neoplasm of other specified sites: Secondary | ICD-10-CM | POA: Diagnosis not present

## 2021-09-04 DIAGNOSIS — C760 Malignant neoplasm of head, face and neck: Secondary | ICD-10-CM | POA: Diagnosis not present

## 2021-09-04 DIAGNOSIS — C7951 Secondary malignant neoplasm of bone: Secondary | ICD-10-CM | POA: Diagnosis not present

## 2021-09-04 DIAGNOSIS — L0211 Cutaneous abscess of neck: Secondary | ICD-10-CM | POA: Diagnosis not present

## 2021-09-04 DIAGNOSIS — R1310 Dysphagia, unspecified: Secondary | ICD-10-CM | POA: Diagnosis not present

## 2021-09-11 DIAGNOSIS — C760 Malignant neoplasm of head, face and neck: Secondary | ICD-10-CM | POA: Diagnosis not present

## 2021-09-11 DIAGNOSIS — C7800 Secondary malignant neoplasm of unspecified lung: Secondary | ICD-10-CM | POA: Diagnosis not present

## 2021-09-11 DIAGNOSIS — L0211 Cutaneous abscess of neck: Secondary | ICD-10-CM | POA: Diagnosis not present

## 2021-09-11 DIAGNOSIS — R1310 Dysphagia, unspecified: Secondary | ICD-10-CM | POA: Diagnosis not present

## 2021-09-11 DIAGNOSIS — C7989 Secondary malignant neoplasm of other specified sites: Secondary | ICD-10-CM | POA: Diagnosis not present

## 2021-09-11 DIAGNOSIS — C7951 Secondary malignant neoplasm of bone: Secondary | ICD-10-CM | POA: Diagnosis not present

## 2021-09-19 DIAGNOSIS — C7800 Secondary malignant neoplasm of unspecified lung: Secondary | ICD-10-CM | POA: Diagnosis not present

## 2021-09-19 DIAGNOSIS — R1310 Dysphagia, unspecified: Secondary | ICD-10-CM | POA: Diagnosis not present

## 2021-09-19 DIAGNOSIS — C7951 Secondary malignant neoplasm of bone: Secondary | ICD-10-CM | POA: Diagnosis not present

## 2021-09-19 DIAGNOSIS — L0211 Cutaneous abscess of neck: Secondary | ICD-10-CM | POA: Diagnosis not present

## 2021-09-19 DIAGNOSIS — C760 Malignant neoplasm of head, face and neck: Secondary | ICD-10-CM | POA: Diagnosis not present

## 2021-09-19 DIAGNOSIS — I509 Heart failure, unspecified: Secondary | ICD-10-CM | POA: Diagnosis not present

## 2021-09-19 DIAGNOSIS — I4891 Unspecified atrial fibrillation: Secondary | ICD-10-CM | POA: Diagnosis not present

## 2021-09-19 DIAGNOSIS — C7989 Secondary malignant neoplasm of other specified sites: Secondary | ICD-10-CM | POA: Diagnosis not present

## 2021-09-24 DIAGNOSIS — C7989 Secondary malignant neoplasm of other specified sites: Secondary | ICD-10-CM | POA: Diagnosis not present

## 2021-09-24 DIAGNOSIS — C760 Malignant neoplasm of head, face and neck: Secondary | ICD-10-CM | POA: Diagnosis not present

## 2021-09-24 DIAGNOSIS — C7800 Secondary malignant neoplasm of unspecified lung: Secondary | ICD-10-CM | POA: Diagnosis not present

## 2021-09-24 DIAGNOSIS — L0211 Cutaneous abscess of neck: Secondary | ICD-10-CM | POA: Diagnosis not present

## 2021-09-24 DIAGNOSIS — R1310 Dysphagia, unspecified: Secondary | ICD-10-CM | POA: Diagnosis not present

## 2021-09-24 DIAGNOSIS — C7951 Secondary malignant neoplasm of bone: Secondary | ICD-10-CM | POA: Diagnosis not present

## 2021-10-01 DIAGNOSIS — C7951 Secondary malignant neoplasm of bone: Secondary | ICD-10-CM | POA: Diagnosis not present

## 2021-10-01 DIAGNOSIS — C7989 Secondary malignant neoplasm of other specified sites: Secondary | ICD-10-CM | POA: Diagnosis not present

## 2021-10-01 DIAGNOSIS — L0211 Cutaneous abscess of neck: Secondary | ICD-10-CM | POA: Diagnosis not present

## 2021-10-01 DIAGNOSIS — R1310 Dysphagia, unspecified: Secondary | ICD-10-CM | POA: Diagnosis not present

## 2021-10-01 DIAGNOSIS — C7800 Secondary malignant neoplasm of unspecified lung: Secondary | ICD-10-CM | POA: Diagnosis not present

## 2021-10-01 DIAGNOSIS — C760 Malignant neoplasm of head, face and neck: Secondary | ICD-10-CM | POA: Diagnosis not present

## 2021-10-08 DIAGNOSIS — C760 Malignant neoplasm of head, face and neck: Secondary | ICD-10-CM | POA: Diagnosis not present

## 2021-10-08 DIAGNOSIS — L0211 Cutaneous abscess of neck: Secondary | ICD-10-CM | POA: Diagnosis not present

## 2021-10-08 DIAGNOSIS — R1310 Dysphagia, unspecified: Secondary | ICD-10-CM | POA: Diagnosis not present

## 2021-10-08 DIAGNOSIS — C7800 Secondary malignant neoplasm of unspecified lung: Secondary | ICD-10-CM | POA: Diagnosis not present

## 2021-10-08 DIAGNOSIS — C7989 Secondary malignant neoplasm of other specified sites: Secondary | ICD-10-CM | POA: Diagnosis not present

## 2021-10-08 DIAGNOSIS — C7951 Secondary malignant neoplasm of bone: Secondary | ICD-10-CM | POA: Diagnosis not present

## 2021-10-10 DIAGNOSIS — R1310 Dysphagia, unspecified: Secondary | ICD-10-CM | POA: Diagnosis not present

## 2021-10-10 DIAGNOSIS — C7951 Secondary malignant neoplasm of bone: Secondary | ICD-10-CM | POA: Diagnosis not present

## 2021-10-10 DIAGNOSIS — C7989 Secondary malignant neoplasm of other specified sites: Secondary | ICD-10-CM | POA: Diagnosis not present

## 2021-10-10 DIAGNOSIS — L0211 Cutaneous abscess of neck: Secondary | ICD-10-CM | POA: Diagnosis not present

## 2021-10-10 DIAGNOSIS — C760 Malignant neoplasm of head, face and neck: Secondary | ICD-10-CM | POA: Diagnosis not present

## 2021-10-10 DIAGNOSIS — C7800 Secondary malignant neoplasm of unspecified lung: Secondary | ICD-10-CM | POA: Diagnosis not present

## 2021-10-15 DIAGNOSIS — C7800 Secondary malignant neoplasm of unspecified lung: Secondary | ICD-10-CM | POA: Diagnosis not present

## 2021-10-15 DIAGNOSIS — C7989 Secondary malignant neoplasm of other specified sites: Secondary | ICD-10-CM | POA: Diagnosis not present

## 2021-10-15 DIAGNOSIS — R1310 Dysphagia, unspecified: Secondary | ICD-10-CM | POA: Diagnosis not present

## 2021-10-15 DIAGNOSIS — C760 Malignant neoplasm of head, face and neck: Secondary | ICD-10-CM | POA: Diagnosis not present

## 2021-10-15 DIAGNOSIS — C7951 Secondary malignant neoplasm of bone: Secondary | ICD-10-CM | POA: Diagnosis not present

## 2021-10-15 DIAGNOSIS — L0211 Cutaneous abscess of neck: Secondary | ICD-10-CM | POA: Diagnosis not present

## 2021-10-16 DIAGNOSIS — C7951 Secondary malignant neoplasm of bone: Secondary | ICD-10-CM | POA: Diagnosis not present

## 2021-10-16 DIAGNOSIS — C7800 Secondary malignant neoplasm of unspecified lung: Secondary | ICD-10-CM | POA: Diagnosis not present

## 2021-10-16 DIAGNOSIS — C760 Malignant neoplasm of head, face and neck: Secondary | ICD-10-CM | POA: Diagnosis not present

## 2021-10-16 DIAGNOSIS — C7989 Secondary malignant neoplasm of other specified sites: Secondary | ICD-10-CM | POA: Diagnosis not present

## 2021-10-16 DIAGNOSIS — L0211 Cutaneous abscess of neck: Secondary | ICD-10-CM | POA: Diagnosis not present

## 2021-10-16 DIAGNOSIS — R1310 Dysphagia, unspecified: Secondary | ICD-10-CM | POA: Diagnosis not present

## 2021-10-18 DIAGNOSIS — L0211 Cutaneous abscess of neck: Secondary | ICD-10-CM | POA: Diagnosis not present

## 2021-10-18 DIAGNOSIS — C7800 Secondary malignant neoplasm of unspecified lung: Secondary | ICD-10-CM | POA: Diagnosis not present

## 2021-10-18 DIAGNOSIS — C7951 Secondary malignant neoplasm of bone: Secondary | ICD-10-CM | POA: Diagnosis not present

## 2021-10-18 DIAGNOSIS — C7989 Secondary malignant neoplasm of other specified sites: Secondary | ICD-10-CM | POA: Diagnosis not present

## 2021-10-18 DIAGNOSIS — C760 Malignant neoplasm of head, face and neck: Secondary | ICD-10-CM | POA: Diagnosis not present

## 2021-10-18 DIAGNOSIS — R1310 Dysphagia, unspecified: Secondary | ICD-10-CM | POA: Diagnosis not present

## 2021-10-19 DIAGNOSIS — C7989 Secondary malignant neoplasm of other specified sites: Secondary | ICD-10-CM | POA: Diagnosis not present

## 2021-10-19 DIAGNOSIS — C7951 Secondary malignant neoplasm of bone: Secondary | ICD-10-CM | POA: Diagnosis not present

## 2021-10-19 DIAGNOSIS — I4891 Unspecified atrial fibrillation: Secondary | ICD-10-CM | POA: Diagnosis not present

## 2021-10-19 DIAGNOSIS — I509 Heart failure, unspecified: Secondary | ICD-10-CM | POA: Diagnosis not present

## 2021-10-19 DIAGNOSIS — L0211 Cutaneous abscess of neck: Secondary | ICD-10-CM | POA: Diagnosis not present

## 2021-10-19 DIAGNOSIS — C7800 Secondary malignant neoplasm of unspecified lung: Secondary | ICD-10-CM | POA: Diagnosis not present

## 2021-10-19 DIAGNOSIS — R1310 Dysphagia, unspecified: Secondary | ICD-10-CM | POA: Diagnosis not present

## 2021-10-19 DIAGNOSIS — C760 Malignant neoplasm of head, face and neck: Secondary | ICD-10-CM | POA: Diagnosis not present

## 2021-10-20 DIAGNOSIS — C7800 Secondary malignant neoplasm of unspecified lung: Secondary | ICD-10-CM | POA: Diagnosis not present

## 2021-10-20 DIAGNOSIS — C7951 Secondary malignant neoplasm of bone: Secondary | ICD-10-CM | POA: Diagnosis not present

## 2021-10-20 DIAGNOSIS — C7989 Secondary malignant neoplasm of other specified sites: Secondary | ICD-10-CM | POA: Diagnosis not present

## 2021-10-20 DIAGNOSIS — R1310 Dysphagia, unspecified: Secondary | ICD-10-CM | POA: Diagnosis not present

## 2021-10-20 DIAGNOSIS — L0211 Cutaneous abscess of neck: Secondary | ICD-10-CM | POA: Diagnosis not present

## 2021-10-20 DIAGNOSIS — C760 Malignant neoplasm of head, face and neck: Secondary | ICD-10-CM | POA: Diagnosis not present

## 2021-10-21 DIAGNOSIS — C7951 Secondary malignant neoplasm of bone: Secondary | ICD-10-CM | POA: Diagnosis not present

## 2021-10-21 DIAGNOSIS — L0211 Cutaneous abscess of neck: Secondary | ICD-10-CM | POA: Diagnosis not present

## 2021-10-21 DIAGNOSIS — C7800 Secondary malignant neoplasm of unspecified lung: Secondary | ICD-10-CM | POA: Diagnosis not present

## 2021-10-21 DIAGNOSIS — C760 Malignant neoplasm of head, face and neck: Secondary | ICD-10-CM | POA: Diagnosis not present

## 2021-10-21 DIAGNOSIS — C7989 Secondary malignant neoplasm of other specified sites: Secondary | ICD-10-CM | POA: Diagnosis not present

## 2021-10-21 DIAGNOSIS — R1310 Dysphagia, unspecified: Secondary | ICD-10-CM | POA: Diagnosis not present

## 2021-11-19 DEATH — deceased
